# Patient Record
Sex: Male | Born: 1950 | Race: White | Hispanic: No | Marital: Married | Smoking: Former smoker
Health system: Southern US, Community
[De-identification: ages and names within clinical notes are randomized; demographics above are authoritative.]

## PROBLEM LIST (undated history)

## (undated) DIAGNOSIS — K219 Gastro-esophageal reflux disease without esophagitis: Secondary | ICD-10-CM

## (undated) DIAGNOSIS — I2510 Atherosclerotic heart disease of native coronary artery without angina pectoris: Secondary | ICD-10-CM

## (undated) DIAGNOSIS — Z8719 Personal history of other diseases of the digestive system: Secondary | ICD-10-CM

## (undated) DIAGNOSIS — K2270 Barrett's esophagus without dysplasia: Secondary | ICD-10-CM

## (undated) DIAGNOSIS — I10 Essential (primary) hypertension: Secondary | ICD-10-CM

## (undated) DIAGNOSIS — I209 Angina pectoris, unspecified: Secondary | ICD-10-CM

## (undated) DIAGNOSIS — Z8701 Personal history of pneumonia (recurrent): Secondary | ICD-10-CM

## (undated) DIAGNOSIS — E785 Hyperlipidemia, unspecified: Secondary | ICD-10-CM

## (undated) DIAGNOSIS — J189 Pneumonia, unspecified organism: Secondary | ICD-10-CM

## (undated) DIAGNOSIS — M545 Low back pain: Secondary | ICD-10-CM

## (undated) DIAGNOSIS — Z720 Tobacco use: Secondary | ICD-10-CM

## (undated) DIAGNOSIS — M109 Gout, unspecified: Secondary | ICD-10-CM

## (undated) HISTORY — PX: COLONOSCOPY: SHX174

## (undated) HISTORY — DX: Atherosclerotic heart disease of native coronary artery without angina pectoris: I25.10

## (undated) HISTORY — DX: Hyperlipidemia, unspecified: E78.5

## (undated) HISTORY — DX: Personal history of pneumonia (recurrent): Z87.01

## (undated) HISTORY — PX: UPPER GI ENDOSCOPY: SHX6162

## (undated) HISTORY — DX: Gastro-esophageal reflux disease without esophagitis: K21.9

## (undated) HISTORY — DX: Low back pain: M54.5

## (undated) HISTORY — PX: CARDIAC CATHETERIZATION: SHX172

## (undated) HISTORY — DX: Tobacco use: Z72.0

## (undated) HISTORY — DX: Barrett's esophagus without dysplasia: K22.70

---

## 2006-02-03 DIAGNOSIS — I2510 Atherosclerotic heart disease of native coronary artery without angina pectoris: Secondary | ICD-10-CM

## 2006-02-07 ENCOUNTER — Ambulatory Visit: Payer: Self-pay | Admitting: Cardiology

## 2006-02-07 ENCOUNTER — Inpatient Hospital Stay (HOSPITAL_COMMUNITY): Admission: EM | Admit: 2006-02-07 | Discharge: 2006-02-09 | Payer: Self-pay | Admitting: Cardiology

## 2006-02-21 ENCOUNTER — Ambulatory Visit: Payer: Self-pay | Admitting: Cardiology

## 2006-03-29 ENCOUNTER — Ambulatory Visit: Payer: Self-pay | Admitting: Cardiology

## 2006-05-16 ENCOUNTER — Ambulatory Visit: Payer: Self-pay | Admitting: Cardiology

## 2006-06-09 ENCOUNTER — Ambulatory Visit: Payer: Self-pay | Admitting: Cardiology

## 2006-06-15 ENCOUNTER — Ambulatory Visit: Payer: Self-pay | Admitting: Cardiology

## 2006-06-19 ENCOUNTER — Ambulatory Visit: Payer: Self-pay | Admitting: Cardiology

## 2006-06-20 ENCOUNTER — Ambulatory Visit: Payer: Self-pay | Admitting: Cardiology

## 2006-06-21 ENCOUNTER — Encounter: Payer: Self-pay | Admitting: Cardiology

## 2006-06-21 ENCOUNTER — Ambulatory Visit (HOSPITAL_COMMUNITY): Admission: RE | Admit: 2006-06-21 | Discharge: 2006-06-21 | Payer: Self-pay | Admitting: Cardiology

## 2006-06-21 ENCOUNTER — Ambulatory Visit: Payer: Self-pay | Admitting: Cardiology

## 2006-06-28 ENCOUNTER — Ambulatory Visit: Payer: Self-pay | Admitting: Cardiology

## 2006-07-19 ENCOUNTER — Encounter: Payer: Self-pay | Admitting: Cardiology

## 2006-09-28 ENCOUNTER — Ambulatory Visit: Payer: Self-pay | Admitting: Cardiology

## 2007-01-31 ENCOUNTER — Ambulatory Visit: Payer: Self-pay | Admitting: Cardiology

## 2007-08-08 ENCOUNTER — Ambulatory Visit: Payer: Self-pay | Admitting: Cardiology

## 2008-02-18 ENCOUNTER — Encounter: Payer: Self-pay | Admitting: Cardiology

## 2008-04-07 ENCOUNTER — Ambulatory Visit: Payer: Self-pay | Admitting: Cardiology

## 2008-07-17 ENCOUNTER — Encounter: Payer: Self-pay | Admitting: Cardiology

## 2008-07-24 ENCOUNTER — Encounter: Payer: Self-pay | Admitting: Cardiology

## 2008-07-24 ENCOUNTER — Ambulatory Visit: Payer: Self-pay | Admitting: Cardiology

## 2008-08-21 ENCOUNTER — Encounter: Payer: Self-pay | Admitting: Cardiology

## 2008-10-13 ENCOUNTER — Encounter: Payer: Self-pay | Admitting: Cardiology

## 2008-10-24 ENCOUNTER — Ambulatory Visit: Payer: Self-pay | Admitting: Gastroenterology

## 2008-11-20 ENCOUNTER — Telehealth: Payer: Self-pay | Admitting: Gastroenterology

## 2008-11-25 ENCOUNTER — Encounter: Payer: Self-pay | Admitting: Gastroenterology

## 2008-12-17 ENCOUNTER — Encounter: Payer: Self-pay | Admitting: Gastroenterology

## 2009-06-03 ENCOUNTER — Encounter: Payer: Self-pay | Admitting: Cardiology

## 2009-06-08 ENCOUNTER — Ambulatory Visit: Payer: Self-pay | Admitting: Cardiology

## 2009-06-08 ENCOUNTER — Encounter: Payer: Self-pay | Admitting: Physician Assistant

## 2010-01-14 ENCOUNTER — Encounter: Payer: Self-pay | Admitting: Cardiology

## 2010-06-29 ENCOUNTER — Telehealth (INDEPENDENT_AMBULATORY_CARE_PROVIDER_SITE_OTHER): Payer: Self-pay | Admitting: *Deleted

## 2010-07-12 ENCOUNTER — Encounter: Payer: Self-pay | Admitting: Cardiology

## 2010-07-14 ENCOUNTER — Encounter (INDEPENDENT_AMBULATORY_CARE_PROVIDER_SITE_OTHER): Payer: Self-pay | Admitting: *Deleted

## 2010-07-14 ENCOUNTER — Ambulatory Visit: Payer: Self-pay | Admitting: Cardiology

## 2010-10-05 NOTE — Progress Notes (Signed)
Summary: pain meds question  Phone Note Call from Patient   Caller: VOICEMAIL MESSAGE Summary of Call: Called questioning what else he can take besides Tylenol for back pain.  PMD given injection.  Also questioning if he can take Skelaxin.  Okay to leave message.    Notified via voice mail - MD will not advise on meds since not seen since 06/2009.  Can address at OV on 11/9.   Hoover Brunette, LPN  June 29, 2010 4:52 PM

## 2010-10-05 NOTE — Letter (Signed)
Summary: Stress Echocardiography  [facility] [address]   [place], [place] [address]   Phone: [phone]  Fax: [phone]      [facility] Cardiovascular Services  Stress Echocardiography    Letitia Libra  Appointment Date:_  Appointment Time:_   Your doctor has ordered a stress echo to help determine the condition of your heart during exercise. If you take blood pressure medication, ask your doctor if you should take it the day of your test. You should not have anything to eat or drink at least 4 hours before your test is scheduled.  You will be asked to undress from the waist up and given a hospital gown to wear, so dress comfortably from the waist down for example: Sweat pants, shorts, or skirt Rubber soled lace up shoes (tennis shoes)  You will need to register at the Outpatient/Main Entrance at the hospital 15 minutes before your appointment time. It is a good idea to bring a copy of your order with you. They will direct you to the Cardiovascular Department on the third floor.   Plan on about an hour and a half  from registration to release from the hospital

## 2010-10-05 NOTE — Assessment & Plan Note (Signed)
Summary: 1 YR FU PER OCT REMINDER -SRS   Visit Type:  Follow-up Primary Provider:  Allyson Sabal   History of Present Illness: patient had laboratory 2011. Creatinine is within normal limits.liver function tests are within normal limits. Cholesterol is 195, HDL is 55 LDL is 116potassium mildly elevated at 5.2. CBC is within normal limits and PSA was normal. patient previously had  Lyme serology done and recommend and both IgM,r IgG both were negative. patient has a history of coronary artery disease, with Cypher stent to the LAD and the diagonal lesion that's treated medically. Ecg OK nsr no changes.  increaseCrestor 20mg . Rd shift leg cramps. Standing on feet. Night cramps.  Stent placed 02/2006 Back pain. Now on Naproxen.  Sweating at night. TSH normal.  No scp. No sob.  Needs stress echo.  Will get back to [place]. Change amlodipine to qod. Going to stay with it.  Preventive Screening-Counseling & Management  Alcohol-Tobacco     Smoking Status: quit     Year Quit: 2000  Current Medications (verified): 1)  Metoprolol Succinate 50 Mg Xr24h-Tab (Metoprolol Succinate) .... One Tablet P.o. Daily 2)  Amlodipine Besylate 10 Mg Tabs (Amlodipine Besylate) .... One Tablet P.o. Daily 3)  Trilipix 135 Mg Cpdr (Choline Fenofibrate) .... Take 1 Capsule By Mouth Once A Day 4)  Nexium 40 Mg Cpdr (Esomeprazole Magnesium) .... Take 1 Tablet By Mouth Once A Day 5)  Crestor 20 Mg Tabs (Rosuvastatin Calcium) .... Take 1 Tab By Mouth At Bedtime 6)  Aspirin 81 Mg Tbec (Aspirin) .... Take 1 Tablet By Mouth Once A Day 7)  Fish Oil 1000 Mg Caps (Omega-3 Fatty Acids) .... Take 2 Tablet By Mouth Two Times A Day 8)  Multivitamins  Tabs (Multiple Vitamin) .... Take 1 Tablet By Mouth Once A Day 9)  Naproxen 500 Mg Tabs (Naproxen) .... Take 1 Tablet By Mouth Two Times A Day  Allergies (verified): No Known Drug Allergies  Comments:  Nurse/Medical Assistant: The patient's medication bottles and allergies  were reviewed with the patient and were updated in the Medication and Allergy Lists.  Past History:  Past Medical History: Last updated: 07/24/2008 coronary artery disease status post Cypher stent to the LAD with high-grade diagonal lesion treated medically normal LV function question statin intolerance hypertension resolved severe hypertriglyceridemia history of tobacco use  Family History: Last updated: 07/24/2008 noncontributory  Social History: Last updated: 07/24/2008 history of alcohol use and tobacco use  Risk Factors: Smoking Status: quit (07/14/2010)  Review of Systems  The patient denies fatigue, malaise, fever, weight gain/loss, vision loss, decreased hearing, hoarseness, chest pain, palpitations, prolonged cough, wheezing, sleep apnea, coughing up blood, abdominal pain, blood in stool, nausea, vomiting, diarrhea, heartburn, incontinence, blood in urine, muscle weakness, joint pain, leg swelling, rash, skin lesions, headache, fainting, dizziness, depression, anxiety, enlarged lymph nodes, easy bruising or bleeding, and environmental allergies.    Vital Signs:  Patient profile:   60 year old male Height:      70 inches Weight:      192 pounds BMI:     27.65 Pulse rate:   65 / minute BP sitting:   127 / 77  (left arm) Cuff size:   regular  Vitals Entered By: Carlye Grippe (July 14, 2010 9:58 AM)  Nutrition Counseling: Patient's BMI is greater than 25 and therefore counseled on weight management options.  Physical Exam  Additional Exam:  General: Well-developed, well-nourished in no distress head: Normocephalic and atraumatic eyes PERRLA/EOMI intact, conjunctiva  and lids normal nose: No deformity or lesions mouth normal dentition, normal posterior pharynx neck: Supple, no JVD.  No masses, thyromegaly or abnormal cervical nodes lungs: Normal breath sounds bilaterally without wheezing.  Normal percussion heart: regular rate and rhythm with normal S1 and  S2, no S3 or S4.  PMI is normal.  No pathological murmurs abdomen: Normal bowel sounds, abdomen is soft and nontender without masses, organomegaly or hernias noted.  No hepatosplenomegaly musculoskeletal: Back normal, normal gait muscle strength and tone normal pulsus: Pulse is normal in all 4 extremities Extremities: No peripheral pitting edema neurologic: Alert and oriented x 3 skin: Intact without lesions or rashes cervical nodes: No significant adenopathy psychologic: Normal affect    Impression & Recommendations:  Problem # 1:  ESSENTIAL HYPERTENSION, BENIGN (ICD-401.1) blood pressure well-controlled. The patient wanted to stop his amlodipine but I told him that this is not a good idea and ultimately he will continue this. His updated medication list for this problem includes:    Metoprolol Succinate 50 Mg Xr24h-tab (Metoprolol succinate) ..... One tablet p.o. daily    Amlodipine Besylate 10 Mg Tabs (Amlodipine besylate) ..... One tablet p.o. daily    Aspirin 81 Mg Tbec (Aspirin) .Marland Kitchen... Take 1 tablet by mouth once a day  Problem # 2:  PERCUTANEOUS TRANSLUMINAL CORONARY ANGIOPLASTY, HX OF (ICD-V45.82) the patient had a stent placed in 2007. This was a Cypher drug-eluting stent. He is due for a stress echocardiogram which will be scheduled in the next couple of weeks.  Problem # 3:  MIXED HYPERLIPIDEMIA (ICD-272.2) patient's LDL is not at goal. It is 528. We will increase Crestor to 20 mg p.o. q. daily. Followup liver function test and lipid panel in 6 months. His updated medication list for this problem includes:    Trilipix 135 Mg Cpdr (Choline fenofibrate) .Marland Kitchen... Take 1 capsule by mouth once a day    Crestor 20 Mg Tabs (Rosuvastatin calcium) .Marland Kitchen... Take 1 tab by mouth at bedtime  Patient Instructions: 1)  Stress Echo - call the office when ready to do test. 2)  Follow up in  6 months

## 2010-12-18 ENCOUNTER — Telehealth: Payer: Self-pay | Admitting: *Deleted

## 2010-12-18 DIAGNOSIS — I10 Essential (primary) hypertension: Secondary | ICD-10-CM

## 2010-12-18 NOTE — Telephone Encounter (Signed)
Lynden Ang (wife) calling for husband.  States wants to see Dr. Andee Lineman.  Husband having new problem with edema in ankles and feet.  Left message to return call on Monday.

## 2010-12-20 NOTE — Telephone Encounter (Signed)
Spoke with wife Lynden Ang).  States having edema from knees down x 3 weeks.  No SOB.  Does notice fatique more over last 3 weeks, but has also started working 10 hour days.  Wife stated that she has been giving him some of her HCTZ 25mg  - 2 tabs daily x 1 week and just changed to one tab daily recently.  Last seen in November.  Was suppose to call back when able to do stress echo, but has not had this done yet.  Had been having some back issues & not sure he could do the treadmill part of test.

## 2010-12-22 NOTE — Telephone Encounter (Signed)
Not much we can do over the phone. Patient can be scheduled for a dobutamine echocardiogram so he doesn't have to walk. He was seen in November for his appointment should be coming up soon.

## 2010-12-22 NOTE — Telephone Encounter (Addendum)
Wife Lynden Ang) notified in regards to scheduling test & then seeing MD.  I informed pt that i can not add anymore pt's to MD schedule without his approval.  Stated that she felt like husband should be able to see his cardiologist anytime they needed him.  Offered to go ahead and have dobutamine echo scheduled first, the MD could decide how to treat from there.  Wife declined test & OV and stated she would find another practice with more providers in it so he could be seen when he felt like he should be.  Wife was then transferred to discuss this further with our office manager Joanna Puff).    After call above, wife did call back to request written refill for Metoprolol ER 50mg  & Amlodipine 10mg .  Needs them to be 90 day supply for mail order.  Advised her that we would give one time (#90 tabs) only since she would be leaving the practice.  She verbalized understanding.  Will notify when she can pick up as she requests to mail herself instead of [place] sending them in electronically.

## 2010-12-23 MED ORDER — METOPROLOL SUCCINATE ER 50 MG PO TB24: 50.0000 mg | ORAL_TABLET | Freq: Every day | ORAL | Status: DC

## 2010-12-23 MED ORDER — AMLODIPINE BESYLATE 10 MG PO TABS: 10.0000 mg | ORAL_TABLET | Freq: Every day | ORAL | Status: DC

## 2011-01-18 NOTE — Assessment & Plan Note (Signed)
[facility]                          [facility] NOTE   NAME:[name]                     MRN:          [id number]  DATE:08/08/2007                            DOB:          1951/08/14    HISTORY OF PRESENT ILLNESS:  Patient is a 60 year old male with single-  vessel coronary artery disease.  Patient is status post stent placement  to the LAD in 2007.  He also has a high-grade diagonal lesion, which was  treated medically.  However, he has had no recurrence of chest pain and  is doing quite well.  He had lipid panel drawn recently and LDL was  decreased to 122 and Crestor 5 mg a day.  However, triglycerides were  increased to 248.  The latter is likely attributable to the fact that  patient started eating more carbohydrates again.  He also has not  exercised in quite a while.   MEDICATIONS:  1. Amlodipine 5 mg p.o. daily.  2. Aspirin 81 mg p.o. daily.  3. Crestor 5 mg p.o. daily.  4. Plavix 75 mg p.o. daily.  5. Nexium 40 mg p.o. daily.  6. Tricor 145 daily.  7. Fish oil 1000 mg twice a day.   PHYSICAL EXAMINATION:  VITAL SIGNS:  Blood pressure 145/93, heart rate  78 beats per minute, weight 186 pounds.  NECK EXAM:  Normal carotid upstroke, no carotid bruits.  LUNGS:  Clear breath sounds bilaterally.  HEART:  Regular rate and rhythm, normal S1, S2, no murmurs or gallops.  ABDOMEN:  Soft.  EXTREMITY EXAM:  No cyanosis, clubbing or edema.   PROBLEM LIST:  1. Coronary artery disease.      a.     No recurrence of substernal chest pain.      b.     Status post Cypher stent to the LAD with high-grade diagonal       lesion, treated medically.      c.     Normal LV function.  2. Tolerating statin drug therapy.  3. Hypertension, resolved.  4. Severe hypertriglyceridemia, on fish oil.  Add Tricor and low-carb      diet (noncompliant with __________ ).  5. History of tobacco abuse.   PLAN:  1. We discussed the patient's lipid  profile.  We are reducing Crestor      to 10 mg p.o. daily.  We have also asked him to exercise to lower      his triglycerides.  Also increased his fish oil to 40 g a day.  2. Patient, from a cardiovascular perspective, is stable and will      follow up with [place] in the clinic in      six months.  3. Lipid panel will be drawn in eight weeks.     Learta Codding, MD,FACC  Electronically Signed    GED/MedQ  DD: 08/08/2007  DT: 08/08/2007  Job #: [id number]

## 2011-01-18 NOTE — Assessment & Plan Note (Signed)
[facility]                          [facility]ICE NOTE   [name], [name]                     MRN:          [id number]  DATE:04/07/2008                            DOB:          1950-09-07    PRIMARY CARE PHYSICIAN:  Doreen Beam, MD   PRIMARY CARDIOLOGIST:  Learta Codding, MD, FACC   REASON FOR VISIT:  Scheduled followup.   HISTORY OF PRESENT ILLNESS:  I am seeing [name] in Dr. Margarita Mail  absence.  He is a pleasant 60 year old gentleman with coronary artery  disease status post drug-eluting stent placement to the left anterior  descending with medically managed high-grade stenosis involving a  diagonal branch.  He is doing well from the perspective of symptoms with  no significant angina or dyspnea on exertion.  His main complaint has  been a joint pain.  He has ultimately determined that it is likely  related to his Crestor.  He even had blood work done back in April to  investigate inflammatory arthritis, but had a normal RA factor and  normal ANA.  His sed rate was also normal at 2.  He has already backed  down his Crestor dose from 10 mg to 5 mg back in June and did have  followup blood work at that time showing normal liver function tests  with a total cholesterol of 189, triglycerides of 64, HDL 64, and LDL  112.  Actually, most of these numbers are much better following a  decrease in his carbohydrates as well as increase in omega-3  supplements.  His LDL unfortunately is not at goal, although better than  it was.  We talked about this some today.  He previously did not  tolerate Vytorin.   ALLERGIES:  No known drug allergies.   PRESENT MEDICATIONS:  1. Aspirin 81 mg p.o. daily.  2. Amlodipine 5 mg p.o. daily.  3. Plavix 75 mg p.o. daily.  4. Nexium 40 mg p.o. daily.  5. TriCor 145 mg p.o. daily.  6. Omega-3 supplements 2 g p.o. b.i.d.  7. Crestor 5 mg p.o. daily.  8. Sublingual nitroglycerin 0.4 mg p.r.n.   REVIEW  OF SYSTEMS:  As described in history of present illness.  Otherwise negative.   PHYSICAL EXAMINATION:  VITAL SIGNS:  Blood pressure is 134/80, heart  rate is 78, and weight is 174 pounds.  GENERAL:  The patient is comfortable and in no acute distress.  HEENT:  Conjunctivae are normal.  Oropharynx is clear.  NECK:  Supple.  No elevated jugular venous pressure.  No loud bruits or  thyromegaly.  LUNGS:  Clear without labored breathing at rest.  CARDIAC:  Regular rate and rhythm.  No rub, murmur, or gallop.  ABDOMEN:  Soft and nontender.  Normoactive bowel sounds.  EXTREMITIES:  Exhibits no significant pitting edema.  Distal pulses are  2+.  SKIN:  Warm and dry.  MUSCULOSKELETAL:  No kyphosis noted.  NEUROPSYCHIATRIC:  The patient is alert and oriented x3.  Affect is  appropriate.   IMPRESSION AND RECOMMENDATIONS:  1. Cardiovascular disease status post drug-eluting stent placement to  the proximal left anterior descending with medically managed high-      grade diagonal stenosis, symptomatically very stable on medical      therapy.  Left ventricular ejection fraction has been assessed in      the normal range.  At this point, we will plan to continue present      antianginal regimen and risk factor modification strategies.  2. Hyperlipidemia with history of intolerance to Vytorin and now      Crestor, recent symptoms being predominately joint discomfort.  We      talked about this a fair bit today.  He will be transitioned from      TriCor to Trilipix 135 mg daily, continue on 4 g of omega-3      supplements daily, and after a 2-week statin hiatus will begin      Lipitor 20 mg p.o. nightly instead of Crestor.  Hopefully, he will      be able to tolerate this.  If so, he will have a followup lipid      profile and liver function tests over the next 8-12 weeks with Dr.      Sherril Croon, and we can reassess from there.  3. Regular followup will be with Dr. Andee Lineman over the next 6  months.     Jonelle Sidle, MD  Electronically Signed    SGM/MedQ  DD: 04/07/2008  DT: 04/08/2008  Job #: [id number]   cc:   Doreen Beam, MD  Learta Codding, MD,FACC

## 2011-01-18 NOTE — Consult Note (Signed)
NAME:  [name], [name] NO.:  [id number]   MEDICAL RECORD NO.:  [id number]          PATIENT TYPE:  AMB   LOCATION:  DAY                           FACILITY:  [facility]   PHYSICIAN:  Kassie Mends, M.D.      DATE OF BIRTH:  August 01, 1951   DATE OF CONSULTATION:  10/24/2008  DATE OF DISCHARGE:                                 CONSULTATION   REASON FOR CONSULTATION:  Dysphagia.   PHYSICIAN REQUESTING CONSULTATION:  Doreen Beam, MD   HISTORY OF PRESENT ILLNESS:  The patient is a very pleasant 60 year old  gentleman who complains of a several-week history of dysphagia primarily  the pills.  He states he feels his pills are getting stuck in his upper  esophagus.  He notes one particular pill seems to cause him most  problems, but he cannot recall the name of it.  He believes this is  blood pressure pill.  He denies any pain in the area.  He also complains  of some sinusitis issues and wonders if this is causing him to have a  sensation.  When he eats the food, food seems to go down okay.  He is on  chronic Nexium since he has been on Plavix 2 years ago.  He denies any  heartburn, nausea, vomiting, abdominal pain, constipation, diarrhea,  melena, or rectal bleeding.   CURRENT MEDICATIONS:  1. Plavix 75 mg daily.  2. Nexium 40 mg daily.  3. Norvasc 10 mg daily.  4. Metoprolol 50 mg daily.  5. Crestor 10 mg daily.  6. Trilipix 135 mg daily.  7. Aspirin 81 mg daily.  8. Fish oil 2 b.i.d.  9. Carafate q.i.d.   ALLERGIES:  No known drug allergies.   PAST MEDICAL HISTORY:  GERD, hypercholesterolemia, CAD status post stent  2 years ago, colonoscopy a year ago by Dr. Karilyn Cota, states he had a small  polyp and was told to come back in 6-7 years.   FAMILY HISTORY:  Significant for CAD.  No family history of colorectal  cancer.   SOCIAL HISTORY:  He is married.  He currently is laid off from [employer]  in [place].  He is a nonsmoker, occasionally consumes beer.   REVIEW OF SYSTEMS:   See HPI for GI.  Constitutional:  No weight loss.  Cardiopulmonary:  No chest pain, shortness of breath, palpitations, or  cough.  Genitourinary:  No dysuria or hematuria.   PHYSICAL EXAMINATION:  VITAL SIGNS:  Weight 188, height 5 feet 10  inches, temp 98.2, blood pressure 158/80, and pulse 80.  GENERAL:  A pleasant well-nourished, well-developed Caucasian gentleman  in no acute distress.  SKIN:  Warm and dry.  No jaundice.  HEENT:  Sclerae nonicteric.  Oropharyngeal mucosa moist and pink.  No  lesions, erythema, or exudate.  No lymphadenopathy or thyromegaly.  CHEST:  Lungs are clear to auscultation.  CARDIAC:  Regular rate and rhythm.  No murmurs, rubs, or gallops.  ABDOMEN:  Positive bowel sounds.  Abdomen is soft, nontender, and  nondistended.  No organomegaly or masses.  No rebound or guarding.  No  abdominal bruits or hernias.  LOWER EXTREMITIES:  No edema.   IMPRESSION:  [name] is a 60 year old gentleman with a several-week  history of dysphagia primarily to pills.  He feels like pills are  getting stuck in his upper esophagus.  He denies any odynophagia.  He  also complains of sinusitis.  It may be that we are dealing with more or  less globus hystericus, but cannot exclude upper esophageal web or  Zenker diverticulum, etc.   PLAN:  1. EGD by Dr. Cira Servant in the near future.  We will have him hold his      Plavix and aspirin for 4 days prior to procedure.  2. Further recommendations to follow.  3. I would like to thank Dr. Sherril Croon for allowing [place] to take part in the      care of this patient.   ADDENDUM 84696:  EGD not done as of 31810. Will call pt to [facility].      Tana Coast, P.A.      Kassie Mends, M.D.  Electronically Signed    LL/MEDQ  D:  10/24/2008  T:  10/25/2008  Job:  [id number]   cc:   Doreen Beam, MD  Fax: [phone]

## 2011-01-18 NOTE — Assessment & Plan Note (Signed)
[facility]                          [facility]ICE NOTE   NAME:[name], [name]                     MRN:          [id number]  DATE:01/31/2007                            DOB:          [birth year]    HISTORY OF PRESENT ILLNESS:  Patient is a 60 year old male with history  of single vessel coronary artery disease.  The patient presents for  followup.  He has been doing quite well.  He reports no substernal chest  pain, shortness of breath, orthopnea, or PND.  He is angina free now  that he is taking the addition of amlodipine.  He does have high-grade  residual lesion in the diagonal branch with a patent stent to the LAD.  The patient has placed himself on a low-carb diet, and his triglycerides  are now within normal range; it measures at 104 mg%.  His LDL is  elevated at 168.  However, previously due to his very high  triglycerides, his LDL was not measured.   MEDICATIONS:  1. Tricor 145 mg p.o. daily.  2. Plavix 75 mg p.o. daily.  3. Fish oil 1000 mg p.o. daily.  4. Nexium 40 mg p.o. daily.  5. Aspirin 81 mg a day.  6. Amlodipine 5 mg p.o. daily.   PHYSICAL EXAMINATION:  VITAL SIGNS:  Blood pressure is 131/85.  Heart  rate 63.  He weighs 180 pounds.  NECK EXAM:  Normal carotid upstroke.  No carotid bruits.  LUNGS:  Clear breath sounds bilaterally.  HEART:  Regular rate and rhythm with normal S1 and S2.  No murmurs,  rubs, or gallops.  ABDOMEN:  Soft and non-tender.  No rebound or guarding.  EXTREMITY EXAM:  No cyanosis, clubbing, or edema.  NEUROLOGIC:  Patient is alert, oriented, and grossly nonfocal.   PROBLEM LIST:  1. Coronary artery disease.      a.     No recurrence of substernal chest pain.      b.     Status post Cypher stent to the left anterior descending       with residual high-grade lesion in the diagonal branch, treated       medically.  2. Previous weakness secondary to high-dose statin therapy.  3. Hypertension,  resolved.  4. Severe hypertriglyceridemia, resolved on fish oil, Tricor, and low-      carb diet.  5. History of tobacco use.  6. History of __________ .   PLAN:  1. Patient is doing extremely well.  He has no recurrent angina.  2. We started a prescription of low-dose Crestor 5 mg p.o. daily.  3. The patient will have followup labs done in 3 to 6 months.     Learta Codding, MD,FACC  Electronically Signed    GED/MedQ  DD: 01/31/2007  DT: 01/31/2007  Job #: [id number]

## 2011-01-21 NOTE — Cardiovascular Report (Signed)
NAME:  CREEK, GAN NO.:  [id number]   MEDICAL RECORD NO.:  [id number]          PATIENT TYPE:  INP   LOCATION:  2807                         FACILITY:  [facility]   PHYSICIAN:  Arturo Morton. Riley Kill, M.D. [facility] OF BIRTH:  03-23-1951   DATE OF PROCEDURE:  02/08/2006  DATE OF DISCHARGE:                              CARDIAC CATHETERIZATION   INDICATIONS:  Mr. Travaughn Vue is a 60 year old gentleman, father of  one, a weaver for [employer], who presented with negative troponins.  The patient had had some recent chest pain.  It was felt to be recent onset  unstable angina, and he had significant hypertriglyceridemia.  Based on  this, Dr. Myrtis Ser saw the patient and referred him for cardiac catheterization.   PROCEDURES:  1.  Left heart catheterization.  2.  Selective coronary arteriography.  3.  Selective left ventriculography.  4.  Percutaneous coronary intervention using a drug-eluting stent in the      left anterior descending artery.   DESCRIPTION OF PROCEDURE:  The patient was brought to the catheterization  laboratory and prepped and draped in the usual fashion.  Through an anterior  puncture, the right femoral artery was easily entered.  We initially used 5-  [place] catheters.  Views of the left and right coronaries were obtained in  multiple angiographic projections.  Central aortic and left ventricular  pressures were measured with the pigtail.  Ventriculography was performed in  the RAO projection.  The patient had evidence of high-grade disease  involving an important left anterior descending artery, compatible with his  unstable angina.  He had mild irregularities of the circumflex and normal  right coronary artery.  His overall LV function was well-preserved.  It then  discussed with the patient in some detail the approach.  We discussed  percutaneous intervention and/or revascularization surgery.  I also  discussed this with his wife.  We also  discussed, as reasonably as could be  achieved, the choice of the stents with both drug-eluting a non drug-eluting  platforms.  Given the patient's LAD, reasonable length and somewhat smaller  caliber vessel, it was felt that in this situation a drug-eluting stent  would be the most ideal option if it could be achieved, given the small size  of the vessel.  We discussed this in detail, and we elected to proceed.   The patient was given oral clopidogrel, at first 300 mg, then followed by an  additional 300 mg.  Following this, bivalirudin was given according to  protocol.  The 5-French sheath was upgraded to a 6-French sheath.  A JL-30  guiding catheter was utilized.  We were able to get a Prowater wire down the  vessel, and predilatations were done with both 20 and 2.25 mm balloons.  We  then carefully debated whether or not to lay a drug-eluting stent across the  side branch.  The side branch was not significantly compromised, but the  angle was not steep.  With this, a 28 x 2.5 Cypher drug-eluting stent was  placed in the left anterior descending artery.  This was then  deployed  carefully at 14 atmospheres.  There was dramatic improvement in the  appearance of the artery.  There was a change in the side branch from about  30% to about 70% narrowing at the ostium, but he maintained good TIMI III  flow, and the side branch itself it is not nearly the size of the left  anterior descending artery.  We elected to post dilate the stent then with a  Quantum Maverick 2.75-mm balloon.  The distal-most aspect of the stent  overlying the side branch was dilated up to about 8-10 atmospheres.  As the  balloon was pulled back into the stent that was not covering the side  branch, we deployed at about 12-14 atmospheres with the post dilatation  balloon.  There was really marked improvement in the appearance of the  artery and what appeared to be an excellent angiographic result.  There was  TIMI III  flow.  There was no evidence of hematoma.  All catheters were  subsequently removed and the femoral sheath was sewn into place.  He was  taken to the holding area for subsequent transferred to the post angioplasty  care unit.  I reviewed the films with his wife in detail.   HEMODYNAMIC DATA:  1.  Central aortic pressure 124/73.  2.  Left ventricular pressure 122/12.  3.  No gradient pullback across aortic valve.   ANGIOGRAPHIC DATA:  1.  Ventriculography was done in the RAO projection.  Overall systolic      function was preserved.  No segmental abnormalities or contraction were      identified.  2.  The right coronary artery was free of significant disease of the large      caliber vessel providing posterior descending and a tiny posterolateral      system.  3.  The left main coronary artery was free of critical disease.  4.  The left anterior descending artery provides a first diagonal.  It is      somewhat small in caliber.  Just after this, there is about 70%      narrowing and then subtotal occlusion just prior to the diagonal      takeoff.  The diagonal takeoff itself has about 30% narrowing, and the      area in the LAD just distal to this has probably from 40-50% narrowing,      although it is somewhat difficult to gauge, given the bifurcation nature      of the lesion.  The distal LAD wraps the apical tip.  5.  The circumflex is a fairly large vessel with about 30% proximal      narrowing and then a marginal branch with about 20-30% proximal      narrowing in the AV circumflex without critical disease.  Following the      stent procedure, the segmental area of disease in the left anterior      descending artery is reduced to 0% with an excellent angiographic      appearance.  There is some pinching of the side branch from about 30% up      to about 70%, but there is maintained TIMI III flow without critical     narrowing or slow flow.  Based on this, it was felt that parent  vessel      technique was the best option and subsequent dilatation of the diagonal      not needed.   CONCLUSION:  1.  Successful percutaneous  stenting of subtotally occluded left anterior      descending artery in the setting of highly unstable angina using a drug-      eluting platform.  2.  Well-preserved overall left ventricular function.  3.  Significant hypertriglyceridemia.   DISPOSITION:  1.  Recommendations for aggressive approach to the hypertriglyceridemia.  2.  Aspirin and Plavix for a minimum of 1 year, and then at the discretion      of the primary cardiologist thereafter, with continued aspirin use.  3.  Aggressive risk factor reduction.      Arturo Morton. Riley Kill, M.D. [facility]  Electronically Signed     TDS/MEDQ  D:  02/08/2006  T:  02/09/2006  Job:  [id number]   cc:   Willa Rough, M.D.  [address]  [address]  [place]  [place] [address]   CV Laboratory   Doreen Beam  Fax: [phone]

## 2011-01-21 NOTE — Assessment & Plan Note (Signed)
[facility]                            [facility] OFFICE NOTE   Theador, [name]                     MRN:          [id number]  DATE:06/09/2006                            DOB:          1950-12-01    PRIMARY CARDIOLOGIST:  Luis Abed, MD, [facility]   REASON FOR OFFICE VISIT:  Scheduled follow-up.   [name] returns following most recent post hospital visit on June 19, at  which time he returned to Roxanne Mins, PA-C, after undergoing cardiac  catheterization revealing subtotal occlusion of the LAD in the setting of  unstable angina pectoris.  He was treated with Cypher stenting.  Left  ventricular function was normal.   Recommendation was to treat with Plavix for at least 1 year.   The patient also is noted to have significant hypertriglyceridemia - he was  on both Tricor/Fish oil, and has since been placed on Vytorin by myself for  more aggressive management of LDL.   Since last seen, the patient continues to report no exertional angina  pectoris as he had prior to his catheterization.  However, he has been  plagued by significant fatigue and diminished energy.  He also has been  having difficulty with impotence.   The patient has not smoked in 7 years.   CURRENT MEDICATIONS:  1. Plavix.  2. Aspirin 325 daily.  3. Lopressor 25 b.i.d.  4. TriCor 145 daily.  5. Fish Oil 1000 mg daily.  6. Nexium.   PHYSICAL EXAMINATION:  VITAL SIGNS:  Blood pressure 102/62, pulse 60 and  regular, weight 178.  GENERAL:  A 60 year old male in no apparent distress.  HEENT:  Normocephalic and atraumatic.  NECK:  Palpable bilateral carotid pulses without bruits.  LUNGS:  Clear to auscultation in all fields.  HEART:  Regular rate and rhythm (S1 and S2).  No significant murmurs.  EXTREMITIES:  Palpable distal pulses without edema.  NEUROLOGY:  No focal deficits.   IMPRESSION:  1. Single vessel coronary artery disease.      a.     Status post  Cypher stenting subtotal occlusion left anterior       descending June 2007.      b.     Normal left ventricular function.  2. Fatigue.      a.     Suspect secondary to hypotension.  3. Hypotension.  4. Severe hypertriglyceridemia.      a.     On Fish Oil/TriCor and Vytorin.  5. History of tobacco.  6. Impotence.   PLAN:  1. Decrease Lopressor to 12.5 b.i.d. to allow both the blood pressure and      the pulse to increase.  I suspect that this will greatly improve his      current lack of energy and easy fatigability.  This may also help      ameliorate his current symptoms with probable impotence.  Of note, I      will probably wean him off the beta blocker given that there is no      objective data for him to be on longterm beta  blocker.  2. Schedule a follow-up fasting lipid/liver profile given the recent      addition of Vytorin, following my review of his lipid profile in June      of this year.  3. Decrease aspirin to 81 mg daily.  The patient does report some easy      bruising on the combination of full dose aspirin/Plavix.  However, he      does know that he is to remain on low dose aspirin indefinitely and to      continue on Plavix for at least 1 year.  4. Return to clinic to follow up with me in 2 weeks for reassessment of      his symptoms, blood pressure, and heart rate, as well as review of his      lipid profile.      ______________________________  Rozell Searing, PA-C    ______________________________  Learta Codding, MD,FACC    GS/MedQ  DD:  06/09/2006  DT:  06/12/2006  Job #:  [id number]   cc:   Doreen Beam

## 2011-01-21 NOTE — Cardiovascular Report (Signed)
NAME:  [name], DELILLO NO.:  [id number]   MEDICAL RECORD NO.:  [id number]          PATIENT TYPE:  OIB   LOCATION:  2899                         FACILITY:  [facility]   PHYSICIAN:  Arturo Morton. Riley Kill, MD, FACCDATE OF BIRTH:  13-Feb-1951   DATE OF PROCEDURE:  06/21/2006  DATE OF DISCHARGE:  06/21/2006                              CARDIAC CATHETERIZATION   INDICATIONS:  Mr. Bialy is a very delightful 60 year old well-known to me.  He underwent percutaneous stenting of the left anterior descending artery.  At that time, the patient had some modest compromise particularly of the  second diagonal.  However, it was not flow limiting and the decision was  made to treat the parent vessel and not do bifurcation stenting because of  obvious reasons.  With this, the patient has done reasonably well until  recently.  Because of fatigue his beta blockers were dropped.  Now with  exercise, the patient has noted some chest tightness and limitation of  activity.  A radionuclide imaging study revealed anterolateral myocardial  perfusion defect, and I have discussed this with Dr. Andee Lineman.  It does not  appear to involve the septal apical region.  The patient had a drug-eluting  platform placed in the LAD.  He has continued stay on Plavix.  The current  study was done to reassess his anatomy.   PROCEDURE:  1. Left heart catheterization.  2. Selective coronary arteriography.  3. Selective left ventriculography.   DESCRIPTION OF PROCEDURE:  The patient was brought to the catheterization  laboratory and prepped and draped in the usual fashion.  Through an anterior  puncture, the right femoral artery was easily entered and a 5-French sheath  was placed.  Views of the left and right coronary arteries were then  obtained in multiple angiographic projections.  Central aortic and left  ventricular pressures were measured with a pigtail.  Ventriculography was  performed in the RAO projection.  We  then reviewed all the images with the  patient in the laboratory, and subsequently with his wife in the viewing  area.  I discussed his case with Dr. Andee Lineman in particular, who had reviewed  his radionuclide study and thought that the perfusion defect could be  accounted for by the diagonal involvement.  Because of this, the decision  was made to elect to treat the patient medically and he was taken off the  table and sent to the recovery area in satisfactory clinical condition.   HEMODYNAMIC DATA:  1. Central aortic pressure 125/71, mean 93.  2. Left ventricular pressure 131/21.  3. No gradient on pullback across the aortic valve.   ANGIOGRAPHIC DATA:  1. Ventriculography was done in the RAO projection.  LV function was      vigorous without a segmental wall motion abnormality.  2. The right coronary artery is a large-caliber vessel.  It provides a      large posterior descending branch which appears to be widely patent      without significant focal narrowing.  3. The left main is free of critical disease.  4. The LAD has about  30% proximal narrowing.  There is some mild      hypodensity in one LAO view but I believe this is related to overlap.      Multiple LAO views were obtained, and there does not appear to be high-      grade focal narrowing at the site.  The stent itself is widely patent,      encompassing both the first and second diagonals.  The first diagonal      is a small diagonal.  The second one is moderate.  Both have some      ostial narrowing.  The first diagonal comes out right at the edge of      the stent, and has probably about 80% narrowing but does not appear to      be more than about a 1.5-mm vessel.  The second diagonal is a somewhat      more robust vessel, and has about a 2-mm to 2.5-mm size with an ostial      stenosis of 70-80% and courses out over the anterolateral segment.  5. The circumflex coronary artery is a moderate-size vessel.  It provides       basically three marginal branches, all of which appear to be free of      critical disease.  There is perhaps minimal plaquing at the ostium of      both the main branch and the large marginal branch, but it did not      appear to be critical.   CONCLUSIONS:  1. Preserved left ventricular function.  2. Continued wide patency of the Cypher stent to the left anterior      descending artery without significant focal recurrence.  3. Mild plaquing of the proximal left anterior descending artery that does      not appear to be critical.  4. An 80% narrowing in the small first diagonal, and 70-80% narrowing of      the moderate-size second diagonal.   DISPOSITION:  I have reviewed the films very carefully and discussed them at  length with Dr. Andee Lineman.  The anterolateral segment may be the area of  involvement on radionuclide imaging with redistribution defect in this area.  Based on this, my inclination would be to recommend probably continued  medical therapy with increase in his beta blockade.  The symptoms seemed to  occur at the time that his beta blockers were dropped.  An alternative would  be revascularizations surgery to the diagonal alone, or possibly dilatation  of the side branch.  Importantly, normally one would recommend kissing-  balloon stenting of the ostium, but this was then involve balloon dilatation  in the main vessel with the drug no longer available for prevention of  restenosis.  Given all of these concerns, we will given him a few weeks to  see how he does.  Dr. Andee Lineman will see him back in followup within a week.      Arturo Morton. Riley Kill, MD, [facility]  Electronically Signed     TDS/MEDQ  D:  06/21/2006  T:  06/23/2006  Job:  [id number]   cc:   Learta Codding, MD,FACC  Luis Abed, MD, [facility]  CV Laboratory

## 2011-01-21 NOTE — Assessment & Plan Note (Signed)
[facility]                            [facility]ICE NOTE   [name], [name] [name]                     MRN:          [id number]  DATE:06/15/2006                            DOB:          [birth year]    PRIMARY CARDIOLOGIST:  Luis Abed, MD   REASON FOR OFFICE VISIT:  The patient is seen as a work-in today for  evaluation of persistent weakness and generalized fatigue.  Please refer to  my office note of October 5 for full details.   [name] continues to feel sluggish and tired all the time despite the  fact that he has taken my recommendation to cut back on the Lopressor to one-  half the dose (12.5 mg b.i.d.).  Of note, he states that he initially felt  better after doing this but has since reverted back to his generalized  fatigue state, which he has been experiencing for the past 4 weeks.   Although the patient reports no difficulty in getting to sleep after  completing his second shift, he finds himself going back to bed to sleep for  several more hours after waking up in the morning.  Regarding chest pain, he  had some yesterday which was intermittent but with no clear precipitant.  It  is different from that which precipitated his percutaneous intervention in  June.  It waxes and wanes and can also occur at various times, such as when  bending over.  He is not taking anything for this, including the  nitroglycerin which he has.   CURRENT MEDICATIONS:  As noted on October 5 (except Lopressor 12.5 mg  b.i.d.).   An electrocardiogram today reveals NSR at 60 BPM with no ischemic changes.   PHYSICAL EXAMINATION:  VITAL SIGNS:  128/66, pulse 60, regular, weight  179.6.  GENERAL:  A 60 year old male in no apparent distress.  NECK:  Palpable bilateral carotid pulses without bruits.  LUNGS:  Clear to auscultation in all fields.  CARDIAC:  Regular rate and rhythm (S1 and S2), no significant murmurs.  EXTREMITIES:  Palpable pulse  without edema.  NEUROLOGIC:  Flat affect but no focal deficits.   IMPRESSION:  1. Generalized fatigue.      a.     Unclear etiology.  2. Single-vessel coronary artery disease.      a.     Status post Cypher stenting, subtotal occlusion of left anterior       descending artery, June 2007.      b.     Normal left ventricular function.  3. Significant hypertriglyceridemia.      a.     On trimodal therapy, including Vytorin.  4. Hypotension.      a.     Improved with down-titration of Lopressor.  5. Impotence.   PLAN:  [name] returns with complaints of symptoms which are quite vague.  On the one hand, he seems to have noticed some improvement since I asked him  to cut back on the Lopressor, which I feel he does not have any clear-cut  indication for being on this medication.  Of  note, his blood pressure has  improved from one week ago and I thought that this would have helped him  feel better.  His pulse, however, is unchanged.   Given his wife's concern about the episode of chest pain yesterday and the  possibility that this generalized fatigue may possibly represent an anginal  equivalent (although I feel that this is low probability), I have  recommended that we schedule an exercise stress Cardiolite for further  evaluation.  The patient and his wife are quite agreeable with this plan.   We will also check complete blood work with a BMET, CBC/differential, and a  TSH level.  His only recent labs are notable for a lipid profile, which was  normal.   The patient is to keep his previously-scheduled follow-up with me on October  24.  We will discuss the results of his stress test at that time and make  further recommendations.      ______________________________  Rozell Searing, PA-C    ______________________________  Learta Codding, MD,FACC    GS/MedQ  DD:  06/15/2006  DT:  06/17/2006  Job #:  [id number]

## 2011-01-21 NOTE — Assessment & Plan Note (Signed)
[facility]                          [facility]ICE NOTE   [name], [name]                     MRN:          [id number]  DATE:04/18/2008                            DOB:          12-27-1950    PRIMARY CARE PHYSICIAN:  Doreen Beam, MD   PRIMARY CARDIOLOGIST:  Learta Codding, MD, [facility]   [facility].  Attention:  Appeals Department, [address], [place], [place]  [address] (fax [phone])   Our office recently received a fax from [facility] regarding the  patient, [name], and a denial for drug therapy, specifically  Lipitor 20 mg daily.  The reason for this denial is indicated that step  therapy with a generic statin is required first.   I recently saw [name] in Dr. Margarita Mail absence on April 07, 2008.  He  has cardiovascular disease, status post previous drug-eluting stent  placement to the proximal left anterior descending and has a medically  managed high grade diagonal stenosis as well.  Complicating his history  is long-term hyperlipidemia and a fairly well documented history of  intolerance to Vytorin (which obviously includes Zocor), and most  recently Crestor.  He has had problems with myalgias and joint  discomfort.  In light of this and a need to aggressively manage his LDL  from the perspective of further cardiac risk reduction, we elected to  begin a trial of Lipitor 20 mg daily to see if he would tolerate this  medication.  Lipitor should be covered for this gentleman particularly  if he tolerates it, as he has already tried essentially 3 separate  statin preparations.  I suppose generics of Pravachol or Mevacor could  be considered, although to attain an LDL around 70, he is likely going  to need a more potent statin medication, which was the reason for  picking Lipitor in the first place.   [name] has a followup scheduled visit with Dr. Andee Lineman over the next  6 months.     Jonelle Sidle, MD  Electronically Signed    SGM/MedQ  DD: 04/18/2008  DT: 04/19/2008  Job #: [id number]   cc:   Learta Codding, MD,FACC  Doreen Beam, MD

## 2011-01-21 NOTE — Discharge Summary (Signed)
NAME:  [name], [name] NO.:  [id number]   MEDICAL RECORD NO.:  [id number]          PATIENT TYPE:  INP   LOCATION:  6527                         FACILITY:  [facility]   PHYSICIAN:  Arturo Morton. Riley Kill, M.D. Romualdo Bolk OF BIRTH:  May 02, 1951   DATE OF ADMISSION:  02/07/2006  DATE OF DISCHARGE:  02/09/2006                                 DISCHARGE SUMMARY   PHYSICIANS:  Cardiologist is Dr. Myrtis Ser.  The primary care physician is Dr.  Doreen Beam in [place], [place].   DISCHARGE DIAGNOSES:  1.  Chest pain/coronary artery disease.  Negative cardiac enzymes, status      post cardiac catheterization, resulting in successful percutaneous      stenting of subtotally occluded left anterior descending artery in the      setting of highly unstable angina using a drug-eluting platform.  Well-      preserved overall left ventricular function.  2.  Significant hypertriglyceridemia.   PAST MEDICAL HISTORY:  Past medical history includes:  1.  A remote history of tobacco use.  2.  Remote history of moderate ETOH consumption.  3.  History of hyperlipidemia with report of triglycerides being over 1000      in the past.   PROCEDURE:  Procedures this admission include cardiac catheterization on  February 08, 2006.  Results as stated above.   HOSPITAL COURSE:  [name] is a 60 year old Caucasian gentleman with a  well-known history of coronary artery disease who initially presented to  [facility] with complaints of chest pain.  Cardiac enzymes  were negative for acute myocardial infarction.  Dr. Myrtis Ser saw the patient in  consultation.  Dr. Myrtis Ser felt the patient's symptoms were worrisome with  angina.  The patient was transferred to [facility] for further evaluation.  The patient was started on Tricor, aspirin, nitrates, beta blocker and  heparin.  Took the patient to the cath lab on February 08, 2006.  Results as  stated above.  The patient tolerated the procedure without  complications  with recommendations for Plavix one year minimum, aspirin indefinitely.  Post-catheterization, vital signs were stable.  Cath site without problems.  Dr. Riley Kill had a long talk with the patient regarding ETOH use and his  triglycerides.  Cardiac rehabilitation also was in to see the patient prior  to discharge.  At the time of discharge, the patient was afebrile.  Blood  pressure 130/72.  The patient's saturation was 97 percent on room air.  The  patient's potassium was 3.5, supplemented with p.o. potassium prior to  discharge.   DISCHARGE MEDICATIONS:  At the time of discharge, the patient has been given  prescriptions for:  1.  Tricor 145 mg daily.  2.  Plavix 75 mg daily.  3.  Lopressor 25 mg AM and PM.  4.  Protonix 40 mg daily.  5.  Nitroglycerin sublingually if needed.   DISCHARGE INSTRUCTIONS:  He has been given the post-cardiac catheterization  discharge instructions.  He may return to work on February 15, 1006.  He has  been instructed to:  1.  Limit his alcohol  consumption.  2.  Begin an exercise program.  3.  Decrease the fat and cholesterol in his diet.   He has a follow-up appointment with Dr. Myrtis Ser for February 21, 2006 at 2:30.  If  he needs to reschedule or he had any problems with his cath site, he is  instructed to call [phone].  I have also scheduled him for blood work.  He  needs lipids and LFTs in four to six weeks from the time of discharge.   Duration of discharge encounter:  30 minutes.      Dorian Pod, NP      Arturo Morton. Riley Kill, M.D. [facility]  Electronically Signed    MB/MEDQ  D:  03/06/2006  T:  03/06/2006  Job:  [id number]   cc:   Doreen Beam  Fax: [phone]

## 2011-01-21 NOTE — Assessment & Plan Note (Signed)
[facility]                            [facility] OFFICE NOTE   [name], [name]                     MRN:          [id number]  DATE:06/20/2006                            DOB:          1951-05-28    REFERRING PHYSICIAN:  Luis Abed, MD, [facility]   REFERRING PHYSICIANS:  1. Dr. Myrtis Ser.  2. Dr. Sherril Croon.   HISTORY OF PRESENT ILLNESS:  The patient is a 60 year old with a history of  single-vessel coronary artery disease.  The patient is status post Cypher  stenting secondary to subtotal occlusion of the left anterior descending  artery in June 2007 in the setting of unstable angina pectoris.  Please see  details regarding his procedure per Dr. Rosalyn Charters note from that date.  The  patient, initially after his procedure, did quite well.  His LV function was  preserved.  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  The patient, however, was recently seen on 2 separate  occasions, on June 09, 2006 and October 16, 2005, in the office due to  decreased exercise tolerance and substernal chest pressure on exertion.  It  was initially felt that the patient may be deconditioned and possibly have  lack of energy and easy fatigability secondary to his beta blocker.  His  dose was decreased by Gene Serpe, PA-C.  However, on the second visit, the  patient did complain of substernal chest pain and he was set up for an  exercise Cardiolite study.  The study was markedly positive with chest pain  and EKG changes consistent with ischemia.  The patient also had marked large  anterior fully reversible defect.  His ejection fraction, however, was  preserved at 61%.  No definite scar was seen.  I called the patient and his  wife last night about the results, which was the day of the procedure.  I  told them that I suspected that he had either restenosis of the LAD stent or  another de novo lesion in the LAD distribution.  The  patient was set up for  an office visit this morning at 9 o'clock.  He reports to me very typical  angina now on minimal exertion.  The episodes are brief and lasting only 2-3  minutes.  He did not try to take any nitroglycerin.  He did not have any  resting pain, however.  We reviewed in the office today his stress study and  we have made arrangements for catheterization tomorrow with Dr. Riley Kill.   MEDICATIONS:  1. Aspirin 81 mg a day, which was recently decreased by Gene Serpe, PA-C.  2. Lopressor 12.5 mg one tablet p.o. b.i.d.  3. Vytorin 10/20 daily.  4. Nexium 40 mg a day.  5. Fish oil 1000 mg p.o. daily.  6. Multivitamin.  7. Plavix 75 mg a day.  8. TriCor 145 mg p.o. daily.   PHYSICAL EXAMINATION:  VITAL SIGNS:  Blood pressure is 116/68.  Heart rate  is 59 beats per minute.  Weight is 180 pounds.  GENERAL:  A well-nourished  white male in no apparent distress.  HEENT:  Pupils isocoric.  Conjunctivae clear.  NECK:  Supple.  Normal carotid upstroke.  No carotid bruits.  LUNGS:  Clear breath sounds bilaterally.  HEART:  Regular rate and rhythm with normal S1 and S2.  No murmurs, rubs, or  gallops.  ABDOMEN:  Soft and nontender with no rebound or guarding and good bowel  sounds.  EXTREMITIES:  No cyanosis, clubbing or edema.  Peripheral pulses are intact  bilaterally.   TWELVE-LEAD ELECTROCARDIOGRAM:  Demonstrates normal sinus rhythm with no  acute ischemic changes.   PROBLEM LIST:  1. Coronary artery disease.      a.     Recurrent substernal chest pressure.      b.     Markedly positive exercise Cardiolite study with a moderate to       large anterior defect (reversible).      c.     Preserved left ventricular function.      d.     Status post Cypher stent for subtotal occlusion of the left       anterior descending in June 2007 by Dr. Riley Kill.  2. Fatigue, likely secondary to #1.  3. Hypotension, resolved.  4. Severe hypertriglyceridemia.      a.     On fish oil,  TriCor and Vytorin combination.  5. History of tobacco use.  6. History of impotence.   PLAN:  1. I reviewed extensively with the patient and his wife the results of his      stress test.  He clearly has a large anterior reversible defect      suggestive of LAD stenosis.  This defect is fully reversible.  2. I have given the patient a prescription for Imdur in the office today      and I asked him to increase his aspirin to 325 mg daily.  He will      continue on Plavix.  The patient has been instructed that if he has      chest pain at rest, that he will present himself immediately to the      emergency room.  3. Arrangements were made for the patient to be done in the inpatient lab      by Dr. Riley Kill tomorrow at [facility].  4. All the questions were answered and clear instructions were given for      the patient regarding his upcoming catheterization.  I have also told      him to take p.r.n. nitroglycerin if needed.       Learta Codding, MD,FACC     GED/MedQ  DD:  06/20/2006  DT:  06/21/2006  Job #:  [id number]   cc:   Luis Abed, MD, [facility]  Dhruv Ronnell Freshwater D. Riley Kill, MD, [facility]

## 2011-01-21 NOTE — Assessment & Plan Note (Signed)
[facility]                          [facility] OFFICE NOTE   NAME:[name], [name]                     MRN:          [id number]  DATE:09/28/2006                            DOB:          09/12/1950    REFERRING PHYSICIAN:  Dhruv Vyas   HISTORY OF PRESENT ILLNESS:  The patient is a 60 year old male with  single vessel coronary artery disease status post CYPHER stenting to a  subtotal occlusion of the LAD in June of 2007.  The patient had a repeat  catheterization done and was found to have a patent stent but with 2  diagonal branches with moderate to severe disease, particularly in the  second larger diagonal branch.  The patient was placed, after his  catheterization, on amlodipine with good angina control.  As a matter of  fact, the patient is doing quite well now and is able to walk and exert  himself without any substernal chest pain.  He does complain still of  impotence and he is concerned about this.  He is also concerned about  his recent lab work that was ordered, including lipid panel with  triglycerides of 461 and cholesterol of 257, HDL of 33.  We did stop, on  a prior visit, Vytorin, given the patient's complaints of weakness and  muscle pains.  The later symptoms actually have resolved.  The patient  is now maintained on fish oil and TriCor.  He does admit to being  noncompliant with his dietary regimen, particularly around the holidays,  and also does eat a fair bit of carbohydrates, likely responsible for  his elevated triglycerides.   MEDICATIONS:  1. Multivitamin.  2. Fish oil 1000 mg a day.  3. TriCor 145 mg a day.  4. Nexium 40 mg a day.  5. Aspirin 81 mg a day.  6. Amlodipine 5 mg p.o. daily.   PHYSICAL EXAMINATION:  VITAL SIGNS:  Blood pressure 132/88, heart rate  76 beats per minute, weight 188 pounds.  NECK:  Normal carotid upstroke and no carotid bruits.  LUNGS:  Clear breath sounds bilaterally.  HEART:  Regular  rate and rhythm.  Normal S1, S2.  No murmurs, rubs, or  gallops.  ABDOMEN:  Soft and nontender with no rebound or guarding.  Good bowel  sounds.  EXTREMITIES:  No cyanosis, clubbing, or edema.   PROBLEM LIST:  1. Coronary artery disease.      a.     No recurrence of substernal chest pain.      b.     Abnormal Cardiolite stress study with a modest to large       anterior defect.      c.     Repeat cardiac catheterization with high-grade lesion in       diagonal branch with a patent stent to the left anterior       descending.      d.     Status post CYPHER stent for subtotal occlusion of the left       anterior descending in June of 2007.  2. Weakness secondary to Statin drug therapy.  3. Hypotension, resolved.  4. Severe hypertriglyceridemia.      a.     On fish oil and TriCor.      b.     Increase carbohydrate intake.  5. History of tobacco use.  6. History of impotence.   PLAN:  1. I have reviewed the patient's laboratory work and he continues to      have high triglycerides and elevated cholesterol.  He does admit to      being poorly compliant with his decreased carbohydrate intake and      he will try to change his dietary regimen.  2. I also told the patient that he can start taking red yeast rice.      He does not want to take Vytorin at this point in time.  It does      seem to have improved his symptoms of muscle weakness.  3. The patient will follow up with [place] in 8 weeks for repeat lipid      panel.  4. Doppler studies also were done in the meanwhile and showed no      significant peripheral vascular disease.  5. Amlodipine can be continued, and seems to be associated with good      angina control.  6. I have given the patient a prescription of Viagra and carefully      explained to him not to take nitrates in conjunction with this      prescription.     Learta Codding, MD,FACC  Electronically Signed    GED/MedQ  DD: 09/28/2006  DT: 09/28/2006  Job #:  [id number]   cc:   Doreen Beam

## 2012-02-10 ENCOUNTER — Encounter: Payer: Self-pay | Admitting: Cardiology

## 2012-02-10 ENCOUNTER — Ambulatory Visit (INDEPENDENT_AMBULATORY_CARE_PROVIDER_SITE_OTHER): Payer: Managed Care, Other (non HMO) | Admitting: Cardiology

## 2012-02-10 VITALS — BP 144/80 | HR 71 | Ht 70.0 in | Wt 190.0 lb

## 2012-02-10 DIAGNOSIS — E785 Hyperlipidemia, unspecified: Secondary | ICD-10-CM | POA: Insufficient documentation

## 2012-02-10 DIAGNOSIS — E782 Mixed hyperlipidemia: Secondary | ICD-10-CM

## 2012-02-10 DIAGNOSIS — I209 Angina pectoris, unspecified: Secondary | ICD-10-CM

## 2012-02-10 DIAGNOSIS — I10 Essential (primary) hypertension: Secondary | ICD-10-CM | POA: Insufficient documentation

## 2012-02-10 MED ORDER — CHOLINE FENOFIBRATE 135 MG PO CPDR: 135.0000 mg | DELAYED_RELEASE_CAPSULE | Freq: Every day | ORAL | Status: DC

## 2012-02-10 MED ORDER — ROSUVASTATIN CALCIUM 10 MG PO TABS: 10.0000 mg | ORAL_TABLET | Freq: Every day | ORAL | Status: DC

## 2012-02-10 MED ORDER — LISINOPRIL 10 MG PO TABS: 10.0000 mg | ORAL_TABLET | Freq: Every day | ORAL | Status: DC

## 2012-02-10 MED ORDER — LISINOPRIL-HYDROCHLOROTHIAZIDE 10-12.5 MG PO TABS: 1.0000 | ORAL_TABLET | Freq: Every day | ORAL | Status: DC

## 2012-02-10 MED ORDER — AMLODIPINE BESYLATE 10 MG PO TABS: 10.0000 mg | ORAL_TABLET | Freq: Every day | ORAL | Status: DC

## 2012-02-10 MED ORDER — ESOMEPRAZOLE MAGNESIUM 40 MG PO CPDR: 40.0000 mg | DELAYED_RELEASE_CAPSULE | Freq: Every day | ORAL | Status: DC

## 2012-02-10 NOTE — Assessment & Plan Note (Addendum)
Dyazide was added to his medical regime when he developed mild pedal edema, likely exacerbated by high-dose amlodipine.  He is concerned about the combination diuretic, especially because he has a history of borderline hyperkalemia.  Lisinopril HCT will be substituted.  Total visit of 45 minutes, the majority of which was spent discussing medication, testing and therapeutic options.

## 2012-02-10 NOTE — Patient Instructions (Signed)
**Note De-Identified [name] Obfuscation** Your physician has recommended you make the following change in your medication: stop taking Triamterene/HCTS and start taking Lisinopril/HCTZ 10/12.5 mg daily  Your physician has requested that you have an exercise tolerance test. For further information please visit [email or web address] Please also follow instruction sheet, as given.  Your physician recommends that you return for lab work in: by June 14  Your physician recommends that you schedule a follow-up appointment in: 1 month

## 2012-02-10 NOTE — Progress Notes (Signed)
Patient ID: [name], male   DOB: 08-03-1951, 61 y.o.   MRN: [id number]  HPI: Patient is seen at his request approximately 1.5 years since his most recent evaluation by Dr. Andee Hobbs.  He has a history of coronary artery disease and multiple cardiovascular risk factors that have been well managed medically.  He was perfectly happy with Dr. Margarita Hobbs care, but prefers to change to a different [place] office in hopes that we offer better client service.  He reports exertional chest discomfort associated with dyspnea that is promptly relieved with rest or sublingual nitroglycerin.  He has had such symptoms in the past, but episodes are more frequent at present and occur at lower levels of activity.  Nonetheless, he sometimes has quite good exercise tolerance with no cardiopulmonary symptoms.  He occasionally experiences chest discomfort at rest.  When Amlodipine was started for similar symptoms, there was substantial improvement.  Current Outpatient Prescriptions on File Prior to Visit  Medication Sig Dispense Refill  . Choline Fenofibrate (TRILIPIX) 135 MG capsule Take 1 capsule (135 mg total) by mouth daily.  90 capsule  3  . esomeprazole (NEXIUM) 40 MG capsule Take 1 capsule (40 mg total) by mouth daily before breakfast.  90 capsule  3  . rosuvastatin (CRESTOR) 10 MG tablet Take 1 tablet (10 mg total) by mouth daily.  90 tablet  3  . amLODipine (NORVASC) 10 MG tablet Take 1 tablet (10 mg total) by mouth daily.  90 tablet  0  . lisinopril (PRINIVIL,ZESTRIL) 10 MG tablet Take 1 tablet (10 mg total) by mouth daily.  30 tablet  0  . lisinopril-hydrochlorothiazide (PRINZIDE,ZESTORETIC) 10-12.5 MG per tablet Take 1 tablet by mouth daily.  90 tablet  1  . metoprolol (TOPROL XL) 50 MG 24 hr tablet Take 1 tablet (50 mg total) by mouth daily.  90 tablet  0   Allergies  Allergen Reactions  . Crestor (Rosuvastatin) Other (See Comments)    Myalgias; also Vytorin--  PT CAN SOMEWHAT TOLERATE CRESTOR 10 mg  .  Vytorin (Ezetimibe-Simvastatin) Other (See Comments)    Myalgias; also rosuvastatin     Past Medical History  Diagnosis Date  . 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 Tobacco abuse     remote; also remote moderate alcohol consumption  . Hyperlipidemia     triglycerides greater than 1000    History reviewed. No pertinent past surgical history.   No family history on file.   History   Social History  . Marital Status: Married    Spouse Name: N/A    Number of Children: N/A  . Years of Education: N/A   Occupational History  . Not on file.   Social History Main Topics  . Smoking status: Former Smoker    Quit date: 09/05/1998  . Smokeless tobacco: Not on file  . Alcohol Use: Not on file  . Drug Use: Not on file  . Sexually Active: Not on file   Other Topics Concern  . Not on file   Social History Narrative  . No narrative on file    ROS:  Denies orthopnea, PND, palpitations, lightheadedness or syncope.  No peripheral edema.No history of cough or renal dysfunction related to drug therapy.   All other systems reviewed and are negative.  PHYSICAL EXAM: BP 144/80  Pulse 71  Ht 5\' 10"  (1.778 m)  Wt 86.183 kg (190 lb)  BMI 27.26 kg/m2  General-Well-developed; no acute distress Body Habitus-proportionate weight and height HEENT-[place]/AT; PERRL; EOM intact; conjunctiva and lids nl Neck-No JVD; no carotid bruits Endocrine-No thyromegaly Lungs-Clear lung fields; resonant percussion; normal I-to-E ratio Cardiovascular- normal PMI; normal S1 and S2; prominent S4; early systolic ejection murmur Abdomen-BS normal; soft and non-tender without masses or organomegaly Musculoskeletal-No deformities, cyanosis or clubbing Neurologic-Nl cranial nerves; symmetric strength and tone Skin- Warm, no significant  lesions Extremities-Nl distal pulses; no edema  EKG:  Normal sinus rhythm; right ventricular conduction delay; otherwise normal.   Graded Exercise Test-Interpretation      Treadmill stress test performed 02/14/2012      Graded exercise to a work load of 6.7 METs and a heart rate of 122, 75% of age-predicted maximum.  Exercise discontinued due to dyspnea and fatigue; no chest discomfort reported.  Blood pressure increased from a resting value of 145/70 to 170/80 at peak exercise.  No arrhythmias noted. Stress EKG:  Starting early in stage II of the Bruce protocol, 1 mm of flat and upsloping ST segment depression was noted.  Chest discomfort developed within one minute of that observation prompting the study to be discontinued.  Chest tightness and dyspnea resolved rapidly following administration of sublingual nitroglycerin.  ST Segment abnormalities resolved within the first minute of recovery.  No arrhythmias noted Impression:  Abnormal graded exercise test revealing impaired exercise capacity, development of symptoms highly suggestive of exertional angina and significant ST segment depression at a low work load.  Other findings as noted.  ASSESSMENT AND PLAN:  Juan Bing, MD 02/10/2012 9:04 PM

## 2012-02-10 NOTE — Progress Notes (Deleted)
**Note De-Identified [name] Obfuscation** Name: [name]    DOB: 05-24-1951  Age: 61 y.o.  MR#: [id number]       PCP:  Ignatius Specking., MD, MD      Insurance: @PAYORNAME @   CC:    Chief Complaint  Patient presents with  . Appointment     pt complains of more angina     VS BP 144/80  Pulse 71  Ht 5\' 10"  (1.778 m)  Wt 190 lb (86.183 kg)  BMI 27.26 kg/m2  Weights Current Weight  02/10/12 190 lb (86.183 kg)  07/14/10 192 lb (87.091 kg)  06/08/09 189 lb (85.73 kg)    Blood Pressure  BP Readings from Last 3 Encounters:  02/10/12 144/80  07/14/10 127/77  06/08/09 129/81     Admit date:  (Not on file) Last encounter with RMR:  Visit date not found   Allergy Allergies  Allergen Reactions  . Crestor (Rosuvastatin) Other (See Comments)    Myalgias; also Vytorin--  PT CAN SOMEWHAT TOLERATE CRESTOR 10 mg  . Vytorin (Ezetimibe-Simvastatin) Other (See Comments)    Myalgias; also rosuvastatin     Current Outpatient Prescriptions  Medication Sig Dispense Refill  . amLODipine (NORVASC) 10 MG tablet Take 10 mg by mouth daily.      Marland Kitchen aspirin 81 MG EC tablet Take 81 mg by mouth daily. Swallow whole.      . Choline Fenofibrate (TRILIPIX) 135 MG capsule Take 135 mg by mouth daily.      Marland Kitchen esomeprazole (NEXIUM) 40 MG capsule Take 40 mg by mouth daily before breakfast.      . metoprolol succinate (TOPROL-XL) 50 MG 24 hr tablet Take 50 mg by mouth daily. Take with or immediately following a meal.      . Omega-3 Fatty Acids (FISH OIL) 1000 MG CAPS Take by mouth. TAKE THREE TABS DAILY      . rosuvastatin (CRESTOR) 10 MG tablet Take 10 mg by mouth daily.      Marland Kitchen triamterene-hydrochlorothiazide (MAXZIDE-25) 37.5-25 MG per tablet Take 1 tablet by mouth daily.      Marland Kitchen amLODipine (NORVASC) 10 MG tablet Take 1 tablet (10 mg total) by mouth daily.  90 tablet  0  . metoprolol (TOPROL XL) 50 MG 24 hr tablet Take 1 tablet (50 mg total) by mouth daily.  90 tablet  0    Discontinued Meds:   There are no discontinued  medications.  Patient Active Problem List  Diagnoses  . Arteriosclerotic cardiovascular disease (ASCVD)  . Hyperlipidemia    LABS No results found for any previous visit.   Results for this Opt Visit:    No results found for this or any previous visit.  EKG Orders placed in visit on 07/14/10  . CONVERTED CEMR EKG     Prior Assessment and Plan Problem List as of 02/10/2012          Cardiology Problems   Arteriosclerotic cardiovascular disease (ASCVD)   Hyperlipidemia       Imaging: No results found.   FRS Calculation: Score not calculated. Missing: Total Cholesterol, HDL

## 2012-02-10 NOTE — Assessment & Plan Note (Signed)
Symptoms are concerning for exertional angina; however, evaluation of similar symptoms in the past did not yield unequivocal evidence for myocardial ischemia or progression of coronary disease.  Since the patient has a normal EKG and believes that he will exercise adequately on a treadmill, we will proceed with a standard graded exercise test.  Metoprolol will be held on the day of the test, but ultimately, I anticipate an increase in dosage.  Amlodipine dosage is maximized.  He has developed intractable headaches with isosorbide mononitrate in the past.  Ranolazine will be a consideration if symptoms persist.  He is not inclined to undergo repeat cardiac catheterization.

## 2012-02-10 NOTE — Assessment & Plan Note (Signed)
Most recent lipid profile available to me from 6 months ago was suboptimal with moderate elevation of triglycerides, elevated total cholesterol and minimally elevated LDL.  A profile will be repeated and medical therapy adjusted.

## 2012-02-13 ENCOUNTER — Telehealth: Payer: Self-pay | Admitting: Cardiology

## 2012-02-13 NOTE — Telephone Encounter (Signed)
[facility] CALLING WITH QUESTIONS ABOUT TWO DIFFERENT RX'S THAT WERE CALLED IN FOR LISINOPRIL.

## 2012-02-13 NOTE — Telephone Encounter (Signed)
Pharmacy is [facility]ico[facility] of [place] calling for clarification.  Patient is to be taking lisinopril/HCT 10/12.5, according to OVN and triamterene/HCT is to be stopped.  Clarification made to pharmacist.

## 2012-02-14 ENCOUNTER — Ambulatory Visit (INDEPENDENT_AMBULATORY_CARE_PROVIDER_SITE_OTHER): Payer: Managed Care, Other (non HMO)

## 2012-02-14 ENCOUNTER — Telehealth: Payer: Self-pay | Admitting: Cardiology

## 2012-02-14 ENCOUNTER — Encounter: Payer: Self-pay | Admitting: *Deleted

## 2012-02-14 ENCOUNTER — Other Ambulatory Visit: Payer: Self-pay | Admitting: *Deleted

## 2012-02-14 ENCOUNTER — Ambulatory Visit (HOSPITAL_COMMUNITY)
Admission: RE | Admit: 2012-02-14 | Discharge: 2012-02-14 | Disposition: A | Discharge status: Home / Self Care | Payer: Managed Care, Other (non HMO) | Source: Ambulatory Visit | Attending: Cardiology | Admitting: Cardiology

## 2012-02-14 DIAGNOSIS — I2510 Atherosclerotic heart disease of native coronary artery without angina pectoris: Secondary | ICD-10-CM

## 2012-02-14 DIAGNOSIS — I7090 Unspecified atherosclerosis: Secondary | ICD-10-CM

## 2012-02-14 DIAGNOSIS — I209 Angina pectoris, unspecified: Secondary | ICD-10-CM

## 2012-02-14 DIAGNOSIS — Z01810 Encounter for preprocedural cardiovascular examination: Secondary | ICD-10-CM

## 2012-02-14 DIAGNOSIS — Z01818 Encounter for other preprocedural examination: Secondary | ICD-10-CM | POA: Insufficient documentation

## 2012-02-14 DIAGNOSIS — R079 Chest pain, unspecified: Secondary | ICD-10-CM

## 2012-02-14 LAB — CBC
Hemoglobin: 14.7 g/dL (ref 13.0–17.0)
MCH: 31.5 pg (ref 26.0–34.0)
MCV: 91.4 fL (ref 78.0–100.0)
Platelets: 141 10*3/uL — ABNORMAL LOW (ref 150–400)
RBC: 4.66 MIL/uL (ref 4.22–5.81)
WBC: 4.4 10*3/uL (ref 4.0–10.5)

## 2012-02-14 LAB — PROTIME-INR
INR: 0.91 (ref <1.50)
Prothrombin Time: 12.6 seconds (ref 11.6–15.2)

## 2012-02-14 LAB — BASIC METABOLIC PANEL
BUN: 24 mg/dL — ABNORMAL HIGH (ref 6–23)
Chloride: 100 mEq/L (ref 96–112)
Glucose, Bld: 90 mg/dL (ref 70–99)
Potassium: 4.3 mEq/L (ref 3.5–5.3)
Sodium: 137 mEq/L (ref 135–145)

## 2012-02-14 MED ORDER — LISINOPRIL-HYDROCHLOROTHIAZIDE 10-12.5 MG PO TABS: 1.0000 | ORAL_TABLET | Freq: Every day | ORAL | Status: DC

## 2012-02-14 NOTE — Telephone Encounter (Signed)
Patient's wife has questions regarding Lisinopril/HCTZ.  States that it was supposed to got to [facility] [facility] and it went to [facility].  Could you please send this to [facility].  / tg

## 2012-02-14 NOTE — Progress Notes (Signed)
Stress Lab Nurses Notes - [name]  [name] 02/14/2012 Reason for doing test: CAD and Chest Pain Type of test: Regular GTX Nurse performing test: Parke Poisson, RN Nuclear Medicine Tech: Not Applicable Echo Tech: Not Applicable MD performing test: R. Rothbart Family MD: Vyas Test explained and consent signed: yes IV started: No IV started Symptoms: chest discomfort started @ pain #3 Treatment/Intervention: NTG 0.4SL x 1 @ 11:47 pain # 9 Reason test stopped: chest pain After recovery IV was: NA Patient to return to Nuc. Med at : NA Patient discharged: Home Patient's Condition upon discharge was: stable Comments: During test peak BP 172/78 & HR 122.  Recovery BP 150/78 & HR 88.  Symptoms resolved in recovery.  Pain relieved # 0. Discharge Instruction given for catherazation Tawni Millers

## 2012-02-14 NOTE — H&P (Signed)
Patient ID: [name], male DOB: 06-Dec-1950, 61 y.o. MRN: [id number]  HPI: Patient is seen at his request approximately 1.5 years since his most recent evaluation by Dr. Andee Lineman. He has history of coronary artery disease and multiple cardiovascular risk factors that have been well managed medically. He was perfectly happy with Dr. Margarita Mail care, but prefers to change to different [place] office in hopes that we offer better client service. He reports exertional chest discomfort associated with dyspnea it is probably relieved with rest or sublingual nitroglycerin. He has had such symptoms in the past, but episodes are more frequent at present and occur at lower levels of activity. None the less, he sometimes has quite good exercise tolerance with no cardiopulmonary symptoms. He occasionally experiences chest discomfort at rest. When Amlodipine was started for similar symptoms, there was substantial improvement.  Current Outpatient Prescriptions on File Prior to Visit   Medication  Sig  Dispense  Refill   .  Choline Fenofibrate (TRILIPIX) 135 MG capsule  Take 1 capsule (135 mg total) by mouth daily.  90 capsule  3   .  esomeprazole (NEXIUM) 40 MG capsule  Take 1 capsule (40 mg total) by mouth daily before breakfast.  90 capsule  3   .  rosuvastatin (CRESTOR) 10 MG tablet  Take 1 tablet (10 mg total) by mouth daily.  90 tablet  3   .  amLODipine (NORVASC) 10 MG tablet  Take 1 tablet (10 mg total) by mouth daily.  90 tablet  0   .  lisinopril (PRINIVIL,ZESTRIL) 10 MG tablet  Take 1 tablet (10 mg total) by mouth daily.  30 tablet  0   .  lisinopril-hydrochlorothiazide (PRINZIDE,ZESTORETIC) 10-12.5 MG per tablet  Take 1 tablet by mouth daily.  90 tablet  1   .  metoprolol (TOPROL XL) 50 MG 24 hr tablet  Take 1 tablet (50 mg total) by mouth daily.  90 tablet  0    Allergies   Allergen  Reactions   .  Crestor (Rosuvastatin)  Other (See Comments)     Myalgias; also Vytorin-- PT CAN SOMEWHAT TOLERATE  CRESTOR 10 mg   .  Vytorin (Ezetimibe-Simvastatin)  Other (See Comments)     Myalgias; also rosuvastatin    Past Medical History   Diagnosis  Date   .  Arteriosclerotic cardiovascular disease (ASCVD)  02/2006     ACS in 02/2006: DES to subtotal LAD; 50% D1; 30% CX; normal RCA; normal EF;repeat cath in 06/2006: Residual 80% small D1 and 70% D2-medical therapy advised   .  Tobacco abuse      remote; also remote moderate alcohol consumption   .  Hyperlipidemia      triglycerides greater than 1000    History reviewed. No pertinent past surgical history.  No family history on file.  History    Social History   .  Marital Status:  Married     Spouse Name:  N/A     Number of Children:  N/A   .  Years of Education:  N/A    Occupational History   .  Not on file.    Social History Main Topics   .  Smoking status:  Former Smoker     Quit date:  09/05/1998   .  Smokeless tobacco:  Not on file   .  Alcohol Use:  Not on file   .  Drug Use:  Not on file   .  Sexually Active:  Not on  file    Other Topics  Concern   .  Not on file    Social History Narrative   .  No narrative on file    ROS: Denies orthopnea, PND, palpitations, lightheadedness or syncope. No peripheral edema.No history of cough or renal dysfunction related to drug therapy. All other systems reviewed and are negative.  PHYSICAL EXAM:  BP 144/80  Pulse 71  Ht 5\' 10"  (1.778 m)  Wt 86.183 kg (190 lb)  BMI 27.26 kg/m2  General-Well-developed; no acute distress  Body Habitus-proportionate weight and height  HEENT-[place]/AT; PERRL; EOM intact; conjunctiva and lids nl  Neck-No JVD; no carotid bruits  Endocrine-No thyromegaly  Lungs-Clear lung fields; resonant percussion; normal I-to-E ratio  Cardiovascular- normal PMI; normal S1 and S2; prominent S4; early systolic ejection murmur  Abdomen-BS normal; soft and non-tender without masses or organomegaly  Musculoskeletal-No deformities, cyanosis or clubbing  Neurologic-Nl  cranial nerves; symmetric strength and tone  Skin- Warm, no significant lesions  Extremities-Nl distal pulses; no edema  EKG: Normal sinus rhythm; right ventricular conduction delay; otherwise normal.  ASSESSMENT AND PLAN:  [name], MD  02/10/2012   02/14/12:  Patient returns for graded exercise test.  He achieved a workload of only 7 Mets, developing severe angina and dyspnea resulting in termination of the test.  This was associated with substantial ST segment depression on the EKG.  Sublingual nitroglycerin promptly reversed all symptoms.  Due to severe symptoms developing at a low work load, I doubt that medical therapy will prove adequate to manage this problem.  Cardiac catheterization has been advised, and patient reluctantly accepts this recommendation.  Although he may well require intervention, an outpatient study has been arranged.  If PCI is necessary, he can be transferred to the hospital laboratory or readmitted on a different day.

## 2012-02-17 ENCOUNTER — Inpatient Hospital Stay (HOSPITAL_BASED_OUTPATIENT_CLINIC_OR_DEPARTMENT_OTHER)
Admission: RE | Admit: 2012-02-17 | Discharge: 2012-02-17 | Disposition: A | Discharge status: Hospice | Payer: Managed Care, Other (non HMO) | Source: Ambulatory Visit | Attending: Cardiovascular Disease | Admitting: Cardiovascular Disease

## 2012-02-17 ENCOUNTER — Encounter: Payer: Self-pay | Admitting: *Deleted

## 2012-02-17 ENCOUNTER — Encounter (HOSPITAL_COMMUNITY): Payer: Self-pay | Admitting: General Practice

## 2012-02-17 ENCOUNTER — Inpatient Hospital Stay (HOSPITAL_COMMUNITY)
Admission: AD | Admit: 2012-02-17 | Discharge: 2012-02-18 | DRG: 247 | Disposition: A | Discharge status: Home / Self Care | Payer: Managed Care, Other (non HMO) | Source: Ambulatory Visit | Attending: Cardiovascular Disease | Admitting: Cardiovascular Disease

## 2012-02-17 ENCOUNTER — Encounter (HOSPITAL_COMMUNITY)
Admission: AD | Disposition: A | Discharge status: Home / Self Care | Payer: Self-pay | Source: Ambulatory Visit | Attending: Cardiovascular Disease

## 2012-02-17 ENCOUNTER — Encounter (HOSPITAL_BASED_OUTPATIENT_CLINIC_OR_DEPARTMENT_OTHER): Payer: Self-pay | Admitting: *Deleted

## 2012-02-17 ENCOUNTER — Telehealth: Payer: Self-pay | Admitting: Cardiovascular Disease

## 2012-02-17 ENCOUNTER — Encounter (HOSPITAL_BASED_OUTPATIENT_CLINIC_OR_DEPARTMENT_OTHER)
Admission: RE | Disposition: A | Discharge status: Hospice | Payer: Self-pay | Source: Ambulatory Visit | Attending: Cardiovascular Disease

## 2012-02-17 DIAGNOSIS — R9439 Abnormal result of other cardiovascular function study: Secondary | ICD-10-CM | POA: Diagnosis present

## 2012-02-17 DIAGNOSIS — Z9861 Coronary angioplasty status: Secondary | ICD-10-CM

## 2012-02-17 DIAGNOSIS — I209 Angina pectoris, unspecified: Secondary | ICD-10-CM

## 2012-02-17 DIAGNOSIS — I2510 Atherosclerotic heart disease of native coronary artery without angina pectoris: Principal | ICD-10-CM | POA: Diagnosis present

## 2012-02-17 DIAGNOSIS — K219 Gastro-esophageal reflux disease without esophagitis: Secondary | ICD-10-CM | POA: Diagnosis present

## 2012-02-17 DIAGNOSIS — E785 Hyperlipidemia, unspecified: Secondary | ICD-10-CM | POA: Diagnosis present

## 2012-02-17 DIAGNOSIS — F17200 Nicotine dependence, unspecified, uncomplicated: Secondary | ICD-10-CM | POA: Diagnosis present

## 2012-02-17 DIAGNOSIS — Z79899 Other long term (current) drug therapy: Secondary | ICD-10-CM

## 2012-02-17 DIAGNOSIS — I200 Unstable angina: Secondary | ICD-10-CM | POA: Diagnosis present

## 2012-02-17 DIAGNOSIS — Y840 Cardiac catheterization as the cause of abnormal reaction of the patient, or of later complication, without mention of misadventure at the time of the procedure: Secondary | ICD-10-CM | POA: Diagnosis present

## 2012-02-17 HISTORY — DX: Pneumonia, unspecified organism: J18.9

## 2012-02-17 HISTORY — PX: CORONARY ANGIOPLASTY WITH STENT PLACEMENT: SHX49

## 2012-02-17 HISTORY — DX: Atherosclerotic heart disease of native coronary artery without angina pectoris: I25.10

## 2012-02-17 HISTORY — PX: PERCUTANEOUS CORONARY STENT INTERVENTION (PCI-S): SHX5485

## 2012-02-17 HISTORY — DX: Angina pectoris, unspecified: I20.9

## 2012-02-17 LAB — POCT ACTIVATED CLOTTING TIME: Activated Clotting Time: 574 seconds

## 2012-02-17 SURGERY — PERCUTANEOUS CORONARY STENT INTERVENTION (PCI-S)
Anesthesia: LOCAL

## 2012-02-17 SURGERY — JV LEFT HEART CATHETERIZATION WITH CORONARY ANGIOGRAM
Anesthesia: Moderate Sedation

## 2012-02-17 MED ORDER — ASPIRIN 81 MG PO TBEC: 81.0000 mg | DELAYED_RELEASE_TABLET | Freq: Every day | ORAL | Status: DC

## 2012-02-17 MED ORDER — BIVALIRUDIN 250 MG IV SOLR
INTRAVENOUS | Status: AC
  Filled 2012-02-17: qty 250

## 2012-02-17 MED ORDER — LISINOPRIL-HYDROCHLOROTHIAZIDE 10-12.5 MG PO TABS: 1.0000 | ORAL_TABLET | Freq: Every day | ORAL | Status: DC

## 2012-02-17 MED ORDER — ACETAMINOPHEN 325 MG PO TABS: 650.0000 mg | ORAL_TABLET | ORAL | Status: DC | PRN

## 2012-02-17 MED ORDER — ASPIRIN EC 81 MG PO TBEC
81.0000 mg | DELAYED_RELEASE_TABLET | Freq: Every day | ORAL | Status: DC
  Filled 2012-02-17 (×2): qty 1

## 2012-02-17 MED ORDER — SODIUM CHLORIDE 0.9 % IJ SOLN: 3.0000 mL | INTRAMUSCULAR | Status: DC | PRN

## 2012-02-17 MED ORDER — ASPIRIN 81 MG PO CHEW
324.0000 mg | CHEWABLE_TABLET | ORAL | Status: AC
  Administered 2012-02-17: 243 mg via ORAL

## 2012-02-17 MED ORDER — LISINOPRIL 10 MG PO TABS
10.0000 mg | ORAL_TABLET | Freq: Every day | ORAL | Status: DC
  Administered 2012-02-17: 10 mg via ORAL
  Filled 2012-02-17 (×2): qty 1

## 2012-02-17 MED ORDER — FENTANYL CITRATE 0.05 MG/ML IJ SOLN
INTRAMUSCULAR | Status: AC
  Filled 2012-02-17: qty 2

## 2012-02-17 MED ORDER — HYDROCHLOROTHIAZIDE 12.5 MG PO CAPS
12.5000 mg | ORAL_CAPSULE | Freq: Every day | ORAL | Status: DC
  Administered 2012-02-17: 12.5 mg via ORAL
  Filled 2012-02-17 (×2): qty 1

## 2012-02-17 MED ORDER — ROSUVASTATIN CALCIUM 10 MG PO TABS
10.0000 mg | ORAL_TABLET | Freq: Every day | ORAL | Status: DC
  Administered 2012-02-17: 10 mg via ORAL
  Filled 2012-02-17 (×2): qty 1

## 2012-02-17 MED ORDER — AMLODIPINE BESYLATE 10 MG PO TABS
10.0000 mg | ORAL_TABLET | Freq: Every day | ORAL | Status: DC
  Administered 2012-02-17: 10 mg via ORAL
  Filled 2012-02-17 (×2): qty 1

## 2012-02-17 MED ORDER — SODIUM CHLORIDE 0.9 % IJ SOLN: 3.0000 mL | Freq: Two times a day (BID) | INTRAMUSCULAR | Status: DC

## 2012-02-17 MED ORDER — SODIUM CHLORIDE 0.9 % IV SOLN: INTRAVENOUS | Status: AC

## 2012-02-17 MED ORDER — MIDAZOLAM HCL 2 MG/2ML IJ SOLN
INTRAMUSCULAR | Status: AC
  Filled 2012-02-17: qty 2

## 2012-02-17 MED ORDER — SODIUM CHLORIDE 0.9 % IV SOLN: 250.0000 mL | INTRAVENOUS | Status: DC | PRN

## 2012-02-17 MED ORDER — METOPROLOL SUCCINATE ER 50 MG PO TB24
50.0000 mg | ORAL_TABLET | Freq: Every day | ORAL | Status: DC
  Administered 2012-02-17: 50 mg via ORAL
  Filled 2012-02-17 (×2): qty 1

## 2012-02-17 MED ORDER — SODIUM CHLORIDE 0.9 % IV SOLN
INTRAVENOUS | Status: DC
  Administered 2012-02-17: 07:00:00 via INTRAVENOUS

## 2012-02-17 MED ORDER — HYDROCHLOROTHIAZIDE 25 MG PO TABS
12.5000 mg | ORAL_TABLET | Freq: Every day | ORAL | Status: DC
  Filled 2012-02-17: qty 0.5

## 2012-02-17 MED ORDER — ATORVASTATIN CALCIUM 40 MG PO TABS: 40.0000 mg | ORAL_TABLET | Freq: Every day | ORAL | Status: DC

## 2012-02-17 MED ORDER — CLOPIDOGREL BISULFATE 75 MG PO TABS
75.0000 mg | ORAL_TABLET | Freq: Every day | ORAL | Status: DC
  Administered 2012-02-18: 75 mg via ORAL
  Filled 2012-02-17: qty 1

## 2012-02-17 MED ORDER — ONDANSETRON HCL 4 MG/2ML IJ SOLN: 4.0000 mg | Freq: Four times a day (QID) | INTRAMUSCULAR | Status: DC | PRN

## 2012-02-17 NOTE — Telephone Encounter (Signed)
Pt advised that we do not have plavix samples.

## 2012-02-17 NOTE — Telephone Encounter (Signed)
New msg Pt had cath today Pt's wife wanted some samples of plavix because it will take a week to get from [employer]

## 2012-02-17 NOTE — CV Procedure (Signed)
   Cardiac Catheterization Operative Report  [name] [id number] 6/14/20137:58 AM VYAS,DHRUV B., MD  Procedure Performed:  1. Left Heart Catheterization 2. Selective Coronary Angiography 3. Left ventricular angiogram  Operator: Verne Carrow, MD  Indication: Known CAD, unstable angina, abnormal stress test                                      Procedure Details: The risks, benefits, complications, treatment options, and expected outcomes were discussed with the patient. The patient and/or family concurred with the proposed plan, giving informed consent. The patient was brought to the cath lab after IV hydration was begun and oral premedication was given. The patient was further sedated with Versed and Fentanyl. The right groin was prepped and draped in the usual manner. Using the modified Seldinger access technique, a 4 French sheath was placed in the right femoral artery. Standard diagnostic catheters were used to perform selective coronary angiography. A pigtail catheter was used to perform a left ventricular angiogram.  There were no immediate complications. The patient was taken to the recovery area in stable condition.   Hemodynamic Findings: Central aortic pressure: 126/60 Left ventricular pressure: 126/14/23  Angiographic Findings:  Left main: No obstructive disease.   Left Anterior Descending Artery: Moderate sized vessel that courses to the apex. There is a stent present in the proximal to mid segment. There is a 95% stenosis in the proximal LAD just before the stented segment. There is 99% in stent restenosis throughout the stented segment. The distal LAD is disease free. There are two small caliber diagonal branches that are jailed by the stent and both have 90% ostial stenoses. This is unchanged from the last cath in 2007.   Circumflex Artery: Moderate sized vessel with no obstructive disease noted.   Right Coronary Artery: Moderate sized, dominant vessel  with no obstructive disease noted.   Left Ventricular Angiogram: LVEF 55%.   Impression: 1. Severe single vessel CAD with severe stenosis in the proximal LAD and in the proximal to mid stent.  2. Unstable angina 3. Preserved LV systolic function.   Recommendations: Will plan PCI today with DES to the LAD.       Complications:  None. The patient tolerated the procedure well.

## 2012-02-17 NOTE — Interval H&P Note (Signed)
History and Physical Interval Note:  02/17/2012 7:32 AM  [name]  has presented today for surgery, with the diagnosis of abn stress test  The various methods of treatment have been discussed with the patient and family. After consideration of risks, benefits and other options for treatment, the patient has consented to  Procedure(s) (LRB): JV LEFT HEART CATHETERIZATION WITH CORONARY ANGIOGRAM (N/A) as a surgical intervention .  The patients' history has been reviewed, patient examined, no change in status, stable for surgery.  I have reviewed the patients' chart and labs.  Questions were answered to the patient's satisfaction.     Charmayne Odell

## 2012-02-17 NOTE — CV Procedure (Signed)
   Cardiac Catheterization Operative Report  DERAL SCHELLENBERG [id number] 6/14/20139:04 AM VYAS,DHRUV B., MD  Procedure Performed:  1. PTCA/DES x 1 proximal LAD  Operator: Verne Carrow, MD  Indication:  Unstable angina, diagnostic cath this am with severe proximal LAD stenosis with  Mid segment in-stent restenosis.                                  Procedure Details: The risks, benefits, complications, treatment options, and expected outcomes were discussed with the patient before the diagnostic case. The patient and/or family concurred with the proposed plan, giving informed consent. He was moved upstairs to the inpatient cath lab with a 4 French sheath present in the right femoral artery. The patient was further sedated with Versed and Fentanyl. The right groin was prepped and draped in the usual manner. The sheath was exchanged for a 6 French sheath. He was given a bolus of Angiomax and a drip was started. He had been loaded with Plavix 600 mg po x1 in the outpatient cath lab. A XB LAD 3.5 guide was used to engage the left main. When the ACT was greater than 200, I passed a Cougar IC wire down the LAD. I then used a 2.5 x 20 mm balloon x 2 to pre-dilate the stenosis. I then deployed a 2.5 x 38 mm Promus Element DES in the proximal and mid LAD completely covering the old stent with 5 mm on each edge of the old stent. The stent was post-dilated with a 2.75 x 20 mm [place] balloon x 2. There was an excellent result in the LAD. There are two small caliber diagonal branches that have been jailed by the old stent for the last 6 years. Both of these branches remained open after the new stent placement.   There were no immediate complications. The patient was taken to the recovery area in stable condition.     Hemodynamic Findings: Central aortic pressure: 123/69  Impression: 1. Successful PTCA/DES x 1 proximal and mid LAD  Recommendations: ASA and Plavix for one year.        Complications:   None; patient tolerated the procedure well.

## 2012-02-17 NOTE — Progress Notes (Signed)
Site area: right groin  Site Prior to Removal:  Level 0  Pressure Applied For 20 MINUTES    Minutes Beginning at 1110  Manual:   yes  Patient Status During Pull:  stable  Post Pull Groin Site:  Level 0  Post Pull Instructions Given:  yes  Post Pull Pulses Present:  yes  Dressing Applied:  yes  Comments:    

## 2012-02-18 DIAGNOSIS — I209 Angina pectoris, unspecified: Secondary | ICD-10-CM

## 2012-02-18 LAB — CBC
MCH: 31.7 pg (ref 26.0–34.0)
MCHC: 35.1 g/dL (ref 30.0–36.0)
MCV: 90.5 fL (ref 78.0–100.0)
Platelets: 124 10*3/uL — ABNORMAL LOW (ref 150–400)
RBC: 4.41 MIL/uL (ref 4.22–5.81)
RDW: 12.6 % (ref 11.5–15.5)

## 2012-02-18 LAB — BASIC METABOLIC PANEL
CO2: 28 mEq/L (ref 19–32)
Calcium: 9.6 mg/dL (ref 8.4–10.5)
Creatinine, Ser: 1 mg/dL (ref 0.50–1.35)
GFR calc Af Amer: >90 mL/min (ref >90)
GFR calc non Af Amer: 80 mL/min — ABNORMAL LOW (ref >90)
Sodium: 140 mEq/L (ref 135–145)

## 2012-02-18 MED ORDER — LISINOPRIL-HYDROCHLOROTHIAZIDE 10-12.5 MG PO TABS: 1.0000 | ORAL_TABLET | Freq: Every day | ORAL | Status: DC

## 2012-02-18 MED ORDER — NITROGLYCERIN 0.4 MG SL SUBL: 0.4000 mg | SUBLINGUAL_TABLET | SUBLINGUAL | Status: DC | PRN

## 2012-02-18 MED ORDER — CLOPIDOGREL BISULFATE 75 MG PO TABS: 75.0000 mg | ORAL_TABLET | Freq: Every day | ORAL | Status: DC

## 2012-02-18 MED ORDER — PANTOPRAZOLE SODIUM 40 MG PO TBEC: 40.0000 mg | DELAYED_RELEASE_TABLET | Freq: Every day | ORAL | Status: DC

## 2012-02-18 NOTE — Discharge Summary (Signed)
Discharge Summary   Patient ID: [name] MRN: [id number], DOB/AGE: 10-03-1950 61 y.o.  Primary MD: Ignatius Specking., MD Primary Cardiologist: Dr. Dietrich Pates in [place] Admit date: 02/17/2012 D/C date:     02/18/2012      Primary Discharge Diagnoses:  1. Coronary Artery Disease   - s/p DES to LAD 2007  - Cardiac cath 02/17/12 revealed severe stenosis to LAD with successful PTCA/DES to prox LAD  Secondary Discharge Diagnoses:  1. Hyperlipidemia 2. H/o Tobacco Abuse 3. GERD  Allergies Allergies  Allergen Reactions  . Crestor (Rosuvastatin) Other (See Comments)    Myalgias; also Vytorin--  PT CAN SOMEWHAT TOLERATE CRESTOR 10 mg  . Vytorin (Ezetimibe-Simvastatin) Other (See Comments)    Myalgias; also rosuvastatin     Diagnostic Studies/Procedures:   02/17/12 - Cardiac Cath Hemodynamic Findings:  Central aortic pressure: 126/60  Left ventricular pressure: 126/14/23  Angiographic Findings:  Left main: No obstructive disease.  Left Anterior Descending Artery: Moderate sized vessel that courses to the apex. There is a stent present in the proximal to mid segment. There is a 95% stenosis in the proximal LAD just before the stented segment. There is 99% in stent restenosis throughout the stented segment. The distal LAD is disease free. There are two small caliber diagonal branches that are jailed by the stent and both have 90% ostial stenoses. This is unchanged from the last cath in 2007.  Circumflex Artery: Moderate sized vessel with no obstructive disease noted.  Right Coronary Artery: Moderate sized, dominant vessel with no obstructive disease noted.  Left Ventricular Angiogram: LVEF 55%.  Impression:  1. Severe single vessel CAD with severe stenosis in the proximal LAD and in the proximal to mid stent.  2. Unstable angina  3. Preserved LV systolic function.  Recommendations: Will plan PCI today with DES to the LAD.  02/17/12 - Cardiac Cath  Impression:  1. Successful  PTCA/DES x 1 proximal and mid LAD  Recommendations: ASA and Plavix for one year  History of Present Illness: 61 y.o. male w/ the above medical problems who presented to [facility] on 02/17/12 for diagnostic cath after abnormal stress test.  He was seen in clinic by Dr. Dietrich Pates on 02/14/12 with reports of exertional chest discomfort associated with dyspnea relieved with rest or sublingual nitroglycerin. He has had such symptoms in the past, but episodes are more frequent at present and occur at lower levels of activity. EKG showed NSR with RV conduction delay but otherwise normal. He had an exercise stress test during which he developed severe angina and dyspnea and substantial ST segment depression on the EKG. His symptoms were relieved with NTG and he was schedule for diagnostic cardiac catheterization.  Hospital Course: He presented to [facility] on 02/17/12 and underwent cardiac cath revealing severe single vessel CAD with severe stenosis in the proximal LAD and in the proximal to mid stent with successful PTCA/DES to prox LAD. He tolerated the procedure well without complications. Recommendations were made for DAPT w/ ASA and Plavix for one year. He was able to ambulate without complaints of chest pain or sob. Groin site remained stable without bleeding, hematoma, or bruit. He was seen and evaluated by Dr. Clarita Leber who felt he was stable for discharge home with plans for follow up as scheduled below.  Discharge Vitals: Blood pressure 128/78, pulse 64, temperature 98.2 F (36.8 C), temperature source Oral, resp. rate 18, height 5\' 10"  (1.778 m), weight 186 lb 4.6 oz (84.5 kg), SpO2 98.00%.  Labs: Component Value Date   WBC 4.3 02/18/2012   HGB 14.0 02/18/2012   HCT 39.9 02/18/2012   MCV 90.5 02/18/2012   PLT 124* 02/18/2012    Lab 02/18/12 0415  NA 140  K 3.9  CL 102  CO2 28  BUN 15  CREATININE 1.00  CALCIUM 9.6  GLUCOSE 107*    Discharge Medications   Medication List  As of  02/18/2012  9:07 AM   STOP taking these medications         esomeprazole 40 MG capsule      lisinopril 10 MG tablet      naproxen sodium 220 MG tablet         TAKE these medications         amLODipine 10 MG tablet   Commonly known as: NORVASC   Take 1 tablet (10 mg total) by mouth daily.      amLODipine 10 MG tablet   Commonly known as: NORVASC   Take 10 mg by mouth daily.      aspirin 81 MG EC tablet   Take 81 mg by mouth daily. Swallow whole.      clopidogrel 75 MG tablet   Commonly known as: PLAVIX   Take 1 tablet (75 mg total) by mouth daily with breakfast.      fish oil-omega-3 fatty acids 1000 MG capsule   Take 1-2 g by mouth 2 (two) times daily. Take 1 capsule in the morning and 2 capsules in the evening      lisinopril-hydrochlorothiazide 10-12.5 MG per tablet   Commonly known as: PRINZIDE,ZESTORETIC   Take 1 tablet by mouth daily.      metoprolol succinate 50 MG 24 hr tablet   Commonly known as: TOPROL-XL   Take 1 tablet (50 mg total) by mouth daily.      metoprolol succinate 50 MG 24 hr tablet   Commonly known as: TOPROL-XL   Take 50 mg by mouth daily. Take with or immediately following a meal.      nitroGLYCERIN 0.4 MG SL tablet   Commonly known as: NITROSTAT   Place 1 tablet (0.4 mg total) under the tongue every 5 (five) minutes as needed for chest pain (up to 3 doses).      pantoprazole 40 MG tablet   Commonly known as: PROTONIX   Take 1 tablet (40 mg total) by mouth daily.      rosuvastatin 10 MG tablet   Commonly known as: CRESTOR   Take 10 mg by mouth daily.      TRILIPIX 135 MG capsule   Generic drug: Choline Fenofibrate   Take 135 mg by mouth daily.            Disposition   Discharge Orders    Future Appointments: Provider: Department: Dept Phone: Center:   03/16/2012 2:30 PM Kathlen Brunswick, MD [facility] [phone] [id number]     Future Orders Please Complete By Expires   Diet - low sodium heart healthy       Increase activity slowly      Discharge instructions      Comments:   **PLEASE REMEMBER TO BRING ALL OF YOUR MEDICATIONS TO EACH OF YOUR FOLLOW-UP OFFICE VISITS.  * KEEP GROIN SITE CLEAN AND DRY. Call the office for any signs of bleedings, pus, swelling, increased pain, or any other concerns. * NO HEAVY LIFTING (>10lbs) OR SEXUAL ACTIVITY X 7 DAYS. * NO DRIVING until Wednesday 8/46/96. * NO SOAKING BATHS, HOT TUBS, POOLS,  ETC., X 7 DAYS. * May return to work on Wednesday 02/22/12.  * Your nexium was changed to protonix due to interactions with your new plavix medication.     Follow-up Information    Follow up with [place] Bing, MD. (Our office will call you with your appointment time)    Contact information:   [facility] [address] [place] [address] [phone]           Outstanding Labs/Studies:  None  Duration of Discharge Encounter: Greater than 30 minutes including physician and PA time.  Signed, Loukisha Gunnerson PA-C 02/18/2012, 9:07 AM

## 2012-02-18 NOTE — Progress Notes (Signed)
Cardiology Progress Note Patient Name: [name] Date of Encounter: 02/18/2012, 8:05 AM     Subjective  Doing well after cath PTCA/DES to prox/mid LAD yesterday. No chest pain/sob. Awaiting cardiac rehab visit for ambulation. Rhythm stable.    Objective   Telemetry: Sinus rhythm 50-70s  Medications: . amLODipine  10 mg Oral Daily  . aspirin EC  81 mg Oral Daily  . bivalirudin      . clopidogrel  75 mg Oral Q breakfast  . fentaNYL      . hydrochlorothiazide  12.5 mg Oral Daily  . lisinopril  10 mg Oral Daily  . metoprolol succinate  50 mg Oral Daily  . midazolam      . rosuvastatin  10 mg Oral q1800   . sodium chloride 75 mL/hr at 02/17/12 0932    Physical Exam: Temp:  [97.7 F (36.5 C)-98.6 F (37 C)] 98.2 F (36.8 C) (06/15 0742) Pulse Rate:  [61-71] 64  (06/15 0742) Resp:  [12-19] 18  (06/15 0742) BP: (101-128)/(61-96) 128/78 mmHg (06/15 0742) SpO2:  [97 %-100 %] 98 % (06/15 0742) Weight:  [186 lb 4.6 oz (84.5 kg)] 186 lb 4.6 oz (84.5 kg) (06/15 0525)  General: Middle aged white male, in no acute distress. Head: Normocephalic, atraumatic, sclera non-icteric, nares are without discharge.  Neck: Supple. Negative for carotid bruits or JVD Lungs: Clear bilaterally to auscultation without wheezes, rales, or rhonchi. Breathing is unlabored. Heart: RRR S1 S2 without murmurs, rubs, or gallops.  Abdomen: Soft, non-tender, non-distended with normoactive bowel sounds. No rebound/guarding. No obvious abdominal masses. Msk:  Strength and tone appear normal for age. Extremities: Right groin without bleeding, hematoma or bruit. Trace BLE edema. No clubbing or cyanosis. Distal pedal pulses are intact and equal bilaterally. Neuro: Alert and oriented X 3. Moves all extremities spontaneously. Psych:  Responds to questions appropriately with a normal affect.   Intake/Output Summary (Last 24 hours) at 02/18/12 0805 Last data filed at 02/17/12 1804  Gross per 24 hour    Intake    935 ml  Output   1850 ml  Net   -915 ml    Labs:  [facility] 02/18/12 0415  NA 140  K 3.9  CL 102  CO2 28  GLUCOSE 107*  BUN 15  CREATININE 1.00  CALCIUM 9.6   Basename 02/18/12 0415  WBC 4.3  HGB 14.0  HCT 39.9  MCV 90.5  PLT 124*   Radiology/Studies:    02/17/12 - Cardiac Cath Hemodynamic Findings:  Central aortic pressure: 126/60  Left ventricular pressure: 126/14/23  Angiographic Findings:  Left main: No obstructive disease.  Left Anterior Descending Artery: Moderate sized vessel that courses to the apex. There is a stent present in the proximal to mid segment. There is a 95% stenosis in the proximal LAD just before the stented segment. There is 99% in stent restenosis throughout the stented segment. The distal LAD is disease free. There are two small caliber diagonal branches that are jailed by the stent and both have 90% ostial stenoses. This is unchanged from the last cath in 2007.  Circumflex Artery: Moderate sized vessel with no obstructive disease noted.  Right Coronary Artery: Moderate sized, dominant vessel with no obstructive disease noted.  Left Ventricular Angiogram: LVEF 55%.  Impression:  1. Severe single vessel CAD with severe stenosis in the proximal LAD and in the proximal to mid stent.  2. Unstable angina  3. Preserved LV systolic function.  Recommendations:  Will plan PCI today with DES to the LAD.  02/17/12 - Cardiac Cath Impression:  1. Successful PTCA/DES x 1 proximal and mid LAD  Recommendations: ASA and Plavix for one year.     Assessment and Plan  61 y.o. male w/ PMHx significant for CAD and HLD who presented to [facility] on [facility]/17[facility]12 for diagnostic cath after abnormal stress test.  1. Coronary Artery Disease: s/p PTCA/DES x1 to prox/mid LAD yesterday. Patient did well overnight, no events, groin without bleeding, hematoma, or bruit. No complaints of chest pain or sob. Awaiting visit from cardiac rehab. Cont ASA, Plavix,   BB, ACEI, and statin.  2. HLD: Cont statin  3. GERD: Change nexium to protonix due to interaction with plavix  Disposition: Likely home today after evaluated by Dr. Patty Sermons.  Signed, HOPE, JESSICA PA-C  Patient is doing well.  No chest pain.  Ok for discharge today and he will return to work and start driving Wednesday June 19.  See Dr. Dietrich Pates in office in several weeks.

## 2012-02-18 NOTE — Plan of Care (Signed)
Problem: Consults Goal: PCI Patient Education (See Patient Education module for education specifics.) Outcome: Completed/Met Date Met:  02/18/12 Patient states good understanding and denies questions at time regarding stent information   Problem: Phase II Progression Outcomes Goal: Pain controlled Outcome: Completed/Met Date Met:  02/18/12 Painfree post PCI Goal: OOB to chair per PCI oders Outcome: Completed/Met Date Met:  02/18/12 OOB to chair and up in room tolerating activity with no complaints Goal: Vascular site scale level 0 - I Vascular Site Scale Level 0: No bruising/bleeding/hematoma Level I (Mild): Bruising/Ecchymosis, minimal bleeding/ooozing, palpable hematoma < 3 cm Level II (Moderate): Bleeding not affecting hemodynamic parameters, pseudoaneurysm, palpable hematoma > 3 cm Outcome: Completed/Met Date Met:  02/18/12 Right groin level 0 Goal: No post PCI angina Outcome: Completed/Met Date Met:  02/18/12 No reported angina, patient states he is painfree Goal: Distal pulses equal baseline assessment Outcome: Completed/Met Date Met:  02/18/12 Palpable pulses, radial and pedal bilaterally Goal: Discharge plan established Outcome: Completed/Met Date Met:  02/18/12 Discharge planned for tomorrow, no complications at this time Goal: Tolerating diet Outcome: Completed/Met Date Met:  02/18/12 Tolerated dinner and an evening snack with no complaints

## 2012-02-18 NOTE — Progress Notes (Signed)
CARDIAC REHAB PHASE I   PRE:  Rate/Rhythm: SR 70  BP:  Supine:   Sitting:  134/66  Standing:    SaO2: RA  MODE:  Ambulation: 400 ft   POST:  Rate/Rhythem:   BP:  Supine:   Sitting: 134/70  Standing:    SaO2: RA  [name]  Ambulated with no complaints of cp or sob.  Pt with minor complaints of back discomfort.  Pt to side of bed awaiting wife's arrival for discharge.  Education completed at bedside.   Pt demonstrated good understanding of heart healthy lifestyle. Pt declines participation in Outpatient Cardiac Rehab program in [place] due to limitations of exercise due to his back.

## 2012-02-20 ENCOUNTER — Telehealth: Payer: Self-pay | Admitting: Cardiology

## 2012-02-20 MED FILL — Dextrose Inj 5%: INTRAVENOUS | Qty: 50 | Status: AC

## 2012-02-20 NOTE — Telephone Encounter (Signed)
PT WIFE CALLED BACK AGAIN TO LET us KNOW THAT HIS BP 87/59 AROUND LUNCH TIME. TOOK ALL HIS MEDS BETWEEN 7-8 AM. PT HAS BEEN FEELING A LITTLE LIGHT HEADED AND THINKS HE DOESNT NEED TO BE TAKING ALL THREE BP MEDS

## 2012-02-20 NOTE — Telephone Encounter (Signed)
Pt gets steroid injections in his back, he is scheduled for one 02/21/12 is he able to get them still, now that he is on plavix?

## 2012-02-20 NOTE — Telephone Encounter (Signed)
Patient's wife called with medication questions. Please call her back. / tg

## 2012-02-21 NOTE — Telephone Encounter (Signed)
Hold amlodipine; resume lisinopril.  If hypertension recurs, resume amlodipine at 2.5 mg per day.   Patient cannot receive epidural injections while taking clopidogrel, and he cannot stop clopidogrel.

## 2012-02-21 NOTE — Telephone Encounter (Signed)
Spoke with wife and states she will hold his lisinopril until we contact her with recommendations.

## 2012-02-22 ENCOUNTER — Other Ambulatory Visit: Payer: Self-pay | Admitting: *Deleted

## 2012-02-22 ENCOUNTER — Telehealth: Payer: Self-pay | Admitting: Cardiology

## 2012-02-22 NOTE — Telephone Encounter (Signed)
Patient needs letter for work stating he has no restrictions. / tg Patient wants to know if the letter can be faxed to his wife.  The number if [phone]. / tg

## 2012-02-22 NOTE — Telephone Encounter (Signed)
**Note De-Identified Alexey Rhoads Obfuscation** LMOM. Need to advise pt. that we were able to get him in sooner than 6-27. He is now scheduled to see Joni Reining, NP on 6-21 at 1:00 and a return to work note can be given at that time./LV

## 2012-02-22 NOTE — Telephone Encounter (Signed)
Advised patient's wife of recommendations.  Verbalized understanding.  Will cc to Dr Juanetta Gosling.

## 2012-02-23 ENCOUNTER — Encounter: Payer: Self-pay | Admitting: Adult Health

## 2012-02-24 ENCOUNTER — Encounter: Payer: Self-pay | Admitting: Adult Health

## 2012-02-24 ENCOUNTER — Ambulatory Visit (INDEPENDENT_AMBULATORY_CARE_PROVIDER_SITE_OTHER): Payer: Managed Care, Other (non HMO) | Admitting: Adult Health

## 2012-02-24 ENCOUNTER — Encounter: Payer: Self-pay | Admitting: *Deleted

## 2012-02-24 VITALS — BP 82/30 | HR 64 | Ht 70.0 in | Wt 189.0 lb

## 2012-02-24 DIAGNOSIS — R079 Chest pain, unspecified: Secondary | ICD-10-CM

## 2012-02-24 DIAGNOSIS — I209 Angina pectoris, unspecified: Secondary | ICD-10-CM

## 2012-02-24 DIAGNOSIS — I10 Essential (primary) hypertension: Secondary | ICD-10-CM

## 2012-02-24 MED ORDER — PANTOPRAZOLE SODIUM 40 MG PO TBEC: 40.0000 mg | DELAYED_RELEASE_TABLET | Freq: Every day | ORAL | Status: DC

## 2012-02-24 MED ORDER — LISINOPRIL 5 MG PO TABS: 5.0000 mg | ORAL_TABLET | Freq: Every day | ORAL | Status: DC

## 2012-02-24 MED ORDER — CLOPIDOGREL BISULFATE 75 MG PO TABS: 75.0000 mg | ORAL_TABLET | Freq: Every day | ORAL | Status: DC

## 2012-02-24 NOTE — Patient Instructions (Addendum)
Your physician recommends that you schedule a follow-up appointment in:  1 - 1 month with provider 2 - 1 week with nurse for Blood pressure check  Your physician has recommended you make the following change in your medication:  1 - STOP Lisinopril.HCT 2 - START Lisinopril 5 mg daily   Your physician recommends that you return for lab work in: Within the week

## 2012-02-24 NOTE — Progress Notes (Signed)
States having continued exertional chest pressure, relieved by Ntg periodically.

## 2012-02-25 ENCOUNTER — Encounter: Payer: Self-pay | Admitting: Adult Health

## 2012-02-25 NOTE — Assessment & Plan Note (Signed)
He is found to be significantly orthostatic on evaluation. I will decrease his medications by eliminating diuretic component of ACE and decreasing dose to lisinopril 5 mg daily. HR was stable during orthostatic BP checks and therefore will not decrease BB dose . BMET is to follow.

## 2012-02-25 NOTE — Progress Notes (Signed)
HPI: Mr Quesnell is a 61 y/o patient of Dr. Dietrich Pates we are seeing post hospitalization where he was admitted for cardiac cath in the setting of unstable angina. He was found to have severe proximal LAD stenosis with mid segment in-stent stenosis.He has a Promus DES in the proximal and mid LAD per Dr. Clifton James completely covering the old stent. He is on DAPT with Plavix and ASA. Since discharge he has been having dizziness and mild chest discomfort. He has been taking BP at home and found it to be in the low 90-high 80's systolic. He denies pre-syncope or shortness of breath. I have had orthostatic BP completed prior to my assessment.  Allergies  Allergen Reactions  . Crestor (Rosuvastatin) Other (See Comments)    Myalgias; also Vytorin--  PT CAN SOMEWHAT TOLERATE CRESTOR 10 mg  . Vytorin (Ezetimibe-Simvastatin) Other (See Comments)    Myalgias; also rosuvastatin     Current Outpatient Prescriptions  Medication Sig Dispense Refill  . Choline Fenofibrate (TRILIPIX) 135 MG capsule Take 135 mg by mouth daily.      . clopidogrel (PLAVIX) 75 MG tablet Take 1 tablet (75 mg total) by mouth daily with breakfast.  90 tablet  3  . fish oil-omega-3 fatty acids 1000 MG capsule Take 1-2 g by mouth 2 (two) times daily. Take 1 capsule in the morning and 2 capsules in the evening      . metoprolol succinate (TOPROL-XL) 50 MG 24 hr tablet Take 50 mg by mouth daily. Take with or immediately following a meal.      . nitroGLYCERIN (NITROSTAT) 0.4 MG SL tablet Place 1 tablet (0.4 mg total) under the tongue every 5 (five) minutes as needed for chest pain (up to 3 doses).  25 tablet  3  . pantoprazole (PROTONIX) 40 MG tablet Take 1 tablet (40 mg total) by mouth daily.  90 tablet  3  . rosuvastatin (CRESTOR) 10 MG tablet Take 10 mg by mouth daily.      Marland Kitchen aspirin 81 MG tablet Take 81 mg by mouth daily.      Marland Kitchen lisinopril (PRINIVIL,ZESTRIL) 5 MG tablet Take 1 tablet (5 mg total) by mouth daily.  90 tablet  3    Past  Medical History  Diagnosis Date  . 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 Tobacco abuse     remote; also remote moderate alcohol consumption  . Hyperlipidemia     triglycerides greater than 1000  . Coronary artery disease   . Myocardial infarction   . Anginal pain   . Pneumonia     hx of PNA    Past Surgical History  Procedure Date  . Cardiac catheterization   . Coronary angioplasty with stent placement 02/17/2012     proximal LAD    ZOX:WRUEAV of systems complete and found to be negative unless listed above  PHYSICAL EXAM BP 82/30  Pulse 64  Ht 5\' 10"  (1.778 m)  Wt 189 lb (85.73 kg)  BMI 27.12 kg/m2  SpO2 99%  General: Well developed, well nourished, in no acute distress Head: Eyes PERRLA, No xanthomas.   Normal cephalic and atramatic  Lungs: Clear bilaterally to auscultation and percussion. Heart: HRRR S1 S2, without MRG.  Pulses are 2+ & equal.            No  carotid bruit. No JVD.  No abdominal bruits. No femoral bruits. Abdomen: Bowel sounds are positive, abdomen soft and non-tender without masses or                  Hernia's noted. Msk:  Back normal, normal gait. Normal strength and tone for age. Extremities: No clubbing, cyanosis or edema.  DP +1 Neuro: Alert and oriented X 3. Psych:  Good affect, responds appropriately  EKG:NSR rate of 62 bpm  ASSESSMENT AND PLAN

## 2012-02-25 NOTE — Assessment & Plan Note (Signed)
This could possibly be from hypoperfusion from hypotension. Will re-evaluate symptoms on follow up. IF continues may need to be re-studied. Cath report states that previously jailed diagonals from old stent to LAD are now open with new stent placement. Will follow

## 2012-03-01 ENCOUNTER — Encounter: Payer: Managed Care, Other (non HMO) | Admitting: Physician Assistant

## 2012-03-02 ENCOUNTER — Encounter: Payer: Self-pay | Admitting: *Deleted

## 2012-03-02 ENCOUNTER — Ambulatory Visit (INDEPENDENT_AMBULATORY_CARE_PROVIDER_SITE_OTHER): Payer: Managed Care, Other (non HMO) | Admitting: *Deleted

## 2012-03-02 VITALS — BP 142/82 | HR 67 | Ht 70.0 in | Wt 186.0 lb

## 2012-03-02 DIAGNOSIS — I951 Orthostatic hypotension: Secondary | ICD-10-CM

## 2012-03-02 NOTE — Progress Notes (Signed)
Presents today for blood pressure check, as he was hypotensive at previous office visit with orthostatic hypotension.  Lininopril/HCT was discontinued at last visit.  Brings multiple blood pressure readings from home, which were satisfactory and states that he has no complaints at this time.  Paperwork completed for return to work by Joni Reining, NP.

## 2012-03-05 ENCOUNTER — Telehealth: Payer: Self-pay | Admitting: Cardiology

## 2012-03-05 NOTE — Telephone Encounter (Signed)
Patient states that his BP has been running high and when he walks anywhere he experiences chest pain.  Wants return phone call. / tg

## 2012-03-06 ENCOUNTER — Other Ambulatory Visit: Payer: Self-pay | Admitting: Cardiology

## 2012-03-06 NOTE — Telephone Encounter (Signed)
Attempted to call patient.  Will need a nurse visit this week for assessment.

## 2012-03-06 NOTE — Telephone Encounter (Signed)
Appointment made for nurse visit for 7/3

## 2012-03-07 LAB — COMPREHENSIVE METABOLIC PANEL
ALT: 22 U/L (ref 0–53)
Alkaline Phosphatase: 31 U/L — ABNORMAL LOW (ref 39–117)
CO2: 27 mEq/L (ref 19–32)
Creat: 1.04 mg/dL (ref 0.50–1.35)
Sodium: 141 mEq/L (ref 135–145)
Total Bilirubin: 0.6 mg/dL (ref 0.3–1.2)
Total Protein: 6.8 g/dL (ref 6.0–8.3)

## 2012-03-07 LAB — LIPID PANEL
Cholesterol: 187 mg/dL (ref 0–200)
LDL Cholesterol: 69 mg/dL (ref 0–99)
Total CHOL/HDL Ratio: 4.2 Ratio
VLDL: 73 mg/dL — ABNORMAL HIGH (ref 0–40)

## 2012-03-09 ENCOUNTER — Encounter: Payer: Self-pay | Admitting: Cardiology

## 2012-03-14 ENCOUNTER — Encounter: Payer: Self-pay | Admitting: Cardiology

## 2012-03-14 ENCOUNTER — Ambulatory Visit (INDEPENDENT_AMBULATORY_CARE_PROVIDER_SITE_OTHER): Payer: Managed Care, Other (non HMO)

## 2012-03-14 VITALS — BP 141/72 | HR 75 | Ht 70.0 in | Wt 189.0 lb

## 2012-03-14 DIAGNOSIS — I10 Essential (primary) hypertension: Secondary | ICD-10-CM

## 2012-03-14 NOTE — Progress Notes (Signed)
S: Pt. Arrives in office for a BP check per pt's request. B: At last OV with Joni Reining, NP on 6-21 pt. Was advised to stop taking Lisinopril/HCT 10/12.5 mg daily and start taking Lisinopril 5 mg daily due to pt. Being significantly orthostatic with a BP of 82/30. Pt. Also had BP check on 6-28 with BP of 142/82 and had no complaints at that time. A: Pt. C/o exertional CP and states that he has to take NTG often. His BP this morning is 141/72 which is similar to the multiple BP readings he brought with him to BP check today (Scanned into pt's chart). R: Appt. Scheduled for pt. To see Jacolyn Reedy, PAC on 7-17 at 11:40 to discuss medications and BP concerns. Pt. Advised that if cp worsens to call 911, he verbalized understanding./LV

## 2012-03-15 ENCOUNTER — Telehealth: Payer: Self-pay | Admitting: Cardiology

## 2012-03-15 MED ORDER — AMLODIPINE BESYLATE 2.5 MG PO TABS: 2.5000 mg | ORAL_TABLET | Freq: Every day | ORAL | Status: DC

## 2012-03-15 NOTE — Telephone Encounter (Addendum)
Per PN on 6-17 pt's wife was advised per Dr. Dietrich Pates that pt. should stop Amlodipine and to resume Lisinopril due to low BP and that if hypertension recurs to resume Amlodipine at 2.5 mg daily. Wife advised, she verbalized  And RX sent to [facility] in [place] to fill./LV

## 2012-03-15 NOTE — Telephone Encounter (Signed)
PT WIFE WOULD LIKE TO SPEAK TO NURSE ABOUT HIS ANGINA, STATES SHE WOULD LIKE [place] TO PUT HIM ON ANOTHER MEDICATION TO HELP WITH UT UNTIL HE IS SEEN.  HAS APPT WITH MICHELLE LENZE7/17/13

## 2012-03-15 NOTE — Telephone Encounter (Signed)
**Note De-Identified [name] Obfuscation** Called pt's wife but was put on hold for a long while, will continue to call./LV

## 2012-03-16 ENCOUNTER — Ambulatory Visit: Payer: Managed Care, Other (non HMO) | Admitting: Cardiology

## 2012-03-19 ENCOUNTER — Encounter: Payer: Self-pay | Admitting: *Deleted

## 2012-03-21 ENCOUNTER — Ambulatory Visit (INDEPENDENT_AMBULATORY_CARE_PROVIDER_SITE_OTHER): Payer: Managed Care, Other (non HMO) | Admitting: Physician Assistant

## 2012-03-21 ENCOUNTER — Encounter: Payer: Self-pay | Admitting: Physician Assistant

## 2012-03-21 VITALS — BP 110/66 | HR 64 | Ht 70.0 in | Wt 188.8 lb

## 2012-03-21 DIAGNOSIS — Z01818 Encounter for other preprocedural examination: Secondary | ICD-10-CM

## 2012-03-21 DIAGNOSIS — I10 Essential (primary) hypertension: Secondary | ICD-10-CM

## 2012-03-21 DIAGNOSIS — I209 Angina pectoris, unspecified: Secondary | ICD-10-CM

## 2012-03-21 DIAGNOSIS — I2510 Atherosclerotic heart disease of native coronary artery without angina pectoris: Secondary | ICD-10-CM

## 2012-03-21 DIAGNOSIS — I7090 Unspecified atherosclerosis: Secondary | ICD-10-CM

## 2012-03-21 DIAGNOSIS — R079 Chest pain, unspecified: Secondary | ICD-10-CM

## 2012-03-21 LAB — CBC WITH DIFFERENTIAL/PLATELET
Basophils Absolute: 0 10*3/uL (ref 0.0–0.1)
Basophils Relative: 1 % (ref 0–1)
Eosinophils Absolute: 0.1 10*3/uL (ref 0.0–0.7)
Eosinophils Relative: 3 % (ref 0–5)
Hemoglobin: 13.5 g/dL (ref 13.0–17.0)
MCV: 89.8 fL (ref 78.0–100.0)
Monocytes Absolute: 0.4 10*3/uL (ref 0.1–1.0)
Neutro Abs: 2.7 10*3/uL (ref 1.7–7.7)
Neutrophils Relative %: 68 % (ref 43–77)
Platelets: 146 10*3/uL — ABNORMAL LOW (ref 150–400)
RBC: 4.31 MIL/uL (ref 4.22–5.81)
WBC: 3.9 10*3/uL — ABNORMAL LOW (ref 4.0–10.5)

## 2012-03-21 LAB — BASIC METABOLIC PANEL
BUN: 18 mg/dL (ref 6–23)
Creat: 0.95 mg/dL (ref 0.50–1.35)
Potassium: 4 mEq/L (ref 3.5–5.3)

## 2012-03-21 MED ORDER — AMLODIPINE BESYLATE 5 MG PO TABS: 5.0000 mg | ORAL_TABLET | Freq: Every day | ORAL | Status: DC

## 2012-03-21 NOTE — Addendum Note (Signed)
Addended by: Reather Laurence A on: 03/21/2012 03:18 PM   Modules accepted: Orders

## 2012-03-21 NOTE — Patient Instructions (Addendum)
Your physician has requested that you have a cardiac catheterization. Cardiac catheterization is used to diagnose and/or treat various heart conditions. Doctors may recommend this procedure for a number of different reasons. The most common reason is to evaluate chest pain. Chest pain can be a symptom of coronary artery disease (CAD), and cardiac catheterization can show whether plaque is narrowing or blocking your heart's arteries. This procedure is also used to evaluate the valves, as well as measure the blood flow and oxygen levels in different parts of your heart. For further information please visit [email or web address] Please follow instruction sheet, as given.  Your physician recommends that you return for lab work in: today  Your physician recommends that you schedule a follow-up appointment in: 1 week

## 2012-03-21 NOTE — Assessment & Plan Note (Signed)
Recent drug-eluting stent to the LAD for in-stent restenosis with residual jailed diagonals. Please see above for details.

## 2012-03-21 NOTE — Progress Notes (Signed)
Pt. was seen by Jacolyn Reedy, PAC on 7-17 and a Cath was ordered. Cath is scheduled for 7-22 with Dr. Sanjuana Kava in [facility] at 11:30./LV

## 2012-03-21 NOTE — Progress Notes (Signed)
HPI: This is a 60-year-old white male patient who has history of coronary artery disease status post PTCA/drug-eluting stent x1 to the proximal and mid LAD for in-stent restenosis on 02/17/12. There are 2 small caliber diagonal branches that are jailed by the stent and both have 90% ostial stenosis. This is unchanged from the last catheter in 2007. His RCA and circumflex were free of disease and ejection fraction was 55%.  Since the patient's been home he complains of regular angina. It has progressively worsened and he can only walk a proximally 100 yards on level ground before he gets chest pain. It eases immediately with rest. If he walks up an incline he can only go 50 yards. He saw Catherine Lawrence last week and was found to be orthostatic. His diuretic was stopped and Zestril decreased. Later in the week Norvasc was added because of the patient's request.The patient is no longer dizzy or orthostatic but is very concerned about his chest pain. 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 He denies radiation of the pain, dyspnea, dizziness, palpitations, or presyncope. Allergies  Allergen Reactions  . Crestor (Rosuvastatin) Other (See Comments)    Myalgias; also Vytorin--  PT CAN SOMEWHAT TOLERATE CRESTOR 10 mg  . Vytorin (Ezetimibe-Simvastatin) Other (See Comments)    Myalgias; also rosuvastatin     Current Outpatient Prescriptions on File Prior to Visit  Medication Sig Dispense Refill  . amLODipine (NORVASC) 2.5 MG tablet Take 1 tablet (2.5 mg total) by mouth daily.  30 tablet  6  . aspirin 81 MG tablet Take 81 mg by mouth daily.      . Choline Fenofibrate (TRILIPIX) 135 MG capsule Take 135 mg by mouth daily.      . clopidogrel (PLAVIX) 75 MG tablet Take 1 tablet (75 mg total) by mouth daily with breakfast.  90 tablet  3  . lisinopril (PRINIVIL,ZESTRIL) 5 MG tablet Take 1 tablet (5 mg total) by mouth daily.  90 tablet  3  . metoprolol succinate (TOPROL-XL) 50  MG 24 hr tablet Take 50 mg by mouth daily. Take with or immediately following a meal.      . nitroGLYCERIN (NITROSTAT) 0.4 MG SL tablet Place 1 tablet (0.4 mg total) under the tongue every 5 (five) minutes as needed for chest pain (up to 3 doses).  25 tablet  3  . pantoprazole (PROTONIX) 40 MG tablet Take 1 tablet (40 mg total) by mouth daily.  90 tablet  3  . rosuvastatin (CRESTOR) 10 MG tablet Take 10 mg by mouth daily.        Past Medical History  Diagnosis Date  . 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 Tobacco abuse     remote; also remote moderate alcohol consumption  . Hyperlipidemia     triglycerides greater than 1000  . Coronary artery disease   . Myocardial infarction   . Anginal pain   . Pneumonia     hx of PNA    Past Surgical History  Procedure Date  . Cardiac catheterization   . Coronary angioplasty with stent placement 02/17/2012     proximal LAD    No family history on file.  History   Social History  . Marital Status: Married    Spouse Name:   N/A    Number of Children: N/A  . Years of Education: N/A   Occupational History  . Not on file.   Social History Main Topics  . Smoking status: Former Smoker    Quit date: 09/05/1998  . Smokeless tobacco: Never Used  . Alcohol Use: Yes     OCCASIONAL  . Drug Use: No  . Sexually Active: Not on file   Other Topics Concern  . Not on file   Social History Narrative  . No narrative on file    ROS: See HPI Eyes: glasses Ears:Negative for hearing loss, tinnitus Cardiovascular: Negative for chest pain, palpitations,irregular heartbeat, dyspnea, dyspnea on exertion, near-syncope, orthopnea, paroxysmal nocturnal dyspnea and syncope,edema, claudication, cyanosis,.  Respiratory:   Negative for cough, hemoptysis, shortness of breath, sleep  disturbances due to breathing, sputum production and wheezing.   Endocrine: Negative for cold intolerance and heat intolerance.  Hematologic/Lymphatic: Negative for adenopathy and bleeding problem. Does not bruise/bleed easily.  Musculoskeletal: chronic back pain for which he is receiving injections and has a new evaluation next week.   Gastrointestinal: reflux symptoms treated,Negative for nausea, vomiting, abdominal pain, diarrhea, constipation.   Neurological: Negative.  Allergic/Immunologic: Negative for environmental allergies.   PHYSICAL EXAM: Well-nournished, in no acute distress. HEENT:Head is normocephalic without sign of trauma, extraocular eye movements are intact, pupils were equal and reactive to light accommodation, nasal mucosa is moist, throat is without erythema or exudate Neck: No JVD, HJR, Bruit, or thyroid enlargement Lungs: No tachypnea, clear without wheezing, rales, or rhonchi Cardiovascular: RRR, PMI not displaced, heart sounds normal, no murmurs, gallops, bruit, thrill, or heave. Abdomen: BS normal. Soft without organomegaly, masses, lesions or tenderness. Extremities: without cyanosis, clubbing or edema. Good distal pulses bilateral SKin: Warm, no lesions or rashes  Musculoskeletal: No deformities Neuro: no focal signs  BP 110/66  Pulse 64  Ht 5' 10" (1.778 m)  Wt 188 lb 12.8 oz (85.639 kg)  BMI 27.09 kg/m2  EKG:normal sinus rhythm no acute change   02/17/12 - Cardiac Cath Hemodynamic Findings:   Central aortic pressure: 126/60   Left ventricular pressure: 126/14/23   Angiographic Findings:   Left main: No obstructive disease.   Left Anterior Descending Artery: Moderate sized vessel that courses to the apex. There is a stent present in the proximal to mid segment. There is a 95% stenosis in the proximal LAD just before the stented segment. There is 99% in stent restenosis throughout the stented segment. The distal LAD is disease free. There are two small  caliber diagonal branches that are jailed by the stent and both have 90% ostial stenoses. This is unchanged from the last cath in 2007.   Circumflex Artery: Moderate sized vessel with no obstructive disease noted.   Right Coronary Artery: Moderate sized, dominant vessel with no obstructive disease noted.   Left Ventricular Angiogram: LVEF 55%.   Impression:  1. Severe single vessel CAD with severe stenosis in the proximal LAD and in the proximal to mid stent.   2. Unstable angina   3. Preserved LV systolic function.   Recommendations: Will plan PCI today with DES to the LAD.  02/17/12 - Cardiac Cath Impression:  1. Successful PTCA/DES x 1 proximal and mid LAD   Recommendations: ASA and Plavix for one year.   

## 2012-03-21 NOTE — Assessment & Plan Note (Signed)
stable °

## 2012-03-21 NOTE — Assessment & Plan Note (Signed)
Patient has had worsening angina daily since his most recent stent on 02/17/12. I've discussed this patient with Dr. Dietrich Pates. He recommends repeat cardiac catheterization, to rule out in-stent restenosis. We will also increase his amlodipine 5 mg daily. If he significantly improves on a higher dose amlodipine he will follow up with Dr. Dietrich Pates next week and forgo the cardiac catheterization.

## 2012-03-26 ENCOUNTER — Inpatient Hospital Stay (HOSPITAL_BASED_OUTPATIENT_CLINIC_OR_DEPARTMENT_OTHER)
Admission: RE | Admit: 2012-03-26 | Discharge: 2012-03-26 | Disposition: A | Discharge status: Home / Self Care | Payer: Managed Care, Other (non HMO) | Source: Ambulatory Visit | Attending: Cardiovascular Disease | Admitting: Cardiovascular Disease

## 2012-03-26 ENCOUNTER — Encounter (HOSPITAL_BASED_OUTPATIENT_CLINIC_OR_DEPARTMENT_OTHER)
Admission: RE | Disposition: A | Discharge status: Home / Self Care | Payer: Self-pay | Source: Ambulatory Visit | Attending: Cardiovascular Disease

## 2012-03-26 DIAGNOSIS — I2510 Atherosclerotic heart disease of native coronary artery without angina pectoris: Secondary | ICD-10-CM

## 2012-03-26 DIAGNOSIS — Z9861 Coronary angioplasty status: Secondary | ICD-10-CM | POA: Insufficient documentation

## 2012-03-26 DIAGNOSIS — R079 Chest pain, unspecified: Secondary | ICD-10-CM

## 2012-03-26 SURGERY — JV LEFT HEART CATHETERIZATION WITH CORONARY ANGIOGRAM
Anesthesia: Moderate Sedation

## 2012-03-26 MED ORDER — SODIUM CHLORIDE 0.9 % IV SOLN: INTRAVENOUS | Status: AC

## 2012-03-26 MED ORDER — SODIUM CHLORIDE 0.9 % IJ SOLN: 3.0000 mL | Freq: Two times a day (BID) | INTRAMUSCULAR | Status: DC

## 2012-03-26 MED ORDER — SODIUM CHLORIDE 0.9 % IJ SOLN: 3.0000 mL | INTRAMUSCULAR | Status: DC | PRN

## 2012-03-26 MED ORDER — ACETAMINOPHEN 325 MG PO TABS: 650.0000 mg | ORAL_TABLET | ORAL | Status: DC | PRN

## 2012-03-26 MED ORDER — ASPIRIN 81 MG PO CHEW
324.0000 mg | CHEWABLE_TABLET | ORAL | Status: AC
  Administered 2012-03-26: 324 mg via ORAL

## 2012-03-26 MED ORDER — SODIUM CHLORIDE 0.9 % IV SOLN
250.0000 mL | INTRAVENOUS | Status: DC | PRN
  Administered 2012-03-26: 250 mL via INTRAVENOUS

## 2012-03-26 NOTE — H&P (View-Only) (Signed)
HPI: This is a 61 year old white male patient who has history of coronary artery disease status post PTCA/drug-eluting stent x1 to the proximal and mid LAD for in-stent restenosis on 02/17/12. There are 2 small caliber diagonal branches that are jailed by the stent and both have 90% ostial stenosis. This is unchanged from the last catheter in 2007. His RCA and circumflex were free of disease and ejection fraction was 55%.  Since the patient's been home he complains of regular angina. It has progressively worsened and he can only walk a proximally 100 yards on level ground before he gets chest pain. It eases immediately with rest. If he walks up an incline he can only go 50 yards. He saw [name] last week and was found to be orthostatic. His diuretic was stopped and Zestril decreased. Later in the week Norvasc was added because of the patient's request.The patient is no longer dizzy or orthostatic but is very concerned about his chest pain. 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 He denies radiation of the pain, dyspnea, dizziness, palpitations, or presyncope. Allergies  Allergen Reactions  . Crestor (Rosuvastatin) Other (See Comments)    Myalgias; also Vytorin--  PT CAN SOMEWHAT TOLERATE CRESTOR 10 mg  . Vytorin (Ezetimibe-Simvastatin) Other (See Comments)    Myalgias; also rosuvastatin     Current Outpatient Prescriptions on File Prior to Visit  Medication Sig Dispense Refill  . amLODipine (NORVASC) 2.5 MG tablet Take 1 tablet (2.5 mg total) by mouth daily.  30 tablet  6  . aspirin 81 MG tablet Take 81 mg by mouth daily.      . Choline Fenofibrate (TRILIPIX) 135 MG capsule Take 135 mg by mouth daily.      . clopidogrel (PLAVIX) 75 MG tablet Take 1 tablet (75 mg total) by mouth daily with breakfast.  90 tablet  3  . lisinopril (PRINIVIL,ZESTRIL) 5 MG tablet Take 1 tablet (5 mg total) by mouth daily.  90 tablet  3  . metoprolol succinate (TOPROL-XL) 50  MG 24 hr tablet Take 50 mg by mouth daily. Take with or immediately following a meal.      . nitroGLYCERIN (NITROSTAT) 0.4 MG SL tablet Place 1 tablet (0.4 mg total) under the tongue every 5 (five) minutes as needed for chest pain (up to 3 doses).  25 tablet  3  . pantoprazole (PROTONIX) 40 MG tablet Take 1 tablet (40 mg total) by mouth daily.  90 tablet  3  . rosuvastatin (CRESTOR) 10 MG tablet Take 10 mg by mouth daily.        Past Medical History  Diagnosis Date  . 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 Tobacco abuse     remote; also remote moderate alcohol consumption  . Hyperlipidemia     triglycerides greater than 1000  . Coronary artery disease   . Myocardial infarction   . Anginal pain   . Pneumonia     hx of PNA    Past Surgical History  Procedure Date  . Cardiac catheterization   . Coronary angioplasty with stent placement 02/17/2012     proximal LAD    No family history on file.  History   Social History  . Marital Status: Married    Spouse Name:  N/A    Number of Children: N/A  . Years of Education: N/A   Occupational History  . Not on file.   Social History Main Topics  . Smoking status: Former Smoker    Quit date: 09/05/1998  . Smokeless tobacco: Never Used  . Alcohol Use: Yes     OCCASIONAL  . Drug Use: No  . Sexually Active: Not on file   Other Topics Concern  . Not on file   Social History Narrative  . No narrative on file    ROS: See HPI Eyes: glasses Ears:Negative for hearing loss, tinnitus Cardiovascular: Negative for chest pain, palpitations,irregular heartbeat, dyspnea, dyspnea on exertion, near-syncope, orthopnea, paroxysmal nocturnal dyspnea and syncope,edema, claudication, cyanosis,.  Respiratory:   Negative for cough, hemoptysis, shortness of breath, sleep  disturbances due to breathing, sputum production and wheezing.   Endocrine: Negative for cold intolerance and heat intolerance.  Hematologic/Lymphatic: Negative for adenopathy and bleeding problem. Does not bruise/bleed easily.  Musculoskeletal: chronic back pain for which he is receiving injections and has a new evaluation next week.   Gastrointestinal: reflux symptoms treated,Negative for nausea, vomiting, abdominal pain, diarrhea, constipation.   Neurological: Negative.  Allergic/Immunologic: Negative for environmental allergies.   PHYSICAL EXAM: Well-nournished, in no acute distress. HEENT:Head is normocephalic without sign of trauma, extraocular eye movements are intact, pupils were equal and reactive to light accommodation, nasal mucosa is moist, throat is without erythema or exudate Neck: No JVD, HJR, Bruit, or thyroid enlargement Lungs: No tachypnea, clear without wheezing, rales, or rhonchi Cardiovascular: RRR, PMI not displaced, heart sounds normal, no murmurs, gallops, bruit, thrill, or heave. Abdomen: BS normal. Soft without organomegaly, masses, lesions or tenderness. Extremities: without cyanosis, clubbing or edema. Good distal pulses bilateral SKin: Warm, no lesions or rashes  Musculoskeletal: No deformities Neuro: no focal signs  BP 110/66  Pulse 64  Ht 5\' 10"  (1.778 m)  Wt 188 lb 12.8 oz (85.639 kg)  BMI 27.09 kg/m2  GEX:BMWUXL sinus rhythm no acute change   02/17/12 - Cardiac Cath Hemodynamic Findings:   Central aortic pressure: 126/60   Left ventricular pressure: 126/14/23   Angiographic Findings:   Left main: No obstructive disease.   Left Anterior Descending Artery: Moderate sized vessel that courses to the apex. There is a stent present in the proximal to mid segment. There is a 95% stenosis in the proximal LAD just before the stented segment. There is 99% in stent restenosis throughout the stented segment. The distal LAD is disease free. There are two small  caliber diagonal branches that are jailed by the stent and both have 90% ostial stenoses. This is unchanged from the last cath in 2007.   Circumflex Artery: Moderate sized vessel with no obstructive disease noted.   Right Coronary Artery: Moderate sized, dominant vessel with no obstructive disease noted.   Left Ventricular Angiogram: LVEF 55%.   Impression:  1. Severe single vessel CAD with severe stenosis in the proximal LAD and in the proximal to mid stent.   2. Unstable angina   3. Preserved LV systolic function.   Recommendations: Will plan PCI today with DES to the LAD.  02/17/12 - Cardiac Cath Impression:  1. Successful PTCA/DES x 1 proximal and mid LAD   Recommendations: ASA and Plavix for one year.

## 2012-03-26 NOTE — Interval H&P Note (Signed)
History and Physical Interval Note:  03/26/2012 10:51 AM  [name]  has presented today for surgery, with the diagnosis of CP  The various methods of treatment have been discussed with the patient and family. After consideration of risks, benefits and other options for treatment, the patient has consented to  Procedure(s) (LRB): JV LEFT HEART CATHETERIZATION WITH CORONARY ANGIOGRAM (N/A) as a surgical intervention .  The patient's history has been reviewed, patient examined, no change in status, stable for surgery.  I have reviewed the patient's chart and labs.  Questions were answered to the patient's satisfaction.     Elwyn Klosinski

## 2012-03-26 NOTE — CV Procedure (Signed)
   Cardiac Catheterization Operative Report  [name] [id number] 7/22/201312:30 PM VYAS,DHRUV B., MD  Procedure Performed:  1. Left Heart Catheterization 2. Selective Coronary Angiography 3. Left ventricular angiogram  Operator: Verne Carrow, MD  Indication:  Known CAD with DES placement in the proximal and mid LAD in June 2013. Recurrent chest pain.                                      Procedure Details: The risks, benefits, complications, treatment options, and expected outcomes were discussed with the patient. The patient and/or family concurred with the proposed plan, giving informed consent. The patient was brought to the cath lab after IV hydration was begun and oral premedication was given. The patient was further sedated with Versed and Fentanyl. The right groin was prepped and draped in the usual manner. Using the modified Seldinger access technique, a 4 French sheath was placed in the right femoral artery. Standard diagnostic catheters were used to perform selective coronary angiography. A pigtail catheter was used to perform a left ventricular angiogram.  There were no immediate complications. The patient was taken to the recovery area in stable condition.   Hemodynamic Findings: Central aortic pressure: 126/59 Left ventricular pressure: 127/9/20  Angiographic Findings:  Left main: No obstructive disease.   Left Anterior Descending Artery: Moderate sized vessel that courses to the apex. The ostium and proximal vessel has 30% stenosis. There is a stent present in the proximal to mid segment that is patent. The distal LAD is disease free. There are two very small caliber diagonal branches that are jailed by the stent and both have 90% ostial stenoses. This is unchanged from the last cath in 2007 and in June 2013.  Circumflex Artery: Moderate sized vessel with mild plaque in the first OM branch.   Right Coronary Artery: Moderate sized, dominant vessel with no  obstructive disease noted.   Left Ventricular Angiogram: LVEF 55%.   Impression: 1. Single vessel CAD with patent stented segment of the proximal and mid LAD 2. Severe ostial disease in both very small caliber diagonal branches secondary to being jailed by the stent. This could be causing angina however these vessels are very small and PCI is not an option 3. Normal LV systolic function  Recommendations: Will continue medical therapy.        Complications:  None. The patient tolerated the procedure well.

## 2012-03-26 NOTE — Progress Notes (Signed)
Bedrest begins @ 1245.  Dr. Clifton James in to discuss results with patient and family.

## 2012-03-27 ENCOUNTER — Ambulatory Visit: Payer: Managed Care, Other (non HMO) | Admitting: Cardiology

## 2012-04-03 ENCOUNTER — Other Ambulatory Visit: Payer: Self-pay | Admitting: Cardiology

## 2012-04-03 MED ORDER — METOPROLOL SUCCINATE ER 50 MG PO TB24: 50.0000 mg | ORAL_TABLET | Freq: Every day | ORAL | Status: DC

## 2012-04-03 NOTE — Telephone Encounter (Signed)
Please call in refill to [facility]. / tg

## 2012-04-06 ENCOUNTER — Telehealth: Payer: Self-pay | Admitting: Cardiology

## 2012-04-06 MED ORDER — METOPROLOL SUCCINATE ER 50 MG PO TB24: 50.0000 mg | ORAL_TABLET | Freq: Every day | ORAL | Status: DC

## 2012-04-06 NOTE — Telephone Encounter (Signed)
PT NEEDS METOPROLOL CALLED IN TO [facility], IT WAS CALLED IN TO [facility] ON 04/03/12.

## 2012-04-19 ENCOUNTER — Ambulatory Visit (INDEPENDENT_AMBULATORY_CARE_PROVIDER_SITE_OTHER): Payer: Managed Care, Other (non HMO) | Admitting: Cardiology

## 2012-04-19 ENCOUNTER — Encounter: Payer: Self-pay | Admitting: Cardiology

## 2012-04-19 VITALS — BP 124/66 | HR 67 | Ht 70.0 in | Wt 192.1 lb

## 2012-04-19 DIAGNOSIS — E785 Hyperlipidemia, unspecified: Secondary | ICD-10-CM

## 2012-04-19 DIAGNOSIS — I10 Essential (primary) hypertension: Secondary | ICD-10-CM

## 2012-04-19 MED ORDER — PRAVASTATIN SODIUM 10 MG PO TABS: 5.0000 mg | ORAL_TABLET | Freq: Every day | ORAL | Status: DC

## 2012-04-19 MED ORDER — ISOSORBIDE MONONITRATE ER 60 MG PO TB24: 60.0000 mg | ORAL_TABLET | Freq: Every day | ORAL | Status: DC

## 2012-04-19 MED ORDER — HYDROCHLOROTHIAZIDE 12.5 MG PO CAPS: 12.5000 mg | ORAL_CAPSULE | ORAL | Status: DC

## 2012-04-19 NOTE — Progress Notes (Signed)
Patient ID: [name], male   DOB: 11-26-1950, 61 y.o.   MRN: [id number] HPI: Scheduled return visit for this nice gentleman with coronary artery disease.  An abnormal stress test led to placement of a DES in the LAD a few months ago.  Patient subsequently developed progressive angina requiring repeat coronary angiography, which revealed a patent stent but critical proximal obstructive disease and 2 small jailed diagonals.  Only medical therapy is feasible.  [name] has improved substantially with the addition of a very low dose of isosorbide mononitrate.  He now can walk up gentle hills and as far as 70 yards without much in the way of chest discomfort.  The patient has had intolerance of statins, developing foot and leg pain with multiple agents.  He tolerated rosuvastatin at a dose of 10 mg for some time, but subsequently complained of recurrent symptoms prompting Dr. Clifton Hobbs to ask him to discontinue that medicine.  Since doing so, symptoms have resolved.  Prior to Admission medications   Medication Sig Start Date End Date Taking? Authorizing Provider  amLODipine (NORVASC) 5 MG tablet Take 1 tablet (5 mg total) by mouth daily. 03/21/12 03/21/13 Yes Juan Kief, PA  aspirin 81 MG tablet Take 81 mg by mouth daily.   Yes Historical Provider, MD  clopidogrel (PLAVIX) 75 MG tablet Take 1 tablet (75 mg total) by mouth daily with breakfast. 02/24/12 02/23/13 Yes Juan Gross, NP  isosorbide mononitrate (IMDUR) 60 MG 24 hr tablet Take 1 tablet (60 mg total) by mouth daily. 04/19/12  Yes Juan Brunswick, MD  lisinopril (PRINIVIL,ZESTRIL) 5 MG tablet Take 1 tablet (5 mg total) by mouth daily. 02/24/12 02/23/13 Yes Juan Gross, NP  metoprolol succinate (TOPROL-XL) 50 MG 24 hr tablet Take 1 tablet (50 mg total) by mouth daily. Take with or immediately following a meal. 04/06/12  Yes Juan Brunswick, MD  nitroGLYCERIN (NITROSTAT) 0.4 MG SL tablet Place 1 tablet (0.4 mg total) under the  tongue every 5 (five) minutes as needed for chest pain (up to 3 doses). 02/18/12 02/17/13 Yes Juan A Hope, PA-C  pantoprazole (PROTONIX) 40 MG tablet Take 1 tablet (40 mg total) by mouth daily. 02/24/12 02/23/13 Yes Juan Gross, NP  hydrochlorothiazide (MICROZIDE) 12.5 MG capsule Take 1 capsule (12.5 mg total) by mouth every other day. 04/19/12 04/19/13  Juan Brunswick, MD  pravastatin (PRAVACHOL) 10 MG tablet Take 0.5 tablets (5 mg total) by mouth daily. 04/19/12 04/19/13  Juan Brunswick, MD   Allergies  Allergen Reactions  . Crestor (Rosuvastatin) Other (See Comments)    Myalgias; also Vytorin--  PT CAN SOMEWHAT TOLERATE CRESTOR 10 mg  . Vytorin (Ezetimibe-Simvastatin) Other (See Comments)    Myalgias; also rosuvastatin      Past medical history, social history, and family history reviewed and updated.  ROS: Denies any complications related to recent outpatient catheterization from the right femoral artery; no claudication; mild recurrent pedal edema; all other systems reviewed and are negative.  PHYSICAL EXAM: BP 124/66  Pulse 67  Ht 5\' 10"  (1.778 m)  Wt 87.145 kg (192 lb 1.9 oz)  BMI 27.57 kg/m2  SpO2 96%  General-Well developed; no acute distress Body habitus-Mildly overweight Neck-No JVD; no carotid bruits Lungs-clear lung fields; resonant to percussion; modestly prolonged expiratory phase Cardiovascular-normal PMI; normal S1 and S2; S4 present Abdomen-normal bowel sounds; soft and non-tender without masses or organomegaly Musculoskeletal-No deformities, no cyanosis or clubbing Neurologic-Normal cranial nerves; symmetric strength and tone Skin-Warm,  no significant lesions Extremities-Decreased distal pulses-barely palpable left dorsalis pedis but bounding posterior tibial, trace right dorsalis pedis and absent posterior tibial verified by Doppler; Minimal edema  ASSESSMENT AND PLAN:  Juan Bing, MD 04/19/2012 12:00 PM

## 2012-04-19 NOTE — Assessment & Plan Note (Signed)
Treatment with a statin would be highly desirable.  Patient's symptoms likely represent statin intolerance, but claudication is possible.  I will start Pravachol at extremely low dose and gradually increase as tolerated.

## 2012-04-19 NOTE — Assessment & Plan Note (Signed)
Blood pressure control is excellent.  Patient developed orthostatic hypotension that was symptomatic when treated with daily diuretic, but would like something to decrease peripheral edema.  Hydrochlorothiazide will be started at a dose of 12.5 mg every other day.  Patient cautioned to report dizziness or syncope if they develop.

## 2012-04-19 NOTE — Progress Notes (Deleted)
Name: [name]    DOB: November 28, 1950  Age: 61 y.o.  MR#: [id number]       PCP:  Ignatius Specking., MD      Insurance: @PAYORNAME @   CC:   No chief complaint on file.   VS BP 124/66  Pulse 67  Ht 5\' 10"  (1.778 m)  Wt 192 lb 1.9 oz (87.145 kg)  BMI 27.57 kg/m2  SpO2 96%  Weights Current Weight  04/19/12 192 lb 1.9 oz (87.145 kg)  03/26/12 188 lb (85.276 kg)  03/26/12 188 lb (85.276 kg)    Blood Pressure  BP Readings from Last 3 Encounters:  04/19/12 124/66  03/26/12 140/76  03/26/12 140/76     Admit date:  (Not on file) Last encounter with RMR:  04/06/2012   Allergy Allergies  Allergen Reactions  . Crestor (Rosuvastatin) Other (See Comments)    Myalgias; also Vytorin--  PT CAN SOMEWHAT TOLERATE CRESTOR 10 mg  . Vytorin (Ezetimibe-Simvastatin) Other (See Comments)    Myalgias; also rosuvastatin     Current Outpatient Prescriptions  Medication Sig Dispense Refill  . amLODipine (NORVASC) 5 MG tablet Take 1 tablet (5 mg total) by mouth daily.  30 tablet  6  . aspirin 81 MG tablet Take 81 mg by mouth daily.      . clopidogrel (PLAVIX) 75 MG tablet Take 1 tablet (75 mg total) by mouth daily with breakfast.  90 tablet  3  . isosorbide mononitrate (IMDUR) 30 MG 24 hr tablet Take 15 mg by mouth daily.       Marland Kitchen lisinopril (PRINIVIL,ZESTRIL) 5 MG tablet Take 1 tablet (5 mg total) by mouth daily.  90 tablet  3  . metoprolol succinate (TOPROL-XL) 50 MG 24 hr tablet Take 1 tablet (50 mg total) by mouth daily. Take with or immediately following a meal.  90 tablet  3  . nitroGLYCERIN (NITROSTAT) 0.4 MG SL tablet Place 1 tablet (0.4 mg total) under the tongue every 5 (five) minutes as needed for chest pain (up to 3 doses).  25 tablet  3  . pantoprazole (PROTONIX) 40 MG tablet Take 1 tablet (40 mg total) by mouth daily.  90 tablet  3    Discontinued Meds:    Medications Discontinued During This Encounter  Medication Reason  . rosuvastatin (CRESTOR) 10 MG tablet Error  . Choline  Fenofibrate (TRILIPIX) 135 MG capsule Error    Patient Active Problem List  Diagnosis  . Arteriosclerotic cardiovascular disease (ASCVD)  . Hyperlipidemia  . Angina pectoris  . Hypertension    LABS Office Visit on 03/21/2012  Component Date Value  . Sodium 03/21/2012 141   . Potassium 03/21/2012 4.0   . Chloride 03/21/2012 105   . CO2 03/21/2012 25   . Glucose, Bld 03/21/2012 85   . BUN 03/21/2012 18   . Creat 03/21/2012 0.95   . Calcium 03/21/2012 9.8   . WBC 03/21/2012 3.9*  . RBC 03/21/2012 4.31   . Hemoglobin 03/21/2012 13.5   . HCT 03/21/2012 38.7*  . MCV 03/21/2012 89.8   . [facility] 03/21/2012 31.3   . MCHC 03/21/2012 34.9   . RDW 03/21/2012 13.5   . Platelets 03/21/2012 146*  . Neutrophils Relative 03/21/2012 68   . Neutro Abs 03/21/2012 2.7   . Lymphocytes Relative 03/21/2012 17   . Lymphs Abs 03/21/2012 0.7   . Monocytes Relative 03/21/2012 11   . Monocytes Absolute 03/21/2012 0.4   . Eosinophils Relative 03/21/2012 3   . Eosinophils Absolute  03/21/2012 0.1   . Basophils Relative 03/21/2012 1   . Basophils Absolute 03/21/2012 0.0   . Smear Review 03/21/2012 Criteria for review not met   . aPTT 03/21/2012 28   . Prothrombin Time 03/21/2012 13.9   . INR 03/21/2012 1.03   Orders Only on 03/06/2012  Component Date Value  . Cholesterol 03/06/2012 187   . Triglycerides 03/06/2012 364*  . HDL 03/06/2012 45   . Total CHOL/HDL Ratio 03/06/2012 4.2   . VLDL 03/06/2012 73*  . LDL Cholesterol 03/06/2012 69   Orders Only on 03/06/2012  Component Date Value  . Sodium 03/06/2012 141   . Potassium 03/06/2012 4.3   . Chloride 03/06/2012 105   . CO2 03/06/2012 27   . Glucose, Bld 03/06/2012 93   . BUN 03/06/2012 13   . Creat 03/06/2012 1.04   . Total Bilirubin 03/06/2012 0.6   . Alkaline Phosphatase 03/06/2012 31*  . AST 03/06/2012 21   . ALT 03/06/2012 22   . Total Protein 03/06/2012 6.8   . Albumin 03/06/2012 4.7   . Calcium 03/06/2012 9.8   Admission on  02/17/2012, Discharged on 02/18/2012  Component Date Value  . WBC 02/18/2012 4.3   . RBC 02/18/2012 4.41   . Hemoglobin 02/18/2012 14.0   . HCT 02/18/2012 39.9   . MCV 02/18/2012 90.5   . [facility] 02/18/2012 31.7   . MCHC 02/18/2012 35.1   . RDW 02/18/2012 12.6   . Platelets 02/18/2012 124*  . Sodium 02/18/2012 140   . Potassium 02/18/2012 3.9   . Chloride 02/18/2012 102   . CO2 02/18/2012 28   . Glucose, Bld 02/18/2012 107*  . BUN 02/18/2012 15   . Creatinine, Ser 02/18/2012 1.00   . Calcium 02/18/2012 9.6   . GFR calc non Af Amer 02/18/2012 80*  . GFR calc Af Amer 02/18/2012 >90   . Activated Clotting Time 02/17/2012 574   Orders Only on 02/14/2012  Component Date Value  . Sodium 02/14/2012 137   . Potassium 02/14/2012 4.3   . Chloride 02/14/2012 100   . CO2 02/14/2012 25   . Glucose, Bld 02/14/2012 90   . BUN 02/14/2012 24*  . Creat 02/14/2012 0.91   . Calcium 02/14/2012 10.2   . Prothrombin Time 02/14/2012 12.6   . INR 02/14/2012 0.91   . aPTT 02/14/2012 28   . WBC 02/14/2012 4.4   . RBC 02/14/2012 4.66   . Hemoglobin 02/14/2012 14.7   . HCT 02/14/2012 42.6   . MCV 02/14/2012 91.4   . [facility] 02/14/2012 31.5   . MCHC 02/14/2012 34.5   . RDW 02/14/2012 13.1   . Platelets 02/14/2012 141*     Results for this Opt Visit:     Results for orders placed in visit on 03/21/12  BASIC METABOLIC PANEL      Component Value Range   Sodium 141  135 - 145 mEq/L   Potassium 4.0  3.5 - 5.3 mEq/L   Chloride 105  96 - 112 mEq/L   CO2 25  19 - 32 mEq/L   Glucose, Bld 85  70 - 99 mg/dL   BUN 18  6 - 23 mg/dL   Creat 9.56  2.13 - 0.86 mg/dL   Calcium 9.8  8.4 - 57.8 mg/dL  CBC WITH DIFFERENTIAL      Component Value Range   WBC 3.9 (*) 4.0 - 10.5 K/uL   RBC 4.31  4.22 - 5.81 MIL/uL   Hemoglobin 13.5  13.0 -  17.0 g/dL   HCT 45.4 (*) 09.8 - 11.9 %   MCV 89.8  78.0 - 100.0 fL   MCH 31.3  26.0 - 34.0 pg   MCHC 34.9  30.0 - 36.0 g/dL   RDW 14.7  82.9 - 56.2 %   Platelets 146 (*)  150 - 400 K/uL   Neutrophils Relative 68  43 - 77 %   Neutro Abs 2.7  1.7 - 7.7 K/uL   Lymphocytes Relative 17  12 - 46 %   Lymphs Abs 0.7  0.7 - 4.0 K/uL   Monocytes Relative 11  3 - 12 %   Monocytes Absolute 0.4  0.1 - 1.0 K/uL   Eosinophils Relative 3  0 - 5 %   Eosinophils Absolute 0.1  0.0 - 0.7 K/uL   Basophils Relative 1  0 - 1 %   Basophils Absolute 0.0  0.0 - 0.1 K/uL   Smear Review Criteria for review not met    APTT      Component Value Range   aPTT 28  24 - 37 seconds  PROTIME-INR      Component Value Range   Prothrombin Time 13.9  11.6 - 15.2 seconds   INR 1.03  <1.50    EKG Orders placed in visit on 03/21/12  . EKG 12-LEAD     Prior Assessment and Plan Problem List as of 04/19/2012            Cardiology Problems   Arteriosclerotic cardiovascular disease (ASCVD)   Last Assessment & Plan Note   03/21/2012 Office Visit Signed 03/21/2012 12:52 PM by Dyann Kief, PA    Recent drug-eluting stent to the LAD for in-stent restenosis with residual jailed diagonals. Please see above for details.    Hyperlipidemia   Last Assessment & Plan Note   02/10/2012 Office Visit Signed 02/10/2012  9:15 PM by Kathlen Brunswick, MD    Most recent lipid profile available to me from 6 months ago was suboptimal with moderate elevation of triglycerides, elevated total cholesterol and minimally elevated LDL.  A profile will be repeated and medical therapy adjusted.    Angina pectoris   Last Assessment & Plan Note   03/21/2012 Office Visit Signed 03/21/2012 12:51 PM by Dyann Kief, PA    Patient has had worsening angina daily since his most recent stent on 02/17/12. I've discussed this patient with Dr. Dietrich Pates. He recommends repeat cardiac catheterization, to rule out in-stent restenosis. We will also increase his amlodipine 5 mg daily. If he significantly improves on a higher dose amlodipine he will follow up with Dr. Dietrich Pates next week and forgo the cardiac catheterization.     Hypertension   Last Assessment & Plan Note   03/21/2012 Office Visit Signed 03/21/2012 12:51 PM by Dyann Kief, PA    stable        Imaging: No results found.   FRS Calculation: Score not calculated. Missing: Total Cholesterol

## 2012-04-19 NOTE — Assessment & Plan Note (Signed)
Coronary disease is stable with a good result from stent placement.  Residual disease is low risk, but apparently causing minor myocardial ischemia, although it is possible his symptoms are not of cardiac origin.  Anti-anginal medication will be increased, initially starting with Isosorbide mononitrate.

## 2012-04-19 NOTE — Patient Instructions (Addendum)
Your physician recommends that you schedule a follow-up appointment in:  8 weeks  Your physician has recommended you make the following change in your medication:  1 - INCREASE Imdur (take 1/2 of 60 mg tablet for 2 weeks) then increase to 1 60 mg tablet each day thereafter      STOP IMDUR 48 HOURS PRIOR TO USING VIAGRA AND DO NOT TAKE FOR 48 HOURS AFTER 2 - START HCTZ 12.5 mg every other day 3 - DO NOT STOP Plavix without the instruction of Cardiologist 4 - START Pravachol 5 mg daily for your cholesterol  Addendum:  2 week supply of

## 2012-04-27 ENCOUNTER — Encounter: Payer: Self-pay | Admitting: Cardiology

## 2012-04-27 ENCOUNTER — Other Ambulatory Visit: Payer: Self-pay | Admitting: *Deleted

## 2012-04-27 DIAGNOSIS — M545 Low back pain, unspecified: Secondary | ICD-10-CM

## 2012-04-27 HISTORY — DX: Low back pain, unspecified: M54.50

## 2012-04-27 MED ORDER — ISOSORBIDE MONONITRATE ER 60 MG PO TB24: 60.0000 mg | ORAL_TABLET | Freq: Every day | ORAL | Status: DC

## 2012-04-30 ENCOUNTER — Other Ambulatory Visit: Payer: Self-pay | Admitting: Cardiology

## 2012-05-02 MED ORDER — AMLODIPINE BESYLATE 5 MG PO TABS: 5.0000 mg | ORAL_TABLET | Freq: Every day | ORAL | Status: DC

## 2012-06-14 ENCOUNTER — Ambulatory Visit: Payer: Managed Care, Other (non HMO) | Admitting: Cardiology

## 2012-06-15 ENCOUNTER — Ambulatory Visit (INDEPENDENT_AMBULATORY_CARE_PROVIDER_SITE_OTHER): Payer: Managed Care, Other (non HMO) | Admitting: Cardiology

## 2012-06-15 ENCOUNTER — Encounter: Payer: Self-pay | Admitting: Cardiology

## 2012-06-15 ENCOUNTER — Encounter: Payer: Self-pay | Admitting: *Deleted

## 2012-06-15 VITALS — BP 130/72 | HR 63 | Ht 70.0 in | Wt 198.0 lb

## 2012-06-15 DIAGNOSIS — I2510 Atherosclerotic heart disease of native coronary artery without angina pectoris: Secondary | ICD-10-CM

## 2012-06-15 DIAGNOSIS — E872 Acidosis, unspecified: Secondary | ICD-10-CM

## 2012-06-15 DIAGNOSIS — E785 Hyperlipidemia, unspecified: Secondary | ICD-10-CM

## 2012-06-15 DIAGNOSIS — M545 Low back pain, unspecified: Secondary | ICD-10-CM

## 2012-06-15 DIAGNOSIS — I10 Essential (primary) hypertension: Secondary | ICD-10-CM

## 2012-06-15 DIAGNOSIS — I7090 Unspecified atherosclerosis: Secondary | ICD-10-CM

## 2012-06-15 DIAGNOSIS — E782 Mixed hyperlipidemia: Secondary | ICD-10-CM

## 2012-06-15 MED ORDER — PRAVASTATIN SODIUM 20 MG PO TABS: 20.0000 mg | ORAL_TABLET | Freq: Every day | ORAL | Status: DC

## 2012-06-15 NOTE — Patient Instructions (Addendum)
Your physician recommends that you schedule a follow-up appointment in: 4 months  Your physician has recommended you make the following change in your medication:  1 - STOP Imdur 2 - INCREASE Pravastatin to 20 mg daily  Your physician recommends that you return for lab work in: 1 month

## 2012-06-15 NOTE — Progress Notes (Signed)
Patient ID: [name], male   DOB: Jan 16, 1951, 61 y.o.   MRN: [id number]  HPI: Scheduled return visit for continued assessment and treatment of coronary artery disease.  Since his last visit, he has done fairly well.  He is working full-time, but in a less physically demanding job, which was arranged for him based upon his back problems not his coronary artery disease.  He has received epidural injections in the past, but these are on hold as the result of his need to take clopidogrel.  Blood pressure control has been good.  He notes occasional chest discomfort that responds to nitroglycerin.  This is often, but not always exertional in nature.  Isosorbide mononitrate resulted in headache and malaise prompting him to discontinue use.  Prior to Admission medications   Medication Sig Start Date End Date Taking? Authorizing Provider  amLODipine (NORVASC) 5 MG tablet Take 1 tablet (5 mg total) by mouth daily. 04/30/12 04/30/13 Yes Kathlen Brunswick, MD  aspirin 81 MG tablet Take 81 mg by mouth daily.   Yes Historical Provider, MD  clopidogrel (PLAVIX) 75 MG tablet Take 1 tablet (75 mg total) by mouth daily with breakfast. 02/24/12 02/23/13 Yes Jodelle Gross, NP  hydrochlorothiazide (MICROZIDE) 12.5 MG capsule Take 1 capsule (12.5 mg total) by mouth every other day. 04/19/12 04/19/13 Yes Kathlen Brunswick, MD  lisinopril (PRINIVIL,ZESTRIL) 5 MG tablet Take 1 tablet (5 mg total) by mouth daily. 02/24/12 02/23/13 Yes Jodelle Gross, NP  metoprolol succinate (TOPROL-XL) 50 MG 24 hr tablet Take 1 tablet (50 mg total) by mouth daily. Take with or immediately following a meal. 04/06/12  Yes Kathlen Brunswick, MD  nitroGLYCERIN (NITROSTAT) 0.4 MG SL tablet Place 1 tablet (0.4 mg total) under the tongue every 5 (five) minutes as needed for chest pain (up to 3 doses). 02/18/12 02/17/13 Yes Jessica A Hope, PA-C  pantoprazole (PROTONIX) 40 MG tablet Take 1 tablet (40 mg total) by mouth daily. 02/24/12 02/23/13 Yes  Jodelle Gross, NP  pravastatin (PRAVACHOL) 10 MG tablet Take 0.5 tablets (5 mg total) by mouth daily. 04/19/12 04/19/13 Yes Kathlen Brunswick, MD  Tapentadol HCl (NUCYNTA PO) Take 75 mg by mouth as needed.   Yes Historical Provider, MD  isosorbide mononitrate (IMDUR) 60 MG 24 hr tablet Take 1 tablet (60 mg total) by mouth daily. 04/27/12   Kathlen Brunswick, MD   Allergies  Allergen Reactions  . Crestor (Rosuvastatin) Other (See Comments)    Myalgias; also Vytorin--  PT CAN SOMEWHAT TOLERATE CRESTOR 10 mg  . Isosorbide Mononitrate (Isosorbide) Other (See Comments)    Unable to tolerate Imdur at a dose of 60 mg secondary to malaise and headache  . Vytorin (Ezetimibe-Simvastatin) Other (See Comments)    Myalgias; also rosuvastatin   Past medical history, social history, and family history reviewed and updated.  ROS: Denies orthopnea, PND, exertional dyspnea, lightheadedness or syncope.  Chronic back pain is tolerable-he is using a new analgesic.  He has experienced no claudication or peripheral edema.  PHYSICAL EXAM: BP 130/72  Pulse 63  Ht 5\' 10"  (1.778 m)  Wt 89.812 kg (198 lb)  BMI 28.41 kg/m2  SpO2 98%  General-Well developed; no acute distress Body habitus-proportionate weight and height Neck-No JVD; no carotid bruits Lungs-clear lung fields; resonant to percussion Cardiovascular-normal PMI; Split S1 and normal S2 Abdomen-normal bowel sounds; soft and non-tender without masses or organomegaly Musculoskeletal-No deformities, no cyanosis or clubbing Neurologic-Normal cranial nerves; symmetric strength and tone Skin-Warm,  no significant lesions Extremities-distal pulses intact-normal posterior tibials, somewhat reduced dorsalis pedis bilaterally; no edema  ASSESSMENT AND PLAN:  Endicott Bing, MD 06/15/2012 3:51 PM

## 2012-06-15 NOTE — Assessment & Plan Note (Addendum)
Patient is tolerating pravastatin at very low dose.  We will increase this to 20 mg per day and check a lipid profile in one month.

## 2012-06-15 NOTE — Assessment & Plan Note (Signed)
Patient has done fairly well without epidural steroid injections.  If discomfort increases and is not well-controlled, we can consider early termination of clopidogrel.

## 2012-06-15 NOTE — Assessment & Plan Note (Signed)
Chest discomfort consistent with but not diagnostic for myocardial ischemia.  For now, chest pain syndrome is stable and tolerable.  If it progresses, ranolazine would be the next and last antianginal agent available.

## 2012-06-15 NOTE — Progress Notes (Deleted)
Name: [name]    DOB: 06-22-1951  Age: 61 y.o.  MR#: [id number]       PCP:  Ignatius Specking., MD      Insurance: @PAYORNAME @   MEDICATION BOTTLES REVIEWED  CC:    Chief Complaint  Patient presents with  . Appointment    pt states he is not taking his Imdur due to it making him feel sick    VS BP 130/72  Pulse 63  Ht 5\' 10"  (1.778 m)  Wt 198 lb (89.812 kg)  BMI 28.41 kg/m2  SpO2 98%  Weights Current Weight  06/15/12 198 lb (89.812 kg)  04/19/12 192 lb 1.9 oz (87.145 kg)  03/26/12 188 lb (85.276 kg)    Blood Pressure  BP Readings from Last 3 Encounters:  06/15/12 130/72  04/19/12 124/66  03/26/12 140/76     Admit date:  (Not on file) Last encounter with [facility]:  04/30/2012   Allergy Allergies  Allergen Reactions  . Crestor (Rosuvastatin) Other (See Comments)    Myalgias; also Vytorin--  PT CAN SOMEWHAT TOLERATE CRESTOR 10 mg  . Vytorin (Ezetimibe-Simvastatin) Other (See Comments)    Myalgias; also rosuvastatin     Current Outpatient Prescriptions  Medication Sig Dispense Refill  . amLODipine (NORVASC) 5 MG tablet Take 1 tablet (5 mg total) by mouth daily.  30 tablet  6  . aspirin 81 MG tablet Take 81 mg by mouth daily.      . clopidogrel (PLAVIX) 75 MG tablet Take 1 tablet (75 mg total) by mouth daily with breakfast.  90 tablet  3  . hydrochlorothiazide (MICROZIDE) 12.5 MG capsule Take 1 capsule (12.5 mg total) by mouth every other day.  30 capsule  6  . lisinopril (PRINIVIL,ZESTRIL) 5 MG tablet Take 1 tablet (5 mg total) by mouth daily.  90 tablet  3  . metoprolol succinate (TOPROL-XL) 50 MG 24 hr tablet Take 1 tablet (50 mg total) by mouth daily. Take with or immediately following a meal.  90 tablet  3  . nitroGLYCERIN (NITROSTAT) 0.4 MG SL tablet Place 1 tablet (0.4 mg total) under the tongue every 5 (five) minutes as needed for chest pain (up to 3 doses).  25 tablet  3  . pantoprazole (PROTONIX) 40 MG tablet Take 1 tablet (40 mg total) by mouth daily.  90  tablet  3  . pravastatin (PRAVACHOL) 10 MG tablet Take 0.5 tablets (5 mg total) by mouth daily.  15 tablet  11  . Tapentadol HCl (NUCYNTA PO) Take 75 mg by mouth as needed.      . isosorbide mononitrate (IMDUR) 60 MG 24 hr tablet Take 1 tablet (60 mg total) by mouth daily.  90 tablet  2    Discontinued Meds:   There are no discontinued medications.  Patient Active Problem List  Diagnosis  . Arteriosclerotic cardiovascular disease (ASCVD)  . Hyperlipidemia  . Hypertension  . Low back pain    LABS Office Visit on 03/21/2012  Component Date Value  . Sodium 03/21/2012 141   . Potassium 03/21/2012 4.0   . Chloride 03/21/2012 105   . CO2 03/21/2012 25   . Glucose, Bld 03/21/2012 85   . BUN 03/21/2012 18   . Creat 03/21/2012 0.95   . Calcium 03/21/2012 9.8   . WBC 03/21/2012 3.9*  . RBC 03/21/2012 4.31   . Hemoglobin 03/21/2012 13.5   . HCT 03/21/2012 38.7*  . MCV 03/21/2012 89.8   . [facility] 03/21/2012 31.3   .  MCHC 03/21/2012 34.9   . RDW 03/21/2012 13.5   . Platelets 03/21/2012 146*  . Neutrophils Relative 03/21/2012 68   . Neutro Abs 03/21/2012 2.7   . Lymphocytes Relative 03/21/2012 17   . Lymphs Abs 03/21/2012 0.7   . Monocytes Relative 03/21/2012 11   . Monocytes Absolute 03/21/2012 0.4   . Eosinophils Relative 03/21/2012 3   . Eosinophils Absolute 03/21/2012 0.1   . Basophils Relative 03/21/2012 1   . Basophils Absolute 03/21/2012 0.0   . Smear Review 03/21/2012 Criteria for review not met   . aPTT 03/21/2012 28   . Prothrombin Time 03/21/2012 13.9   . INR 03/21/2012 1.03      Results for this Opt Visit:     Results for orders placed in visit on 03/21/12  BASIC METABOLIC PANEL      Component Value Range   Sodium 141  135 - 145 mEq/L   Potassium 4.0  3.5 - 5.3 mEq/L   Chloride 105  96 - 112 mEq/L   CO2 25  19 - 32 mEq/L   Glucose, Bld 85  70 - 99 mg/dL   BUN 18  6 - 23 mg/dL   Creat 1.61  0.96 - 0.45 mg/dL   Calcium 9.8  8.4 - 40.9 mg/dL  CBC WITH  DIFFERENTIAL      Component Value Range   WBC 3.9 (*) 4.0 - 10.5 K/uL   RBC 4.31  4.22 - 5.81 MIL/uL   Hemoglobin 13.5  13.0 - 17.0 g/dL   HCT 81.1 (*) 91.4 - 78.2 %   MCV 89.8  78.0 - 100.0 fL   MCH 31.3  26.0 - 34.0 pg   MCHC 34.9  30.0 - 36.0 g/dL   RDW 95.6  21.3 - 08.6 %   Platelets 146 (*) 150 - 400 K/uL   Neutrophils Relative 68  43 - 77 %   Neutro Abs 2.7  1.7 - 7.7 K/uL   Lymphocytes Relative 17  12 - 46 %   Lymphs Abs 0.7  0.7 - 4.0 K/uL   Monocytes Relative 11  3 - 12 %   Monocytes Absolute 0.4  0.1 - 1.0 K/uL   Eosinophils Relative 3  0 - 5 %   Eosinophils Absolute 0.1  0.0 - 0.7 K/uL   Basophils Relative 1  0 - 1 %   Basophils Absolute 0.0  0.0 - 0.1 K/uL   Smear Review Criteria for review not met    APTT      Component Value Range   aPTT 28  24 - 37 seconds  PROTIME-INR      Component Value Range   Prothrombin Time 13.9  11.6 - 15.2 seconds   INR 1.03  <1.50    EKG Orders placed in visit on 03/21/12  . EKG 12-LEAD     Prior Assessment and Plan Problem List as of 06/15/2012            Cardiology Problems   Arteriosclerotic cardiovascular disease (ASCVD)   Last Assessment & Plan Note   04/19/2012 Office Visit Signed 04/19/2012 12:10 PM by Kathlen Brunswick, MD    Coronary disease is stable with a good result from stent placement.  Residual disease is low risk, but apparently causing minor myocardial ischemia, although it is possible his symptoms are not of cardiac origin.  Anti-anginal medication will be increased, initially starting with Isosorbide mononitrate.    Hyperlipidemia   Last Assessment & Plan Note   04/19/2012  Office Visit Signed 04/19/2012 12:09 PM by Kathlen Brunswick, MD    Treatment with a statin would be highly desirable.  Patient's symptoms likely represent statin intolerance, but claudication is possible.  I will start Pravachol at extremely low dose and gradually increase as tolerated.    Hypertension   Last Assessment & Plan Note    04/19/2012 Office Visit Signed 04/19/2012 12:08 PM by Kathlen Brunswick, MD    Blood pressure control is excellent.  Patient developed orthostatic hypotension that was symptomatic when treated with daily diuretic, but would like something to decrease peripheral edema.  Hydrochlorothiazide will be started at a dose of 12.5 mg every other day.  Patient cautioned to report dizziness or syncope if they develop.      Other   Low back pain       Imaging: No results found.   FRS Calculation: Score not calculated. Missing: Total Cholesterol

## 2012-06-15 NOTE — Assessment & Plan Note (Signed)
Blood pressure control is good with current medication, which will be continued. 

## 2012-06-21 ENCOUNTER — Other Ambulatory Visit: Payer: Self-pay | Admitting: Cardiology

## 2012-06-21 ENCOUNTER — Other Ambulatory Visit: Payer: Self-pay | Admitting: *Deleted

## 2012-06-21 MED ORDER — HYDROCHLOROTHIAZIDE 12.5 MG PO CAPS: 12.5000 mg | ORAL_CAPSULE | ORAL | Status: DC

## 2012-06-21 MED ORDER — AMLODIPINE BESYLATE 5 MG PO TABS: 5.0000 mg | ORAL_TABLET | Freq: Every day | ORAL | Status: DC

## 2012-06-21 MED ORDER — PRAVASTATIN SODIUM 20 MG PO TABS: 20.0000 mg | ORAL_TABLET | Freq: Every day | ORAL | Status: DC

## 2012-06-21 NOTE — Telephone Encounter (Signed)
Please send 90 day supply to [facility]. / tg

## 2012-06-28 ENCOUNTER — Telehealth: Payer: Self-pay | Admitting: *Deleted

## 2012-06-28 ENCOUNTER — Telehealth: Payer: Self-pay | Admitting: Cardiology

## 2012-06-28 MED ORDER — AMLODIPINE BESYLATE 5 MG PO TABS: 5.0000 mg | ORAL_TABLET | Freq: Every day | ORAL | Status: DC

## 2012-06-28 MED ORDER — PRAVASTATIN SODIUM 20 MG PO TABS: 20.0000 mg | ORAL_TABLET | Freq: Every day | ORAL | Status: DC

## 2012-06-28 MED ORDER — HYDROCHLOROTHIAZIDE 12.5 MG PO CAPS: 12.5000 mg | ORAL_CAPSULE | ORAL | Status: DC

## 2012-06-28 NOTE — Telephone Encounter (Signed)
Patient wife called.  [facility] and should be a 90 day supply when ordered.  Patient last script of Amlodipine adn Pravastatin were not correct and will need to be reordered.  HCTZ looks like should be qty 45.  Please check all medications that the correct quantity is listed.  Please order medications needed.

## 2012-06-28 NOTE — Telephone Encounter (Signed)
Pt wife is calling wanting to speak with a nurse about pt medications

## 2012-07-06 ENCOUNTER — Other Ambulatory Visit: Payer: Self-pay | Admitting: Cardiology

## 2012-07-06 NOTE — Telephone Encounter (Signed)
Patient states that his legs have been hurting since he raised his dose of Pravastatin. / tg

## 2012-07-06 NOTE — Telephone Encounter (Signed)
Please advise 

## 2012-07-06 NOTE — Telephone Encounter (Signed)
Resume previous dose of pravastatin.

## 2012-07-09 MED ORDER — PRAVASTATIN SODIUM 20 MG PO TABS: 10.0000 mg | ORAL_TABLET | Freq: Every day | ORAL | Status: DC

## 2012-07-09 NOTE — Addendum Note (Signed)
Addended by: Reather Laurence A on: 07/09/2012 04:19 PM   Modules accepted: Orders

## 2012-07-09 NOTE — Telephone Encounter (Signed)
Patient notified of recommendations and will resume 10 mg daily and call us if he continues to have pain.

## 2012-07-09 NOTE — Addendum Note (Signed)
Addended by: Reather Laurence A on: 07/09/2012 04:18 PM   Modules accepted: Orders

## 2012-07-13 ENCOUNTER — Other Ambulatory Visit: Payer: Self-pay | Admitting: *Deleted

## 2012-07-13 DIAGNOSIS — E782 Mixed hyperlipidemia: Secondary | ICD-10-CM

## 2012-07-20 ENCOUNTER — Encounter: Payer: Self-pay | Admitting: *Deleted

## 2012-07-27 ENCOUNTER — Telehealth: Payer: Self-pay | Admitting: Cardiology

## 2012-07-27 NOTE — Telephone Encounter (Signed)
Pt states he stopped taking medication, does he still need to get labs done?

## 2012-07-31 NOTE — Telephone Encounter (Signed)
Patient has stopped taking his pravastatin, per preference.  Advised him that there was no need to repeat lipid profile if he was not compliant with his regimen.

## 2012-08-15 ENCOUNTER — Telehealth: Payer: Self-pay | Admitting: Cardiology

## 2012-08-15 MED ORDER — AMLODIPINE BESYLATE 5 MG PO TABS: 5.0000 mg | ORAL_TABLET | Freq: Every day | ORAL | Status: DC

## 2012-08-15 NOTE — Telephone Encounter (Signed)
PT HAS QUESTIONS ABOUT A LAB ORDER AND A REFILL THAT NEEDS A 90 DAY SUPPLY. SHE DOESN'T WANT TO LEAVE INFO WITH ME SHE WOULD LIKE NURSE TO CALL HER.

## 2012-08-15 NOTE — Telephone Encounter (Signed)
Returned pt call, he advised his wife wanted the labs to be faxed to the [place] [facility] per he lives there and it is closer, noted the phone notation as follows:  Cathren Harsh, RN 07/31/2012 11:22 AM Signed  Patient has stopped taking his pravastatin, per preference. Advised him that there was no need to repeat lipid profile if he was not compliant with his regimen. San Jetty 07/27/2012 3:30 PM Signed  Pt states he stopped taking medication, does he still need to get labs done?  Pt advised he received a letter about his labs that are NON fasting around 07-20-12, pt thought per NON-Fasting this must not be due to cholesterol Advised that Tomi is not in the office today however we will review tomorrow to clarify which labs to be done per pt stated " I do not understand why the doctor does not want to know what my cholesterol numbers are just because I stopped taking the medication?"   Pt requested a refill for Amlodipine [facility], faxed refill #90 with 1 refill via escribe for amlodipine

## 2012-08-22 ENCOUNTER — Telehealth: Payer: Self-pay | Admitting: Cardiology

## 2012-08-22 NOTE — Telephone Encounter (Signed)
Please advise ASAP.  Was seen in October.

## 2012-08-22 NOTE — Telephone Encounter (Signed)
Patient is now not scheduled until January 6 th for this testing. 1- Elder Cyphers at [facility] will be performing the testing, per request of Dr Juanetta Gosling at [facility], due to need for evaluation of future work restrictions. 2 - Testing involves evaluation of patient's level of functioning as it relates to pushing, pulling, lifting, ROM, and dexterity.

## 2012-08-22 NOTE — Telephone Encounter (Signed)
Patient is having Functional Capacity Evaluation and needs cardiac clearance to do this test.  Test is scheduled for tomorrow.  Please contact Kendal Hymen with clearance or to let her know if he will have to be rescheduled. / tgs

## 2012-08-23 NOTE — Telephone Encounter (Signed)
Will fax recommendations to [facility]

## 2012-08-23 NOTE — Telephone Encounter (Signed)
[name] may proceed with the testing as described.  He should have nitroglycerin available to use as needed.  He should be afforded the opportunity to rest should he develop shortness of breath or chest discomfort until symptoms pass.

## 2012-09-14 ENCOUNTER — Telehealth: Payer: Self-pay | Admitting: Cardiology

## 2012-09-14 ENCOUNTER — Encounter: Payer: Self-pay | Admitting: Cardiology

## 2012-09-14 ENCOUNTER — Encounter: Payer: Self-pay | Admitting: *Deleted

## 2012-09-14 DIAGNOSIS — E782 Mixed hyperlipidemia: Secondary | ICD-10-CM

## 2012-09-14 NOTE — Telephone Encounter (Signed)
Laboratory results received from [facility].  A lipid profile revealed total cholesterol 303, HDL 34, triglycerides of 1481.  LDL could not be calculated.  Determine patient's current dose of pravastatin.  If he is tolerating 20 mg per day, increase to 40 mg per day. Fenofibrate 145 mg per day Niaspan 500 mg per day; titrate to 1500 mg per day. Lipid profile and direct LDL in one month.

## 2012-09-17 ENCOUNTER — Other Ambulatory Visit: Payer: Self-pay | Admitting: *Deleted

## 2012-09-17 ENCOUNTER — Encounter: Payer: Self-pay | Admitting: *Deleted

## 2012-09-17 DIAGNOSIS — E782 Mixed hyperlipidemia: Secondary | ICD-10-CM

## 2012-09-17 MED ORDER — NIACIN ER (ANTIHYPERLIPIDEMIC) 500 MG PO TBCR: 500.0000 mg | EXTENDED_RELEASE_TABLET | Freq: Every day | ORAL | Status: DC

## 2012-09-17 MED ORDER — NIACIN ER (ANTIHYPERLIPIDEMIC) 1000 MG PO TBCR: 1000.0000 mg | EXTENDED_RELEASE_TABLET | Freq: Every day | ORAL | Status: DC

## 2012-09-17 MED ORDER — FENOFIBRATE 145 MG PO TABS: 145.0000 mg | ORAL_TABLET | Freq: Every day | ORAL | Status: DC

## 2012-09-17 MED ORDER — PRAVASTATIN SODIUM 20 MG PO TABS: 20.0000 mg | ORAL_TABLET | Freq: Every evening | ORAL | Status: DC

## 2012-09-17 NOTE — Addendum Note (Signed)
Addended by: Reather Laurence A on: 09/17/2012 04:39 PM   Modules accepted: Orders

## 2012-09-17 NOTE — Telephone Encounter (Signed)
Attempted to contact patient with recommendations.  Msg left for a return call.

## 2012-09-17 NOTE — Telephone Encounter (Signed)
Spoke to patient, who has not been taking pravastatin for quite some time, however has been started on Lyrica and has had no leg pain since.  Advised him to start back on 20 mg and follow the recommendations below.  Will come by the office to obtain samples of Niaspan.  Verbalized understanding.

## 2012-10-12 ENCOUNTER — Other Ambulatory Visit: Payer: Self-pay | Admitting: Cardiology

## 2012-10-12 DIAGNOSIS — E782 Mixed hyperlipidemia: Secondary | ICD-10-CM

## 2012-10-12 LAB — LIPID PANEL
Cholesterol: 244 mg/dL — ABNORMAL HIGH (ref 0–200)
Triglycerides: 565 mg/dL — ABNORMAL HIGH (ref <150)

## 2012-10-15 ENCOUNTER — Encounter: Payer: Self-pay | Admitting: Cardiology

## 2012-10-17 ENCOUNTER — Telehealth: Payer: Self-pay | Admitting: Cardiology

## 2012-10-17 MED ORDER — FENOFIBRATE 145 MG PO TABS: 145.0000 mg | ORAL_TABLET | Freq: Every day | ORAL | Status: DC

## 2012-10-17 NOTE — Telephone Encounter (Signed)
Done

## 2012-10-17 NOTE — Telephone Encounter (Signed)
Patient needs refill on Tricor sent to [facility] for 90 day supply/ tgs

## 2012-10-19 ENCOUNTER — Ambulatory Visit: Payer: Managed Care, Other (non HMO) | Admitting: Cardiology

## 2012-12-03 ENCOUNTER — Other Ambulatory Visit: Payer: Self-pay | Admitting: *Deleted

## 2012-12-03 ENCOUNTER — Encounter: Payer: Self-pay | Admitting: *Deleted

## 2012-12-03 ENCOUNTER — Telehealth: Payer: Self-pay | Admitting: *Deleted

## 2012-12-03 DIAGNOSIS — E782 Mixed hyperlipidemia: Secondary | ICD-10-CM

## 2012-12-03 NOTE — Telephone Encounter (Signed)
Received a telephone call from patient stating that he would like to start on Niaspan that was previously prescribed in January.   States he will come to office and obtain samples of Niaspan, Lipid profile in 1 month and follow up with Dr Dietrich Pates on May 1st.

## 2012-12-04 ENCOUNTER — Ambulatory Visit: Payer: Managed Care, Other (non HMO) | Admitting: Cardiology

## 2013-01-03 ENCOUNTER — Ambulatory Visit: Payer: Managed Care, Other (non HMO) | Admitting: Cardiology

## 2013-01-03 ENCOUNTER — Other Ambulatory Visit: Payer: Self-pay | Admitting: Cardiology

## 2013-01-03 DIAGNOSIS — E782 Mixed hyperlipidemia: Secondary | ICD-10-CM

## 2013-01-04 LAB — LIPID PANEL
HDL: 49 mg/dL (ref >39)
LDL Cholesterol: 98 mg/dL (ref 0–99)
Total CHOL/HDL Ratio: 4.4 Ratio

## 2013-01-04 LAB — LDL CHOLESTEROL, DIRECT: Direct LDL: 120 mg/dL — ABNORMAL HIGH

## 2013-01-05 ENCOUNTER — Encounter: Payer: Self-pay | Admitting: Cardiology

## 2013-01-07 MED ORDER — PRAVASTATIN SODIUM 40 MG PO TABS: 40.0000 mg | ORAL_TABLET | Freq: Every evening | ORAL | Status: DC

## 2013-01-07 NOTE — Addendum Note (Signed)
Addended by: Derry Lory A on: 01/07/2013 03:40 PM   Modules accepted: Orders

## 2013-01-07 NOTE — Progress Notes (Signed)
Yes, it would be desirable to increase Niaspan to a total of 1500 mg.  [place] Bing, MD

## 2013-01-08 NOTE — Progress Notes (Signed)
Spoke to pt to advise results/instructions. Pt understood. Samples up front for pick up

## 2013-01-08 NOTE — Progress Notes (Signed)
.  left message to have patient return my call.  

## 2013-01-13 ENCOUNTER — Other Ambulatory Visit: Payer: Self-pay | Admitting: Cardiology

## 2013-01-17 ENCOUNTER — Telehealth: Payer: Self-pay | Admitting: *Deleted

## 2013-01-17 NOTE — Telephone Encounter (Signed)
Pt called to advise that he needs authorization to come off his plavix, pt advised he had a stent placed on 03-2012, was advised in a year from the placement that he may be able to come off the plavix in order to have steroid injections in his back per back pain, pt notes his back pain is getting progressively worse and wanted to know if he can come off at this time, please advise

## 2013-01-21 NOTE — Telephone Encounter (Signed)
.  left message to have patient return my call.  

## 2013-01-21 NOTE — Telephone Encounter (Signed)
Discontinue Plavix. Continue aspirin on a daily basis indefinitely. Patient may proceed with epidural injections one week or more after discontinuing Plavix.  [place] Bing, MD

## 2013-01-22 NOTE — Telephone Encounter (Signed)
.  left message to have patient return my call.  

## 2013-01-22 NOTE — Telephone Encounter (Signed)
Spoke to pt to advise results/instructions. Pt understood. Reiterated with pt  Importance of waiting a week, pt will wait for a week and have Dr Juanetta Gosling call our office if further instructions are needed

## 2013-01-29 ENCOUNTER — Telehealth: Payer: Self-pay | Admitting: Cardiology

## 2013-01-29 NOTE — Telephone Encounter (Signed)
Needs RX TriCor sent to [facility] in [place]. /t gs

## 2013-01-29 NOTE — Telephone Encounter (Signed)
Noted pt has no showed and cancelled 2 apts with Dr Macarthur Critchley since his 06-15-12 OV when advised to f/u in 4 months, message sent to [facility] to contact pt for apt in order to continue to receive his medications

## 2013-01-30 ENCOUNTER — Other Ambulatory Visit: Payer: Self-pay | Admitting: *Deleted

## 2013-01-30 DIAGNOSIS — E782 Mixed hyperlipidemia: Secondary | ICD-10-CM

## 2013-01-31 MED ORDER — FENOFIBRATE 145 MG PO TABS: 145.0000 mg | ORAL_TABLET | Freq: Every day | ORAL | Status: DC

## 2013-01-31 NOTE — Telephone Encounter (Signed)
rx sent to pharmacy by e-script Per pt has scheduled a f/u apt

## 2013-02-12 LAB — HEPATIC FUNCTION PANEL
AST: 23 U/L (ref 0–37)
Alkaline Phosphatase: 35 U/L — ABNORMAL LOW (ref 39–117)
Bilirubin, Direct: 0.1 mg/dL (ref 0.0–0.3)
Total Bilirubin: 0.6 mg/dL (ref 0.3–1.2)

## 2013-02-12 LAB — LIPID PANEL: HDL: 46 mg/dL (ref >39)

## 2013-02-14 ENCOUNTER — Ambulatory Visit (INDEPENDENT_AMBULATORY_CARE_PROVIDER_SITE_OTHER): Payer: BC Managed Care – PPO | Admitting: Adult Health

## 2013-02-14 ENCOUNTER — Ambulatory Visit: Payer: Managed Care, Other (non HMO) | Admitting: Cardiology

## 2013-02-14 ENCOUNTER — Telehealth: Payer: Self-pay | Admitting: *Deleted

## 2013-02-14 ENCOUNTER — Encounter: Payer: Self-pay | Admitting: Adult Health

## 2013-02-14 VITALS — BP 143/83 | HR 63 | Ht 70.0 in | Wt 193.0 lb

## 2013-02-14 DIAGNOSIS — I2510 Atherosclerotic heart disease of native coronary artery without angina pectoris: Secondary | ICD-10-CM

## 2013-02-14 MED ORDER — PRAVASTATIN SODIUM 40 MG PO TABS: 40.0000 mg | ORAL_TABLET | Freq: Every evening | ORAL | Status: DC

## 2013-02-14 MED ORDER — METOPROLOL SUCCINATE ER 50 MG PO TB24: 50.0000 mg | ORAL_TABLET | Freq: Every day | ORAL | Status: DC

## 2013-02-14 MED ORDER — FENOFIBRATE 145 MG PO TABS: 145.0000 mg | ORAL_TABLET | Freq: Every day | ORAL | Status: DC

## 2013-02-14 MED ORDER — RANOLAZINE ER 500 MG PO TB12: 500.0000 mg | ORAL_TABLET | Freq: Two times a day (BID) | ORAL | Status: DC

## 2013-02-14 MED ORDER — HYDROCHLOROTHIAZIDE 12.5 MG PO CAPS: 12.5000 mg | ORAL_CAPSULE | ORAL | Status: DC

## 2013-02-14 MED ORDER — AMLODIPINE BESYLATE 5 MG PO TABS: ORAL_TABLET | ORAL | Status: DC

## 2013-02-14 NOTE — Progress Notes (Signed)
HPI: [name] is a 62-year-old patient of Dr. Dietrich Hobbs we are following for ongoing assessment and management of CAD and hypertension. The patient was last in the office in October 2013 and doing well. Patient has chronic back problems and is having epidural injections to assist with pain management. However they have been discontinued recently due to to meet take clopidogrel. He has missed several appointments. He is here for her prescription and refills and assessment.   Continues to have exertional angina, most of the time in the morning. Walking to mailbox or up a hill. Sometimes when playing golf. Had lab drawn yesterday in [place].  Allergies  Allergen Reactions  . Crestor (Rosuvastatin) Other (See Comments)    Myalgias; also Vytorin--  PT CAN SOMEWHAT TOLERATE CRESTOR 10 mg  . Isosorbide Mononitrate (Isosorbide) Other (See Comments)    Unable to tolerate Imdur at a dose of 60 mg secondary to malaise and headache  . Vytorin (Ezetimibe-Simvastatin) Other (See Comments)    Myalgias; also rosuvastatin     Current Outpatient Prescriptions  Medication Sig Dispense Refill  . amLODipine (NORVASC) 5 MG tablet TAKE 1 TABLET DAILY (DOSE INCREASE)  90 tablet  0  . aspirin 81 MG tablet Take 81 mg by mouth daily.      . fenofibrate (TRICOR) 145 MG tablet Take 1 tablet (145 mg total) by mouth daily.  90 tablet  0  . hydrochlorothiazide (MICROZIDE) 12.5 MG capsule Take 1 capsule (12.5 mg total) by mouth every other day.  45 capsule  3  . lisinopril (PRINIVIL,ZESTRIL) 5 MG tablet Take 1 tablet (5 mg total) by mouth daily.  90 tablet  3  . metoprolol succinate (TOPROL-XL) 50 MG 24 hr tablet Take 1 tablet (50 mg total) by mouth daily. Take with or immediately following a meal.  90 tablet  3  . niacin (NIASPAN) 1000 MG CR tablet Take 1 tablet (1,000 mg total) by mouth at bedtime.  30 tablet  6  . niacin (NIASPAN) 500 MG CR tablet Take 1 tablet (500 mg total) by mouth at bedtime.  30 tablet  6  .  nitroGLYCERIN (NITROSTAT) 0.4 MG SL tablet Place 1 tablet (0.4 mg total) under the tongue every 5 (five) minutes as needed for chest pain (up to 3 doses).  25 tablet  3  . pantoprazole (PROTONIX) 40 MG tablet Take 1 tablet (40 mg total) by mouth daily.  90 tablet  3  . pravastatin (PRAVACHOL) 40 MG tablet Take 1 tablet (40 mg total) by mouth every evening.  90 tablet  3  . Tapentadol HCl (NUCYNTA PO) Take 75 mg by mouth as needed.       No current facility-administered medications for this visit.    Past Medical History  Diagnosis Date  . 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 Tobacco abuse     remote; also remote moderate alcohol consumption  . Hyperlipidemia     triglycerides greater than 1000  . Coronary artery disease   . Myocardial infarction   . Anginal pain   . Pneumonia     hx of PNA  . Low back pain 04/27/2012    Past Surgical History  Procedure Laterality Date  . Cardiac catheterization    . Coronary angioplasty with stent placement  02/17/2012     proximal  LAD    ZOX:[id number] of systems complete and found to be negative unless listed above  PHYSICAL EXAM BP 143/83  Pulse 63  Ht 5\' 10"  (1.778 m)  Wt 193 lb (87.544 kg)  BMI 27.69 kg/m2  General: Well developed, well nourished, in no acute distress Head: Eyes PERRLA, No xanthomas.   Normal cephalic and atramatic  Lungs: Clear bilaterally to auscultation and percussion. Heart: HRRR S1 S2, without MRG.  Pulses are 2+ & equal.            No carotid bruit. No JVD.  No abdominal bruits. No femoral bruits. Abdomen: Bowel sounds are positive, abdomen soft and non-tender without masses or                  Hernia's noted. Msk:  Back normal, normal gait. Normal strength and tone for age. Extremities: No clubbing, cyanosis or edema.  DP +1 Neuro:  Alert and oriented X 3. Psych:  Good affect, responds appropriately  EKG:NSR rate of 63 bpm  ASSESSMENT AND PLAN

## 2013-02-14 NOTE — Patient Instructions (Signed)
Your physician recommends that you schedule a follow-up appointment in: **6 MONTHS   Your physician has recommended you make the following change in your medication: Ranexa 500 mg BID

## 2013-02-14 NOTE — Telephone Encounter (Signed)
Open in error

## 2013-02-14 NOTE — Assessment & Plan Note (Signed)
Weight loss and low carb diet is recommend; He admits that he has not always been adherent to this diet. Will await labs,

## 2013-02-14 NOTE — Assessment & Plan Note (Signed)
He continues to have exertional angina, now more frequent and usually in the mornings. He states that walking to the mailbox or up a hill usually elicits this. Last cath demonstrated  . Single vessel CAD with patent stented segment of the proximal and mid LAD   Severe ostial disease in both very small caliber diagonal branches secondary to being jailed by the stent. This could be causing angina however these vessels are very small and PCI is not an option . Normal LV systolic function  He refuses isosorbide. Ranexa will be intiated at 500mg  BID. He will check with his insurance as to what is covered to assess if he is willing to pay for this. I

## 2013-02-14 NOTE — Addendum Note (Signed)
Addended by: Thompson Grayer on: 02/14/2013 12:16 PM   Modules accepted: Orders

## 2013-02-14 NOTE — Assessment & Plan Note (Signed)
Mildly elevated today. Will not increase medications at this time as on last visit he was normal. Await labs for evaluation. Drawn in [place].

## 2013-02-15 ENCOUNTER — Telehealth: Payer: Self-pay | Admitting: *Deleted

## 2013-02-15 ENCOUNTER — Encounter: Payer: Self-pay | Admitting: Cardiology

## 2013-02-15 ENCOUNTER — Ambulatory Visit: Payer: Managed Care, Other (non HMO) | Admitting: Adult Health

## 2013-02-15 ENCOUNTER — Encounter: Payer: Self-pay | Admitting: *Deleted

## 2013-02-15 NOTE — Telephone Encounter (Signed)
Opened in error

## 2013-02-18 ENCOUNTER — Telehealth: Payer: Self-pay | Admitting: *Deleted

## 2013-02-18 ENCOUNTER — Encounter: Payer: Self-pay | Admitting: Adult Health

## 2013-02-18 MED ORDER — PANTOPRAZOLE SODIUM 40 MG PO TBEC: 40.0000 mg | DELAYED_RELEASE_TABLET | Freq: Every day | ORAL | Status: DC

## 2013-02-18 MED ORDER — NIACIN ER (ANTIHYPERLIPIDEMIC) 500 MG PO TBCR: 500.0000 mg | EXTENDED_RELEASE_TABLET | Freq: Every day | ORAL | Status: DC

## 2013-02-18 NOTE — Telephone Encounter (Signed)
Noted protocol for Niaspan to be given QHS, Patient request 3 pills daily, noted [facility] NP  Advised instructions via my chart as follows.   Daine Floras To Jodelle Gross, NP [[id number]] Composed 02/18/2013 9:09 AM For Delivery On 02/18/2013 9:09 AM Subject Visit Follow-Up Question Message Type Patient Medical Advice Request Read Status Y Message Body I need 2 refills called into [place] [facility] please:   Niacin (Niaspan) ER Tabs 500 mg / Take 3 tablets at bedtime / # 90 (this will be a 30 day supply, prescribed 1500 mg daily. I called my insurance this will be the less expensive way to get it instead of a bottle of 1000 and a bottle of 500, will be one copay instead of 2.) Please call in the generic.   Pantoprazole 40 mg (generic for protonix   Niaspan 500 mg tablet (3 tablets qhs) sent via escribe per [facility] directions, protonix also sent via escribe

## 2013-02-18 NOTE — Telephone Encounter (Signed)
PT NEED NIACIN CALLED IN PLEASE CALL IT IN FOR 500MG  THREE PILLS A DAY, INSURANCE WILL COVER BETTER THIS WAY TO [facility] IN [place].  PT ALSO NEEDS GENERIC FOR PROTONIX CALLED IN

## 2013-02-21 ENCOUNTER — Other Ambulatory Visit: Payer: Self-pay | Admitting: Adult Health

## 2013-02-22 NOTE — Telephone Encounter (Signed)
Noted sent on 02-18-13

## 2013-02-23 ENCOUNTER — Other Ambulatory Visit: Payer: Self-pay | Admitting: Cardiology

## 2013-02-25 ENCOUNTER — Telehealth: Payer: Self-pay | Admitting: Adult Health

## 2013-02-25 NOTE — Telephone Encounter (Signed)
Left message for patient to return my call.  To clarify which medication.

## 2013-02-25 NOTE — Telephone Encounter (Signed)
Medication sent via escribe.  

## 2013-02-25 NOTE — Telephone Encounter (Signed)
Please call patient's wife regarding RX that was not called in right. / tgs

## 2013-02-25 NOTE — Telephone Encounter (Signed)
Spoke with Diane and she advised she will change prescription for pt. Unaware of how much it will cost for pt. Pt is aware that prescription has been changed.

## 2013-02-25 NOTE — Telephone Encounter (Signed)
Open in error

## 2013-02-27 ENCOUNTER — Telehealth: Payer: Self-pay | Admitting: *Deleted

## 2013-02-27 NOTE — Telephone Encounter (Signed)
Left message for patient to return my call.to clarify where he had his last labs drawn per pcp advised he has not had labs since 2009.

## 2013-02-28 NOTE — Telephone Encounter (Signed)
[name], they have not done labs on pt since 2009. Called pt states that he had labs drawn at [facility] in [place] on May 28. Noted in Epic. Please advise if further labs need to added.

## 2013-03-04 NOTE — Telephone Encounter (Signed)
The labs in epic done on May 28 was drawn in [place] per [facility] rep. Please clarify if any further labs need to be drawn.

## 2013-03-04 NOTE — Telephone Encounter (Signed)
Please get labs from [place] if possible.  Thanks

## 2013-03-04 NOTE — Telephone Encounter (Signed)
No further labs.

## 2013-04-16 ENCOUNTER — Telehealth: Payer: Self-pay | Admitting: *Deleted

## 2013-04-16 MED ORDER — RANOLAZINE ER 500 MG PO TB12: 500.0000 mg | ORAL_TABLET | Freq: Two times a day (BID) | ORAL | Status: DC

## 2013-04-16 NOTE — Telephone Encounter (Signed)
Refill

## 2013-04-16 NOTE — Telephone Encounter (Signed)
Pt needs samples of renexa and niaspan/ please check with [place] also.

## 2013-04-22 ENCOUNTER — Encounter (INDEPENDENT_AMBULATORY_CARE_PROVIDER_SITE_OTHER): Payer: Self-pay

## 2013-04-22 NOTE — Telephone Encounter (Signed)
Pending call back from [address] rep for reneexa rep in the area, was advised niaspan no longer has samples to provide to pt

## 2013-04-24 MED ORDER — NIACIN ER (ANTIHYPERLIPIDEMIC) 500 MG PO TBCR: 500.0000 mg | EXTENDED_RELEASE_TABLET | Freq: Every day | ORAL | Status: DC

## 2013-04-24 NOTE — Telephone Encounter (Signed)
Pt had renexa sent to pharmacy, was  Advised that Niaspan no longer makes samples per generic, sent new rx for niaspan to pt pharmacy

## 2013-04-24 NOTE — Addendum Note (Signed)
Addended by: Derry Lory A on: 04/24/2013 01:52 PM   Modules accepted: Orders

## 2013-04-29 ENCOUNTER — Telehealth: Payer: Self-pay | Admitting: *Deleted

## 2013-04-29 NOTE — Telephone Encounter (Signed)
Pt needs lisinopril called in to new pharmacy[facility] in [place].  Please also check to see is we have Renexa samples.

## 2013-04-30 MED ORDER — LISINOPRIL 5 MG PO TABS: 5.0000 mg | ORAL_TABLET | Freq: Every day | ORAL | Status: DC

## 2013-04-30 NOTE — Telephone Encounter (Signed)
rx sent to pharmacy by e-script Unable to locate renexa rep in this area, called pt to advise rep is pending, left message

## 2013-05-03 NOTE — Telephone Encounter (Signed)
.  left message to have patient return my call to advise I am still trying to locate a local renexa rep in the area and as soon as I get some samples in I will give him a call

## 2013-05-16 NOTE — Telephone Encounter (Signed)
This nurse contacted [employer] at [phone] to see if a rep in the area could start bringing samples to our office, spoke to rep that transferred call to [phone] and was given ticket number [id number] to advise a rep has been contacted in our area to call our office to set up sample delivery, will contact pt once set up, noted D.C to contact this nurse

## 2013-06-03 NOTE — Telephone Encounter (Signed)
This nurse left another message with a [employer] rep to advise no one has called me from their office to have a rep in our area bring samples, therefore after notation of non responses from the [employer] company I have chosen to mail out a letter to contact the company via mail, message left to advise pt the contact is still yet to be made and as soon as we get a response and samples back in our office, we will contact him to advise to please Pt will call back to our office with any further concerns if necessary

## 2013-07-11 ENCOUNTER — Other Ambulatory Visit: Payer: Self-pay

## 2013-09-02 ENCOUNTER — Telehealth: Payer: Self-pay | Admitting: Adult Health

## 2013-09-02 ENCOUNTER — Other Ambulatory Visit: Payer: Self-pay | Admitting: Adult Health

## 2013-09-02 MED ORDER — NITROGLYCERIN 0.4 MG SL SUBL: 0.4000 mg | SUBLINGUAL_TABLET | SUBLINGUAL | Status: DC | PRN

## 2013-09-02 NOTE — Telephone Encounter (Signed)
Wants RX for NTG sent to [facility] in [place] / tgs

## 2013-09-02 NOTE — Telephone Encounter (Signed)
rx for NTG to pharmacy as requested

## 2013-09-09 ENCOUNTER — Ambulatory Visit: Payer: BC Managed Care – PPO | Admitting: Adult Health

## 2013-09-11 ENCOUNTER — Encounter: Payer: Self-pay | Admitting: Adult Health

## 2013-09-11 ENCOUNTER — Ambulatory Visit (INDEPENDENT_AMBULATORY_CARE_PROVIDER_SITE_OTHER): Payer: BC Managed Care – PPO | Admitting: Adult Health

## 2013-09-11 VITALS — BP 133/68 | HR 65 | Ht 70.0 in | Wt 191.0 lb

## 2013-09-11 DIAGNOSIS — I2510 Atherosclerotic heart disease of native coronary artery without angina pectoris: Secondary | ICD-10-CM

## 2013-09-11 DIAGNOSIS — I10 Essential (primary) hypertension: Secondary | ICD-10-CM

## 2013-09-11 DIAGNOSIS — I7090 Unspecified atherosclerosis: Secondary | ICD-10-CM

## 2013-09-11 MED ORDER — AMLODIPINE BESYLATE 5 MG PO TABS: 2.5000 mg | ORAL_TABLET | Freq: Every day | ORAL | Status: DC

## 2013-09-11 MED ORDER — HYDROCHLOROTHIAZIDE 12.5 MG PO CAPS: 12.5000 mg | ORAL_CAPSULE | ORAL | Status: DC | PRN

## 2013-09-11 MED ORDER — RANOLAZINE ER 1000 MG PO TB12: ORAL_TABLET | ORAL | Status: DC

## 2013-09-11 NOTE — Assessment & Plan Note (Signed)
BP is slightly low at home recordings. Today in the office it is normal He wishes to cut down on amlodipine dose. We will have a trial of this, with repeat BP checks. He will call us for ongoing recordings. He is given parameters for BP systolic > 076 or < than 226 mmHg.

## 2013-09-11 NOTE — Patient Instructions (Addendum)
Your physician recommends that you schedule a follow-up appointment in: 3 months  Your physician recommends that you return for lab work in: today . BMET  Your physician has recommended you make the following change in your medication:  1. STOP Lisinopril 2. Decrease Amlodipine to 2.5 mg daily 3. Changed HCTZ to as needed 4. Start Ranexa 1000 mg in the am and 500 mg in the evening.  Please call our office next week to see how your feeling.

## 2013-09-11 NOTE — Progress Notes (Deleted)
Name: [name]    DOB: Dec 26, 1950  Age: 63 y.o.  MR#: [id number]       PCP:  Glenda Chroman., MD      Insurance: Payor: [place] / Plan: [employer] [place] PPO / Product Type: *No Product type* /   CC:    Chief Complaint  Patient presents with  . Coronary Artery Disease  . Hypertension    VS Filed Vitals:   09/11/13 1502  BP: 133/68  Pulse: 65  Height: 5\' 10"  (1.778 m)  Weight: 191 lb (86.637 kg)    Weights Current Weight  09/11/13 191 lb (86.637 kg)  02/14/13 193 lb (87.544 kg)  06/15/12 198 lb (89.812 kg)    Blood Pressure  BP Readings from Last 3 Encounters:  09/11/13 133/68  02/14/13 143/83  06/15/12 130/72     Admit date:  (Not on file) Last encounter with RMR:  09/09/2013   Allergy Crestor; Isosorbide mononitrate; and Vytorin  Current Outpatient Prescriptions  Medication Sig Dispense Refill  . amLODipine (NORVASC) 5 MG tablet TAKE 1 TABLET DAILY (DOSE INCREASE)  90 tablet  1  . aspirin 81 MG tablet Take 81 mg by mouth daily.      . fenofibrate (TRICOR) 145 MG tablet Take 1 tablet (145 mg total) by mouth daily.  90 tablet  1  . hydrochlorothiazide (MICROZIDE) 12.5 MG capsule Take 1 capsule (12.5 mg total) by mouth every other day.  45 capsule  3  . metoprolol succinate (TOPROL-XL) 50 MG 24 hr tablet TAKE 1 TABLET DAILY. TAKE WITH OR IMMEDIATELY FOLLOWING A MEAL.  90 tablet  2  . niacin (NIASPAN) 500 MG CR tablet Take 1,500 mg by mouth at bedtime.      . nitroGLYCERIN (NITROSTAT) 0.4 MG SL tablet Place 1 tablet (0.4 mg total) under the tongue every 5 (five) minutes as needed for chest pain (up to 3 doses).  25 tablet  3  . pantoprazole (PROTONIX) 40 MG tablet TAKE ONE TABLET BY MOUTH ONCE DAILY  90 tablet  4  . pravastatin (PRAVACHOL) 40 MG tablet Take 1 tablet (40 mg total) by mouth every evening.  90 tablet  2  . pregabalin (LYRICA) 75 MG capsule Take 75 mg by mouth 2 (two) times daily.      . ranolazine (RANEXA) 500 MG 12 hr tablet Take 1 tablet (500 mg  total) by mouth 2 (two) times daily.  60 tablet  6  . Tapentadol HCl (NUCYNTA PO) Take 75 mg by mouth as needed.       No current facility-administered medications for this visit.    Discontinued Meds:    Medications Discontinued During This Encounter  Medication Reason  . lisinopril (PRINIVIL,ZESTRIL) 5 MG tablet Error  . metoprolol succinate (TOPROL-XL) 50 MG 24 hr tablet Error  . niacin (NIASPAN) 500 MG CR tablet     Patient Active Problem List   Diagnosis Date Noted  . Low back pain 04/27/2012  . Hypertension 02/10/2012  . Hyperlipidemia   . Arteriosclerotic cardiovascular disease (ASCVD) 02/03/2006    LABS    Component Value Date/Time   NA 141 03/21/2012 1241   NA 141 03/06/2012 0000   NA 140 02/18/2012 0415   K 4.0 03/21/2012 1241   K 4.3 03/06/2012 0000   K 3.9 02/18/2012 0415   CL 105 03/21/2012 1241   CL 105 03/06/2012 0000   CL 102 02/18/2012 0415   CO2 25 03/21/2012 1241   CO2 27 03/06/2012  0000   CO2 28 02/18/2012 0415   GLUCOSE 85 03/21/2012 1241   GLUCOSE 93 03/06/2012 0000   GLUCOSE 107* 02/18/2012 0415   BUN 18 03/21/2012 1241   BUN 13 03/06/2012 0000   BUN 15 02/18/2012 0415   CREATININE 0.95 03/21/2012 1241   CREATININE 1.04 03/06/2012 0000   CREATININE 1.00 02/18/2012 0415   CREATININE 0.91 02/14/2012 1202   CALCIUM 9.8 03/21/2012 1241   CALCIUM 9.8 03/06/2012 0000   CALCIUM 9.6 02/18/2012 0415   GFRNONAA 80* 02/18/2012 0415   GFRAA >90 02/18/2012 0415   CMP     Component Value Date/Time   NA 141 03/21/2012 1241   K 4.0 03/21/2012 1241   CL 105 03/21/2012 1241   CO2 25 03/21/2012 1241   GLUCOSE 85 03/21/2012 1241   BUN 18 03/21/2012 1241   CREATININE 0.95 03/21/2012 1241   CREATININE 1.00 02/18/2012 0415   CALCIUM 9.8 03/21/2012 1241   PROT 7.0 01/30/2013 1311   ALBUMIN 4.6 01/30/2013 1311   AST 23 01/30/2013 1311   ALT 27 01/30/2013 1311   ALKPHOS 35* 01/30/2013 1311   BILITOT 0.6 01/30/2013 1311   GFRNONAA 80* 02/18/2012 0415   GFRAA >90 02/18/2012 0415       Component  Value Date/Time   WBC 3.9* 03/21/2012 1241   WBC 4.3 02/18/2012 0415   WBC 4.4 02/14/2012 1202   HGB 13.5 03/21/2012 1241   HGB 14.0 02/18/2012 0415   HGB 14.7 02/14/2012 1202   HCT 38.7* 03/21/2012 1241   HCT 39.9 02/18/2012 0415   HCT 42.6 02/14/2012 1202   MCV 89.8 03/21/2012 1241   MCV 90.5 02/18/2012 0415   MCV 91.4 02/14/2012 1202    Lipid Panel     Component Value Date/Time   CHOL 185 01/30/2013 1311   TRIG 261* 01/30/2013 1311   HDL 46 01/30/2013 1311   CHOLHDL 4.0 01/30/2013 1311   VLDL 52* 01/30/2013 1311   LDLCALC 87 01/30/2013 1311    ABG No results found for this basename: phart, pco2, pco2art, po2, po2art, hco3, tco2, acidbasedef, o2sat     No results found for this basename: TSH   BNP (last 3 results) No results found for this basename: PROBNP,  in the last 8760 hours Cardiac Panel (last 3 results) No results found for this basename: CKTOTAL, CKMB, TROPONINI, RELINDX,  in the last 72 hours  Iron/TIBC/Ferritin No results found for this basename: iron, tibc, ferritin     EKG Orders placed in visit on 02/14/13  . EKG 12-LEAD     Prior Assessment and Plan Problem List as of 09/11/2013   Arteriosclerotic cardiovascular disease (ASCVD)   Last Assessment & Plan   02/14/2013 Office Visit Written 02/14/2013 11:45 AM by Lendon Colonel, NP     He continues to have exertional angina, now more frequent and usually in the mornings. He states that walking to the mailbox or up a hill usually elicits this. Last cath demonstrated  . Single vessel CAD with patent stented segment of the proximal and mid LAD   Severe ostial disease in both very small caliber diagonal branches secondary to being jailed by the stent. This could be causing angina however these vessels are very small and PCI is not an option . Normal LV systolic function  He refuses isosorbide. Ranexa will be intiated at 500mg  BID. He will check with his insurance as to what is covered to assess if he is willing to pay for  this. I  Hyperlipidemia   Last Assessment & Plan   02/14/2013 Office Visit Written 02/14/2013 11:47 AM by Lendon Colonel, NP     Weight loss and low carb diet is recommend; He admits that he has not always been adherent to this diet. Will await labs,    Hypertension   Last Assessment & Plan   02/14/2013 Office Visit Written 02/14/2013 11:46 AM by Lendon Colonel, NP     Mildly elevated today. Will not increase medications at this time as on last visit he was normal. Await labs for evaluation. Drawn in [place].    Low back pain   Last Assessment & Plan   06/15/2012 Office Visit Written 06/15/2012  4:17 PM by Yehuda Savannah, MD     Patient has done fairly well without epidural steroid injections.  If discomfort increases and is not well-controlled, we can consider early termination of clopidogrel.        Imaging: No results found.

## 2013-09-11 NOTE — Assessment & Plan Note (Signed)
He is having some recurrence of angina symptoms. Only with significant exertion. I will increase ranalozone to 1000 mg in am and keep him at 500mg  in the pm. He will call us for report of how he is feeling.

## 2013-09-11 NOTE — Progress Notes (Signed)
HPI: [name] is a 63 year old former patient of Dr. Lattie Haw we are following for ongoing assessment and management of CAD and hypertension. Patient was last seen in the office in June of 2014. He has chronic back problems and is having epidural injections to assist with pain management. He continued to have exertional angina most of the time in the morning walking to his mailbox or up a hill.   On last visit the patient cardiac catheterization was reviewed revealing single-vessel CAD with a patent stented segment of the proximal to mid LAD. He had severe ostial disease in those very small caliber diagonal branches secondary to being jailed by the stent. He was placed on Ranexa 500 mg twice a day. He was recommended for weight loss and low calorie diet.    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 The Ranexa initially helped but he is having some slight chest pain again. He is also being treated for gout.    He has made some hypotension lately and has stopped taking lisinopril. He is symptomatic with dizziness.      Allergies  Allergen Reactions  . Crestor [Rosuvastatin] Other (See Comments)    Myalgias; also Vytorin--  PT CAN SOMEWHAT TOLERATE CRESTOR 10 mg  . Isosorbide Mononitrate [Isosorbide] Other (See Comments)    Unable to tolerate Imdur at a dose of 60 mg secondary to malaise and headache  . Vytorin [Ezetimibe-Simvastatin] Other (See Comments)    Myalgias; also rosuvastatin     Current Outpatient Prescriptions  Medication Sig Dispense Refill  . amLODipine (NORVASC) 5 MG tablet TAKE 1 TABLET DAILY (DOSE INCREASE)  90 tablet  1  . aspirin 81 MG tablet Take 81 mg by mouth daily.      . fenofibrate (TRICOR) 145 MG tablet Take 1 tablet (145 mg total) by mouth daily.  90 tablet  1  . hydrochlorothiazide (MICROZIDE) 12.5 MG capsule Take 1 capsule (12.5 mg total) by mouth every other day.  45 capsule  3  .  metoprolol succinate (TOPROL-XL) 50 MG 24 hr tablet TAKE 1 TABLET DAILY. TAKE WITH OR IMMEDIATELY FOLLOWING A MEAL.  90 tablet  2  . niacin (NIASPAN) 500 MG CR tablet Take 1,500 mg by mouth at bedtime.      . nitroGLYCERIN (NITROSTAT) 0.4 MG SL tablet Place 1 tablet (0.4 mg total) under the tongue every 5 (five) minutes as needed for chest pain (up to 3 doses).  25 tablet  3  . pantoprazole (PROTONIX) 40 MG tablet TAKE ONE TABLET BY MOUTH ONCE DAILY  90 tablet  4  . pravastatin (PRAVACHOL) 40 MG tablet Take 1 tablet (40 mg total) by mouth every evening.  90 tablet  2  . pregabalin (LYRICA) 75 MG capsule Take 75 mg by mouth 2 (two) times daily.      . ranolazine (RANEXA) 500 MG 12 hr tablet Take 1 tablet (500 mg total) by mouth 2 (two) times daily.  60 tablet  6  . Tapentadol HCl (NUCYNTA PO) Take 75 mg by mouth as needed.       No current facility-administered medications for this visit.    Past Medical History  Diagnosis Date  . 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 Tobacco abuse  remote; also remote moderate alcohol consumption  . Hyperlipidemia     triglycerides greater than 1000  . Coronary artery disease   . Myocardial infarction   . Anginal pain   . Pneumonia     hx of PNA  . Low back pain 04/27/2012    Past Surgical History  Procedure Laterality Date  . Cardiac catheterization    . Coronary angioplasty with stent placement  02/17/2012     proximal LAD    PFY:[id number] of systems complete and found to be negative unless listed above  PHYSICAL EXAM BP 133/68  Pulse 65  Ht 5\' 10"  (1.778 m)  Wt 191 lb (86.637 kg)  BMI 27.41 kg/m2  General: Well developed, well nourished, in no acute distress Head: Eyes PERRLA, No xanthomas.   Normal cephalic and atramatic  Lungs: Clear bilaterally to  auscultation and percussion. Heart: HRRR S1 S2, without MRG.  Pulses are 2+ & equal.            No carotid bruit. No JVD.  No abdominal bruits. No femoral bruits. Abdomen: Bowel sounds are positive, abdomen soft and non-tender without masses or                  Hernia's noted. Msk:  Back normal, normal gait. Normal strength and tone for age. Extremities: No clubbing, cyanosis or edema.  DP +1 Neuro: Alert and oriented X 3. Psych:  Good affect, responds appropriately    ASSESSMENT AND PLAN

## 2013-09-12 ENCOUNTER — Encounter: Payer: Self-pay | Admitting: *Deleted

## 2013-09-12 ENCOUNTER — Telehealth: Payer: Self-pay

## 2013-09-12 LAB — BASIC METABOLIC PANEL
BUN: 13 mg/dL (ref 6–23)
CALCIUM: 10.1 mg/dL (ref 8.4–10.5)
CHLORIDE: 101 meq/L (ref 96–112)
CO2: 28 meq/L (ref 19–32)
CREATININE: 1.03 mg/dL (ref 0.50–1.35)
GLUCOSE: 84 mg/dL (ref 70–99)
Potassium: 4.5 mEq/L (ref 3.5–5.3)
Sodium: 140 mEq/L (ref 135–145)

## 2013-09-12 MED ORDER — AMLODIPINE BESYLATE 5 MG PO TABS: 2.5000 mg | ORAL_TABLET | Freq: Every day | ORAL | Status: DC

## 2013-09-12 NOTE — Telephone Encounter (Signed)
Received fax stating 2 sets of instructions for last script.  Placed in refill basket for review.

## 2013-09-22 ENCOUNTER — Encounter: Payer: Self-pay | Admitting: Adult Health

## 2013-09-23 NOTE — Telephone Encounter (Signed)
Please advise pt question below

## 2013-10-03 ENCOUNTER — Encounter: Payer: Self-pay | Admitting: Adult Health

## 2013-10-03 MED ORDER — RANOLAZINE ER 1000 MG PO TB12: 1000.0000 mg | ORAL_TABLET | Freq: Two times a day (BID) | ORAL | Status: DC

## 2013-10-03 NOTE — Telephone Encounter (Signed)
Message copied by Truett Mainland on Thu Oct 03, 2013  1:30 PM ------      Message from: Lendon Colonel      Created: Thu Oct 03, 2013 12:56 PM      Regarding: Ranxea Rx       Please call in Ranxea 1000 mg BID for this patient.We increased the dose on last visit for 500 mg BID to 1000 mg BID as a trial to see if it helped his angina. He is better and needs Rx for higher dose to last him the whole month as he doubled up his 500 mg tablets. ------

## 2013-12-10 ENCOUNTER — Telehealth: Payer: Self-pay | Admitting: Cardiovascular Disease

## 2013-12-10 NOTE — Telephone Encounter (Signed)
Made pt aware that the doctor will let him know what labs he wants done, if any, wants he comes for his visit. Pt understood.

## 2014-01-05 ENCOUNTER — Other Ambulatory Visit: Payer: Self-pay | Admitting: Adult Health

## 2014-01-06 ENCOUNTER — Encounter: Payer: Self-pay | Admitting: Cardiovascular Disease

## 2014-01-06 ENCOUNTER — Ambulatory Visit (INDEPENDENT_AMBULATORY_CARE_PROVIDER_SITE_OTHER): Payer: BC Managed Care – PPO | Admitting: Cardiovascular Disease

## 2014-01-06 VITALS — BP 138/68 | HR 68 | Ht 70.0 in | Wt 177.4 lb

## 2014-01-06 DIAGNOSIS — Z7182 Exercise counseling: Secondary | ICD-10-CM

## 2014-01-06 DIAGNOSIS — I10 Essential (primary) hypertension: Secondary | ICD-10-CM

## 2014-01-06 DIAGNOSIS — Z789 Other specified health status: Secondary | ICD-10-CM

## 2014-01-06 DIAGNOSIS — I2510 Atherosclerotic heart disease of native coronary artery without angina pectoris: Secondary | ICD-10-CM

## 2014-01-06 DIAGNOSIS — E782 Mixed hyperlipidemia: Secondary | ICD-10-CM

## 2014-01-06 DIAGNOSIS — Z713 Dietary counseling and surveillance: Secondary | ICD-10-CM

## 2014-01-06 NOTE — Patient Instructions (Signed)
Your physician wants you to follow-up in: 6 months You will receive a reminder letter in the mail two months in advance. If you don't receive a letter, please call our office to schedule the follow-up appointment.     Your physician recommends that you continue on your current medications as directed. Please refer to the Current Medication list given to you today.      Thank you for choosing [facility] !        

## 2014-01-06 NOTE — Progress Notes (Signed)
Patient ID: [name], male   DOB: Dec 20, 1950, 63 y.o.   MRN: [id number]      SUBJECTIVE: Mr. Callicott is here to followup for coronary artery disease, hypertension, and hyperlipidemia. He has a history of prior percutaneous coronary intervention. Cardiac catheterization in July 2013 revealed the following:  1. Single vessel CAD with patent stented segment of the proximal and mid LAD. 2. Severe ostial disease in both very small caliber diagonal branches secondary to being jailed by the stent. This could be causing angina however these vessels are very small and PCI is not an option. 3. Normal LV systolic function.  He stopped taking pravastatin, TriCor, and niacin on his own, and his lower extremity myalgias and facial flushing have completely resolved. He has recently started working in receiving unloading boxes. He very seldom experiences some mild chest pains. He took one sublingual nitroglycerin last week and this was the first time he had taken any in several months. He eats fish twice a week and also takes fish oil. He would like to get back to walking more but has had some back discomfort. He recently had blood work drawn at the [facility] and tells me that his total cholesterol and triglycerides were markedly elevated.    Allergies  Allergen Reactions  . Crestor [Rosuvastatin] Other (See Comments)    Myalgias; also Vytorin--  PT CAN SOMEWHAT TOLERATE CRESTOR 10 mg  . Isosorbide Mononitrate [Isosorbide] Other (See Comments)    Unable to tolerate Imdur at a dose of 60 mg secondary to malaise and headache  . Vytorin [Ezetimibe-Simvastatin] Other (See Comments)    Myalgias; also rosuvastatin     Current Outpatient Prescriptions  Medication Sig Dispense Refill  . amLODipine (NORVASC) 5 MG tablet Take 2.5 mg by mouth daily.      Marland Kitchen aspirin 81 MG tablet Take 81 mg by mouth daily.      . hydrochlorothiazide (MICROZIDE) 12.5 MG capsule Take 1 capsule (12.5 mg total) by mouth as  needed.  45 capsule  3  . metoprolol succinate (TOPROL-XL) 50 MG 24 hr tablet TAKE ONE TABLET BY MOUTH ONCE DAILY. TAKE WITH OR IMMEDIATELY FOLLOWING A MEAL  90 tablet  3  . nitroGLYCERIN (NITROSTAT) 0.4 MG SL tablet Place 1 tablet (0.4 mg total) under the tongue every 5 (five) minutes as needed for chest pain (up to 3 doses).  25 tablet  3  . pantoprazole (PROTONIX) 40 MG tablet TAKE ONE TABLET BY MOUTH ONCE DAILY  90 tablet  4  . pregabalin (LYRICA) 75 MG capsule Take 75 mg by mouth 2 (two) times daily.      . ranolazine (RANEXA) 1000 MG SR tablet Take 1 tablet (1,000 mg total) by mouth 2 (two) times daily. .  60 tablet  3  . Tapentadol HCl (NUCYNTA PO) Take 75 mg by mouth as needed.      . fenofibrate (TRICOR) 145 MG tablet Take 145 mg by mouth daily. Pt states he stopped taking this about 3 months (01/06/14)      . niacin (NIASPAN) 500 MG CR tablet Take 1,500 mg by mouth at bedtime. Pt states he stopped taking this about 3 months (01/06/14)      . pravastatin (PRAVACHOL) 40 MG tablet Take 40 mg by mouth every evening. Pt stated he quit taking this about 3 months ago (01/06/14)       No current facility-administered medications for this visit.    Past Medical History  Diagnosis Date  .  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 Tobacco abuse     remote; also remote moderate alcohol consumption  . Hyperlipidemia     triglycerides greater than 1000  . Coronary artery disease   . Myocardial infarction   . Anginal pain   . Pneumonia     hx of PNA  . Low back pain 04/27/2012    Past Surgical History  Procedure Laterality Date  . Cardiac catheterization    . Coronary angioplasty with stent placement  02/17/2012     proximal LAD    History   Social History  . Marital Status: Married    Spouse Name: N/A    Number of  Children: N/A  . Years of Education: N/A   Occupational History  . Not on file.   Social History Main Topics  . Smoking status: Former Smoker    Quit date: 09/05/1998  . Smokeless tobacco: Never Used  . Alcohol Use: Yes     Comment: OCCASIONAL  . Drug Use: No  . Sexual Activity: Not on file   Other Topics Concern  . Not on file   Social History Narrative  . No narrative on file     Filed Vitals:   01/06/14 1454  BP: 138/68  Pulse: 68  Height: 5\' 10"  (1.778 m)  Weight: 177 lb 6.4 oz (80.468 kg)    PHYSICAL EXAM General: NAD Neck: No JVD, no thyromegaly. Lungs: Clear to auscultation bilaterally with normal respiratory effort. CV: Nondisplaced PMI.  Regular rate and rhythm, normal S1/S2, no S3/S4, no murmur. No pretibial or periankle edema.  No carotid bruit.  Normal pedal pulses.  Abdomen: Soft, nontender, no hepatosplenomegaly, no distention.  Neurologic: Alert and oriented x 3.  Psych: Normal affect. Extremities: No clubbing or cyanosis.   ECG: reviewed and available in electronic records.      ASSESSMENT AND PLAN: 1. CAD: This appears to be symptomatically stable on aspirin, metoprolol, and Ranexa. He appears to be intolerant to several statins. He would likely be a good candidate for PCSK9 inhibitors. 2. Hypertension: His blood pressure is high normal on amlodipine 2.5 mg daily. He previously took 5 mg daily and had been symptomatic with dizziness with systolic readings of 591 mmHg. He is planning on exercising more frequently. 3. Hyperlipidemia: I will try and obtain the results of his lipid panel drawn at the [place]. He is intolerant to statins. As stated previously, he may be a good candidate for PCSK9 inhibitors. For the time being, he is attempting to control this with dietary and exercise modification.  Dispo: f/u 6 months.  Kate Sable, M.D., F.A.C.C.

## 2014-01-10 ENCOUNTER — Encounter: Payer: Self-pay | Admitting: Cardiovascular Disease

## 2014-03-18 ENCOUNTER — Telehealth: Payer: Self-pay

## 2014-03-18 NOTE — Telephone Encounter (Signed)
LMTCB

## 2014-03-18 NOTE — Telephone Encounter (Signed)
Message copied by Bernita Raisin on Tue Mar 18, 2014  1:11 PM ------      Message from: Kate Sable A      Created: Tue Mar 18, 2014 12:58 PM       His lipids are markedly elevated. I want to see if he will tolerate a very low-dose statin, Lipitor 10 mg daily. If he does not, he would be a candidate for the new PCSK-9 inhibitors.            Jamesetta So ------

## 2014-03-20 ENCOUNTER — Telehealth: Payer: Self-pay

## 2014-03-20 DIAGNOSIS — E785 Hyperlipidemia, unspecified: Secondary | ICD-10-CM

## 2014-03-20 NOTE — Telephone Encounter (Signed)
LMTCB

## 2014-03-20 NOTE — Telephone Encounter (Signed)
Pt had repeat labs in June 2015 at the [facility]    Total cholesterol  286, Triglycerides 409, HDL 47, LDL 178    Since above labs, pt has made drastic diet change,no longer eats out.Previous wt 190 lbs, now 168 lbs He wonders if he could recheck cholesterol again before he sees our pharmacists at lipid clinic. He believes he has tried Lipitor and cannot tolerate

## 2014-03-21 NOTE — Addendum Note (Signed)
Addended by: Barbarann Ehlers A on: 03/21/2014 01:10 PM   Modules accepted: Orders

## 2014-03-21 NOTE — Telephone Encounter (Signed)
That would be fine. Repeat labs in October.

## 2014-03-24 NOTE — Telephone Encounter (Signed)
I left Dr.koneswaran's message for pt on home line and have mailed him a lab slip to repeat lipids in [place] hold off on statins until after repeat labs

## 2014-04-08 ENCOUNTER — Telehealth: Payer: Self-pay

## 2014-04-08 DIAGNOSIS — E785 Hyperlipidemia, unspecified: Secondary | ICD-10-CM

## 2014-04-08 NOTE — Telephone Encounter (Signed)
[name] has been on Tricor 145 mg 09/2012,  Pravachol 20 mg  04/2012, and Crestor 10 mg  02/2012

## 2014-04-09 NOTE — Telephone Encounter (Signed)
Patient has a h/o of ASCVD, private insurance ([employer]), and failed a few statins.  Neither statin was at lowest available dose (Crestor 10 mg qd and Pravastatin 20 mg qd), so need to see if he can tolerate this before trying to get on Praluent based on verbiage of the indication.  Please have patient try pravastatin 10 mg once daily, since we already know he can't take pravastatin 20 mg qd.  If he is able to tolerate pravastatin 10 mg qd, we would be able to get him onto Praluent (PCSK-9 inhibitor) for $10 per month as long as LDL remains > 70 mg/dL on pravastatin alone.  If he develops muscle aches on pravastatin 10 mg qd, have him cut down to 1/2 tablet daily, or try pravastatin every other day.  Once we get him on highest tolerated dose, would need to check a lipid panel and hepatic panel.  As long as LDL is > 70, I should hopefully be able to get him on Praluent.

## 2014-04-09 NOTE — Telephone Encounter (Signed)
Agree with Jeremy's recommendations. Start pravastatin 10 mg daily and check lipids and LFT's 8 weeks later.

## 2014-04-11 MED ORDER — PRAVASTATIN SODIUM 10 MG PO TABS: 10.0000 mg | ORAL_TABLET | Freq: Every day | ORAL | Status: DC

## 2014-04-11 NOTE — Telephone Encounter (Signed)
Called to [place] Pravastatin 10 mg #30 rf 3 as escribe system is down  Lab slip mailed to pt

## 2014-04-11 NOTE — Addendum Note (Signed)
Addended by: Barbarann Ehlers A on: 04/11/2014 04:59 PM   Modules accepted: Orders

## 2014-04-15 NOTE — Telephone Encounter (Signed)
Patient states he has stopped the medication he started last week because it was giving him muscle aches and pain.  He would like for Cathey to call him on his cell number tomorrow.  His number is [phone].

## 2014-04-16 NOTE — Telephone Encounter (Signed)
As he did not tolerate even low dose pravastatin, will ask Alferd Apa to assist with PCSK-9 inhibitor.

## 2014-04-16 NOTE — Telephone Encounter (Signed)
Pt had immediate muscle pain after starting pravastin   Will forward to Dr.Koneswaran

## 2014-04-18 NOTE — Telephone Encounter (Signed)
Spoke with patient and wife about getting set up for Praluent.  Explained I would need documentation of him failing multiple statins and a copy of his most recent lipid panel from the [place] from 02/2014.  He apparently failed lipitor and vytorin ~ 2007 - 2008 with Dr. Woody Seller [facility]) and/or Dr. Melina Copa.  Patient failed pravastatin 10 mg recently, Crestor 10 mg qd 02/2012, pravastatin 20 mg 04/2012, and Tricor 09/2012.  Has ASCVD and [employer].  [facility] was [facility] 178 mg[facility] but I need these labs.  Wife agreeable to call Dr. Woody Seller and Dr. Carmie End office to get documentation of lipitor and vytorin failure, including date, dose, and note.  She will also get a copy of the labs from [facility] 02/2014 faxed to me.  I have given her my phone and fax, and she will call once this has been done, and we can have husband come in to see me to fill out paperwork, send prescription, give Praluent samples, and set up future lipid / liver panel.    Will await blood work and office notes to have enough documentation to get Praluent covered.

## 2014-06-05 ENCOUNTER — Other Ambulatory Visit: Payer: Self-pay | Admitting: Adult Health

## 2014-06-10 ENCOUNTER — Other Ambulatory Visit: Payer: Self-pay | Admitting: Adult Health

## 2014-07-21 ENCOUNTER — Other Ambulatory Visit (INDEPENDENT_AMBULATORY_CARE_PROVIDER_SITE_OTHER): Payer: Self-pay | Admitting: *Deleted

## 2014-07-21 ENCOUNTER — Telehealth (INDEPENDENT_AMBULATORY_CARE_PROVIDER_SITE_OTHER): Payer: Self-pay | Admitting: *Deleted

## 2014-07-21 DIAGNOSIS — K30 Functional dyspepsia: Secondary | ICD-10-CM

## 2014-07-21 DIAGNOSIS — R1310 Dysphagia, unspecified: Secondary | ICD-10-CM

## 2014-07-21 NOTE — Telephone Encounter (Signed)
Referring MD/PCP: vyas   Procedure: egd/ed  Reason/Indication:  Dysphagia, indigestion  Has patient had this procedure before?  no  If so, when, by whom and where?    Is there a family history of colon cancer?    Who?  What age when diagnosed?    Is patient diabetic?   no      Does patient have prosthetic heart valve?  No  Do you have a pacemaker?  no  Has patient ever had endocarditis? no  Has patient had joint replacement within last 12 months?  no  Does patient tend to be constipated or take laxatives? no  Is patient on Coumadin, Plavix and/or Aspirin? yes  Medications: see epic  Allergies: see epic  Medication Adjustment: asa 2 days  Procedure date & time: 07/24/14 at 210

## 2014-07-21 NOTE — Telephone Encounter (Signed)
agree

## 2014-07-24 ENCOUNTER — Encounter (HOSPITAL_COMMUNITY): Payer: Self-pay | Admitting: *Deleted

## 2014-07-24 ENCOUNTER — Ambulatory Visit (HOSPITAL_COMMUNITY)
Admission: RE | Admit: 2014-07-24 | Discharge: 2014-07-24 | Disposition: A | Discharge status: Home / Self Care | Payer: BC Managed Care – PPO | Source: Ambulatory Visit | Attending: Internal Medicine | Admitting: Internal Medicine

## 2014-07-24 ENCOUNTER — Encounter (HOSPITAL_COMMUNITY)
Admission: RE | Disposition: A | Discharge status: Home / Self Care | Payer: Self-pay | Source: Ambulatory Visit | Attending: Internal Medicine

## 2014-07-24 DIAGNOSIS — K2210 Ulcer of esophagus without bleeding: Secondary | ICD-10-CM | POA: Diagnosis not present

## 2014-07-24 DIAGNOSIS — K219 Gastro-esophageal reflux disease without esophagitis: Secondary | ICD-10-CM

## 2014-07-24 DIAGNOSIS — K2270 Barrett's esophagus without dysplasia: Secondary | ICD-10-CM | POA: Diagnosis not present

## 2014-07-24 DIAGNOSIS — R1013 Epigastric pain: Secondary | ICD-10-CM

## 2014-07-24 DIAGNOSIS — I2510 Atherosclerotic heart disease of native coronary artery without angina pectoris: Secondary | ICD-10-CM | POA: Diagnosis not present

## 2014-07-24 DIAGNOSIS — Z955 Presence of coronary angioplasty implant and graft: Secondary | ICD-10-CM | POA: Insufficient documentation

## 2014-07-24 DIAGNOSIS — Z79899 Other long term (current) drug therapy: Secondary | ICD-10-CM | POA: Insufficient documentation

## 2014-07-24 DIAGNOSIS — K30 Functional dyspepsia: Secondary | ICD-10-CM

## 2014-07-24 DIAGNOSIS — Z87891 Personal history of nicotine dependence: Secondary | ICD-10-CM | POA: Diagnosis not present

## 2014-07-24 DIAGNOSIS — Z7982 Long term (current) use of aspirin: Secondary | ICD-10-CM | POA: Insufficient documentation

## 2014-07-24 DIAGNOSIS — R1310 Dysphagia, unspecified: Secondary | ICD-10-CM | POA: Insufficient documentation

## 2014-07-24 DIAGNOSIS — K210 Gastro-esophageal reflux disease with esophagitis: Secondary | ICD-10-CM

## 2014-07-24 DIAGNOSIS — K449 Diaphragmatic hernia without obstruction or gangrene: Secondary | ICD-10-CM | POA: Insufficient documentation

## 2014-07-24 DIAGNOSIS — E785 Hyperlipidemia, unspecified: Secondary | ICD-10-CM | POA: Diagnosis not present

## 2014-07-24 HISTORY — PX: MALONEY DILATION: SHX5535

## 2014-07-24 HISTORY — PX: ESOPHAGOGASTRODUODENOSCOPY: SHX5428

## 2014-07-24 SURGERY — EGD (ESOPHAGOGASTRODUODENOSCOPY)
Anesthesia: Moderate Sedation

## 2014-07-24 MED ORDER — MIDAZOLAM HCL 5 MG/5ML IJ SOLN
INTRAMUSCULAR | Status: AC
  Filled 2014-07-24: qty 10

## 2014-07-24 MED ORDER — MIDAZOLAM HCL 5 MG/5ML IJ SOLN
INTRAMUSCULAR | Status: DC | PRN
  Administered 2014-07-24 (×2): 2 mg via INTRAVENOUS
  Administered 2014-07-24 (×2): 3 mg via INTRAVENOUS

## 2014-07-24 MED ORDER — MEPERIDINE HCL 50 MG/ML IJ SOLN
INTRAMUSCULAR | Status: AC
  Filled 2014-07-24: qty 1

## 2014-07-24 MED ORDER — STERILE WATER FOR IRRIGATION IR SOLN
Status: DC | PRN
  Administered 2014-07-24: 14:00:00

## 2014-07-24 MED ORDER — MEPERIDINE HCL 50 MG/ML IJ SOLN
INTRAMUSCULAR | Status: DC | PRN
  Administered 2014-07-24 (×2): 25 mg via INTRAVENOUS

## 2014-07-24 MED ORDER — PROMETHAZINE HCL 25 MG/ML IJ SOLN
INTRAMUSCULAR | Status: AC
  Filled 2014-07-24: qty 1

## 2014-07-24 MED ORDER — BUTAMBEN-TETRACAINE-BENZOCAINE 2-2-14 % EX AERO
INHALATION_SPRAY | CUTANEOUS | Status: DC | PRN
  Administered 2014-07-24: 2 via TOPICAL

## 2014-07-24 MED ORDER — SODIUM CHLORIDE 0.9 % IV SOLN
INTRAVENOUS | Status: DC
  Administered 2014-07-24: 14:00:00 via INTRAVENOUS

## 2014-07-24 MED ORDER — ESOMEPRAZOLE MAGNESIUM 40 MG PO CPDR: 40.0000 mg | DELAYED_RELEASE_CAPSULE | Freq: Every day | ORAL | Status: DC

## 2014-07-24 NOTE — Op Note (Signed)
EGD PROCEDURE REPORT  PATIENT:  [name]  MR#:  [id number] Birthdate:  May 10, 1951, 63 y.o., male Endoscopist:  Dr. Rogene Houston, MD Referred By:  Dr. Glenda Chroman, MD Procedure Date: 07/24/2014  Procedure:   EGD with ED  Indications:  Patient is 63 year old Caucasian male who was chronic GERD and pantoprazole is not working anymore. He has frequent heartburn he also has dysphagia with pills and epigastric pain. He takes a long time to finish his meal as he is afraid he will have heartburn and/or dysphagia.            Informed Consent:  The risks, benefits, alternatives & imponderables which include, but are not limited to, bleeding, infection, perforation, drug reaction and potential missed lesion have been reviewed.  The potential for biopsy, lesion removal, esophageal dilation, etc. have also been discussed.  Questions have been answered.  All parties agreeable.  Please see history & physical in medical record for more information.  Medications:  Demerol 50 mg IV Versed 10 mg IV Cetacaine spray topically for oropharyngeal anesthesia  Description of procedure:  The endoscope was introduced through the mouth and advanced to the second portion of the duodenum without difficulty or limitations. The mucosal surfaces were surveyed very carefully during advancement of the scope and upon withdrawal.  Findings:  Esophagus:  Mucosa of the esophagus was normal except distal segment where there were 2 erosions. 2 small islands of salmon colored mucosa noted with intervening squamous epithelium. No ring or stricture present. GEJ:  38 cm Hiatus:  40 cm Stomach:  Stomach was empty and distended very well with insufflation. Folds in the proximal stomach were normal. Examined of mucosa at body, antrum, pyloric channel, angularis, fundus and cardia was normal. Duodenum:  Normal bulbar and post bulbar mucosa.  Therapeutic/Diagnostic Maneuvers Performed:   Esophagus was dilated by passing 54  [place] Maloney dilator to full insertion. His aphasia mucosa was reexamined post dilation and there was no mucosal disruption. Biopsy was taken from distal esophagus.  Complications:  None  Impression: Erosive reflux esophagitis with small sliding hiatal hernia. Two small patches of salmon colored mucosa at distal esophagus suspicious for short segment Barrett's. Biopsy taken following esophageal dilation. No evidence of esophageal ring or stricture. Esophagus dilated by passing 25 [place] Maloney dilator.   Recommendations:  Anti-reflux measures reinforced. Discontinue pantoprazole. Begin Nexium 40 mg by mouth every morning. Patient advised to keep alcohol intake to no more than 2 drinks per day(he is drinking anywhere from 3-6 daily). I will be contacting patient with biopsy results. Office visit in 3 months.   Dierks Wach U  07/24/2014  2:53 PM  CC: Dr. Glenda Chroman., MD & Dr. Rayne Du ref. provider found

## 2014-07-24 NOTE — Discharge Instructions (Signed)
Discontinue pantoprazole but resume other medications as before. Nexium 40 mg by mouth 30 minutes before breakfast daily. Resume usual diet. Remember you cannot drink more than 2 drinks of alcohol a day. No driving for 24 hours. Physician will call with biopsy results. Office visit in 3 months.  Esophagogastroduodenoscopy Care After Refer to this sheet in the next few weeks. These instructions provide you with information on caring for yourself after your procedure. Your caregiver may also give you more specific instructions. Your treatment has been planned according to current medical practices, but problems sometimes occur. Call your caregiver if you have any problems or questions after your procedure.  HOME CARE INSTRUCTIONS  Do not eat or drink anything until the numbing medicine (local anesthetic) has worn off and your gag reflex has returned. You will know that the local anesthetic has worn off when you can swallow comfortably.  Do not drive for 12 hours after the procedure or as directed by your caregiver.  Only take medicines as directed by your caregiver. SEEK MEDICAL CARE IF:   You cannot stop coughing.  You are not urinating at all or less than usual. SEEK IMMEDIATE MEDICAL CARE IF:  You have difficulty swallowing.  You cannot eat or drink.  You have worsening throat or chest pain.  You have dizziness, lightheadedness, or you faint.  You have nausea or vomiting.  You have chills.  You have a fever.  You have severe abdominal pain.  You have black, tarry, or bloody stools. Document Released: 08/08/2012 Document Reviewed: 08/08/2012 [facility] Patient Information 2015 [place]. This information is not intended to replace advice given to you by your health care provider. Make sure you discuss any questions you have with your health care provider.

## 2014-07-24 NOTE — H&P (Signed)
[name] is an 63 y.o. male.   Chief Complaint:  Patient's here for EGD and possible ED. HPI:  Patient is 63 year old Caucasian male with chronic GERD who presents with recurrent heartburn epigastric pain and he also has difficulty swallowing his pills. He does not have difficulty swallowing food with he states he eats very slowly and not as nausea vomiting melena or rectal bleeding. He has lost 25 pounds this year and he believes most of the weight loss is voluntary.   Past Medical History  Diagnosis Date  . 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 Tobacco abuse     remote; also remote moderate alcohol consumption  . Hyperlipidemia     triglycerides greater than 1000  . Coronary artery disease   . Anginal pain   . Pneumonia     hx of PNA  . Low back pain 04/27/2012    Past Surgical History  Procedure Laterality Date  . Cardiac catheterization    . Coronary angioplasty with stent placement  02/17/2012     proximal LAD    History reviewed. No pertinent family history. Social History:  reports that he quit smoking about 15 years ago. His smoking use included Cigarettes. He has a 52.5 pack-year smoking history. He has never used smokeless tobacco. He reports that he does not use illicit drugs. His alcohol history is not on file.  Allergies:  Allergies  Allergen Reactions  . Crestor [Rosuvastatin] Other (See Comments)    Myalgias; also Vytorin--  PT CAN SOMEWHAT TOLERATE CRESTOR 10 mg  . Isosorbide Mononitrate [Isosorbide] Other (See Comments)    Unable to tolerate Imdur at a dose of 60 mg secondary to malaise and headache  . Vytorin [Ezetimibe-Simvastatin] Other (See Comments)    Myalgias; also rosuvastatin     Medications Prior to Admission  Medication Sig Dispense Refill  . amLODipine (NORVASC)  2.5 MG tablet Take 2.5 mg by mouth daily.    Marland Kitchen aspirin 81 MG tablet Take 81 mg by mouth daily.    . metoprolol succinate (TOPROL-XL) 50 MG 24 hr tablet TAKE ONE TABLET BY MOUTH ONCE DAILY. TAKE WITH OR IMMEDIATELY FOLLOWING A MEAL 90 tablet 3  . pantoprazole (PROTONIX) 40 MG tablet TAKE ONE TABLET BY MOUTH ONCE DAILY 90 tablet 4  . pregabalin (LYRICA) 75 MG capsule Take 75 mg by mouth 2 (two) times daily.    Marland Kitchen RANEXA 1000 MG SR tablet TAKE ONE TABLET BY MOUTH IN THE MORNING, AND TAKE ONE-HALF TABLET BY MOUTH IN THE EVENING 60 tablet 3  . hydrochlorothiazide (MICROZIDE) 12.5 MG capsule Take 1 capsule (12.5 mg total) by mouth as needed. (Patient not taking: Reported on 07/22/2014) 45 capsule 3  . nitroGLYCERIN (NITROSTAT) 0.4 MG SL tablet Place 1 tablet (0.4 mg total) under the tongue every 5 (five) minutes as needed for chest pain (up to 3 doses). 25 tablet 3  . pravastatin (PRAVACHOL) 10 MG tablet Take 1 tablet (10 mg total) by mouth daily. (Patient not taking: Reported on 07/22/2014) 30 tablet 3  . Tapentadol HCl (NUCYNTA) 75 MG TABS Take 1 tablet by mouth daily as needed (pain).      No results found for this or any previous visit (from the past 48 hour(s)). No results found.  ROS  Blood pressure 165/78, pulse 66, temperature 98.1  F (36.7 C), temperature source Oral, resp. rate 15, height 5\' 10"  (1.778 m), weight 165 lb (74.844 kg), SpO2 100 %. Physical Exam  Constitutional: He appears well-developed and well-nourished.  Eyes: Conjunctivae are normal. No scleral icterus.  Neck: No thyromegaly present.  Cardiovascular: Normal rate, regular rhythm and normal heart sounds.   No murmur heard. Respiratory: Effort normal and breath sounds normal.  GI: Soft. He exhibits no distension and no mass. There is no tenderness.  Musculoskeletal: He exhibits no edema.  Lymphadenopathy:    He has no cervical adenopathy.  Neurological: He is alert.  Skin: Skin is warm and dry.      Assessment/Plan Refractory GERD, dysphagia to pills and epigastric pain. EGD possible ED.  REHMAN,NAJEEB U 07/24/2014, 2:18 PM

## 2014-07-29 ENCOUNTER — Encounter (HOSPITAL_COMMUNITY): Payer: Self-pay | Admitting: Internal Medicine

## 2014-08-04 ENCOUNTER — Telehealth (INDEPENDENT_AMBULATORY_CARE_PROVIDER_SITE_OTHER): Payer: Self-pay | Admitting: *Deleted

## 2014-08-04 ENCOUNTER — Encounter (INDEPENDENT_AMBULATORY_CARE_PROVIDER_SITE_OTHER): Payer: Self-pay | Admitting: *Deleted

## 2014-08-04 NOTE — Telephone Encounter (Signed)
Progress Report from starting the new medicine Nexium 40 mg. [name] can not see any change at this time. He is getting over a sinus infection also. He is still having 2 to 3 episodes of indigestion. His return phone number is (H) [phone] or (C) [phone].

## 2014-08-06 NOTE — Telephone Encounter (Signed)
Per Dr.Rehman - The patient may try Dexilant 60 mg - Take 1 by mouth a day. Samples given. Dr.Rehman ask to make sure that he is limiting his alcohol to 2 drinks per day. I have spoken with patient's wife and made her aware.

## 2014-08-06 NOTE — Telephone Encounter (Signed)
Patient really isn't feeling much better.  Call Jocelyn Lamer is he decides to increase   Regardless will he  need the medication called into [facility].  Call her cell [phone]

## 2014-08-14 ENCOUNTER — Encounter (HOSPITAL_COMMUNITY): Payer: Self-pay | Admitting: Cardiovascular Disease

## 2014-08-22 ENCOUNTER — Telehealth (INDEPENDENT_AMBULATORY_CARE_PROVIDER_SITE_OTHER): Payer: Self-pay | Admitting: *Deleted

## 2014-08-22 NOTE — Telephone Encounter (Signed)
The Nexium is not working for the patient as he is having breakthrough symptoms. Samples of Dexilant was provided for the patient. This is working for him. His wife called and ask that we call in a prescription for the Dexilant 60 mg take one by mouth daily. #60 5 refills. This was called to the [facility].

## 2014-09-08 ENCOUNTER — Encounter: Payer: Self-pay | Admitting: Cardiovascular Disease

## 2014-09-09 MED ORDER — RANOLAZINE ER 1000 MG PO TB12: 1000.0000 mg | ORAL_TABLET | Freq: Two times a day (BID) | ORAL | Status: DC

## 2014-09-09 NOTE — Telephone Encounter (Signed)
-----   Message from Herminio Commons, MD sent at 09/09/2014  9:38 AM EST ----- Regarding: RE: INCREASE OF RANEXA Yes.  ----- Message -----    From: Merlene Laughter, LPN    Sent: 09/12/8414   8:32 AM      To: Herminio Commons, MD Subject: [place]                             Is it okay to increase his ranexa to 1000 twice daily per email request. Also, I am letting the patient know he is due for f/u

## 2014-10-20 ENCOUNTER — Ambulatory Visit (INDEPENDENT_AMBULATORY_CARE_PROVIDER_SITE_OTHER): Payer: BC Managed Care – PPO | Admitting: Internal Medicine

## 2014-10-22 ENCOUNTER — Telehealth (INDEPENDENT_AMBULATORY_CARE_PROVIDER_SITE_OTHER): Payer: Self-pay | Admitting: *Deleted

## 2014-10-22 NOTE — Telephone Encounter (Signed)
Asking if we have any samples of Dexilant.  Samples given   Lot# [id number]  Expires 11/2016

## 2014-11-13 ENCOUNTER — Telehealth: Payer: Self-pay | Admitting: *Deleted

## 2014-11-13 NOTE — Telephone Encounter (Signed)
Pt does not have insurance now and needs renexa samples. Pt is also sending application for renexa for [place] to fill out our part/tmj

## 2014-11-13 NOTE — Telephone Encounter (Signed)
1 month samples Ranexa given to pt, given number to [name] at [facility] since he lost insurance

## 2014-11-17 ENCOUNTER — Ambulatory Visit (INDEPENDENT_AMBULATORY_CARE_PROVIDER_SITE_OTHER): Payer: Self-pay | Admitting: Internal Medicine

## 2015-01-26 ENCOUNTER — Ambulatory Visit (INDEPENDENT_AMBULATORY_CARE_PROVIDER_SITE_OTHER): Payer: Self-pay | Admitting: Internal Medicine

## 2015-01-28 ENCOUNTER — Telehealth (INDEPENDENT_AMBULATORY_CARE_PROVIDER_SITE_OTHER): Payer: Self-pay | Admitting: *Deleted

## 2015-01-28 ENCOUNTER — Telehealth: Payer: Self-pay | Admitting: Adult Health

## 2015-01-28 NOTE — Telephone Encounter (Signed)
Samples were given per NUR.

## 2015-01-28 NOTE — Telephone Encounter (Signed)
Jocelyn Lamer said [facility] [place] stopped taking Dexilant.  He bloating was worse and he stopped and feels better.  He is taking Nexium.  She knows it is OTC but do we have some samples to help them out.    We are still waiting to see if his [facility] is [facility] so [facility]  Just let her know

## 2015-01-28 NOTE — Telephone Encounter (Signed)
Per patient's wife Denyse Amass is sending over request for change from Dexalant to Nexium / tg

## 2015-01-28 NOTE — Telephone Encounter (Signed)
Wife states pt will start going to the [place] in [place] at [place] soon. Wife states she will call GI and see if they will complete the request.

## 2015-03-31 ENCOUNTER — Telehealth: Payer: Self-pay | Admitting: Cardiovascular Disease

## 2015-03-31 NOTE — Telephone Encounter (Signed)
Vicky called inquiring about records not being sent to the [facility] per request that was scanned into [employer] on May 23rd.  Spoke with Marcie Bal at [employer] she will get with someone and call the patient in reference to why records have not been sent to [facility] yet.

## 2015-05-14 ENCOUNTER — Encounter (INDEPENDENT_AMBULATORY_CARE_PROVIDER_SITE_OTHER): Payer: Self-pay | Admitting: *Deleted

## 2017-12-11 DIAGNOSIS — R69 Illness, unspecified: Secondary | ICD-10-CM | POA: Diagnosis not present

## 2018-05-18 DIAGNOSIS — H524 Presbyopia: Secondary | ICD-10-CM | POA: Diagnosis not present

## 2018-06-17 DIAGNOSIS — R69 Illness, unspecified: Secondary | ICD-10-CM | POA: Diagnosis not present

## 2018-06-18 DIAGNOSIS — R69 Illness, unspecified: Secondary | ICD-10-CM | POA: Diagnosis not present

## 2018-06-25 DIAGNOSIS — Z0100 Encounter for examination of eyes and vision without abnormal findings: Secondary | ICD-10-CM | POA: Diagnosis not present

## 2018-07-23 ENCOUNTER — Ambulatory Visit: Payer: Self-pay | Admitting: Cardiovascular Disease

## 2018-09-04 ENCOUNTER — Encounter: Payer: Self-pay | Admitting: *Deleted

## 2018-09-04 ENCOUNTER — Ambulatory Visit: Payer: Medicare HMO | Admitting: Cardiovascular Disease

## 2018-09-04 ENCOUNTER — Encounter: Payer: Self-pay | Admitting: Cardiovascular Disease

## 2018-09-04 ENCOUNTER — Encounter

## 2018-09-04 ENCOUNTER — Telehealth: Payer: Self-pay | Admitting: Cardiovascular Disease

## 2018-09-04 VITALS — BP 134/70 | HR 66 | Ht 70.0 in | Wt 185.0 lb

## 2018-09-04 DIAGNOSIS — E785 Hyperlipidemia, unspecified: Secondary | ICD-10-CM | POA: Diagnosis not present

## 2018-09-04 DIAGNOSIS — I10 Essential (primary) hypertension: Secondary | ICD-10-CM

## 2018-09-04 DIAGNOSIS — I2510 Atherosclerotic heart disease of native coronary artery without angina pectoris: Secondary | ICD-10-CM | POA: Diagnosis not present

## 2018-09-04 DIAGNOSIS — K2100 Gastro-esophageal reflux disease with esophagitis, without bleeding: Secondary | ICD-10-CM

## 2018-09-04 DIAGNOSIS — R079 Chest pain, unspecified: Secondary | ICD-10-CM

## 2018-09-04 DIAGNOSIS — I25118 Atherosclerotic heart disease of native coronary artery with other forms of angina pectoris: Secondary | ICD-10-CM | POA: Diagnosis not present

## 2018-09-04 MED ORDER — ROSUVASTATIN CALCIUM 5 MG PO TABS: 5.0000 mg | ORAL_TABLET | Freq: Every day | ORAL | 6 refills | Status: DC

## 2018-09-04 MED ORDER — NITROGLYCERIN 0.4 MG SL SUBL: 0.4000 mg | SUBLINGUAL_TABLET | SUBLINGUAL | 3 refills | Status: DC | PRN

## 2018-09-04 NOTE — Patient Instructions (Addendum)
Medication Instructions:   Begin Crestor 5mg  daily.   Continue all other medications.    Labwork:  Lipids - due in 3 months.   Will mail reminder when time.  Testing/Procedures:  Your physician has requested that you have a lexiscan myoview. For further information please visit [email or web address]. Please follow instruction sheet, as given.  Office will contact with results via phone or letter.    Follow-Up: Your physician wants you to follow up in: 6 months.  You will receive a reminder letter in the mail one-two months in advance.  If you don't receive a letter, please call our office to schedule the follow up appointment   Any Other Special Instructions Will Be Listed Below (If Applicable).  If you need a refill on your cardiac medications before your next appointment, please call your pharmacy.

## 2018-09-04 NOTE — Addendum Note (Signed)
Addended by: Laurine Blazer on: 09/04/2018 11:19 AM   Modules accepted: Orders

## 2018-09-04 NOTE — Progress Notes (Signed)
CARDIOLOGY CONSULT NOTE  Patient ID: [name] MRN: [id number] DOB/AGE: Feb 21, 1951 67 y.o.  Admit date: (Not on file) Primary Physician: Glenda Chroman, MD Referring Physician: Glenda Chroman, MD  Reason for Consultation: Coronary artery disease  HPI: [name] is a 67 y.o. male who is being seen today for the evaluation of coronary artery disease at the request of Vyas, Dhruv B, MD.   I last saw him in May 2015.  He was supposed to follow-up 6 months later but did not.  He has a history of statin intolerance and intolerance to nitrates. He has a history of prior percutaneous coronary intervention.  ECG performed in the office today which I ordered and personally interpreted demonstrates normal sinus rhythm with no ischemic ST segment or T-wave abnormalities, nor any arrhythmias.  He is here with his wife.  She has brought in an extensive stack of records from the [place] where he has been receiving his cardiac care.  I reviewed the cardiac catheterization report dated 08/21/2015.  He had 95% in-stent restenosis of a mid LAD stent and a new proximal 99% LAD stenosis.  He underwent drug-eluting stent placement x2 to the proximal and mid LAD.  The proximal LAD has a Xience Alpine 2.75 mm x 18 mm stent.  The mid LAD has a Xience Alpine 2.5 mm x 18 mm stent.  The proximal left circumflex had a 30% stenosis and the remaining vessels including the RCA had luminal irregularities.  I reviewed labs dated 12/27/2017: Sodium 137, potassium 4.4, BUN 18, creatinine 0.86, white blood cells 4.46, hemoglobin 14.9, platelets 115.  Lipid panel on 12/27/2017 showed total cholesterol 209, triglycerides 158, LDL 122, HDL 55.  Upon review of office notes, it appears he was able to tolerate lower doses of Crestor on a daily basis.  He also has a hiatal hernia and Barrett's esophagus and is intolerant of both H2 blockers and proton pump inhibitors as they both lead to bloating.  He has  significant chest pain primarily when he lies down at night at roughly 3 AM.  He has abdominal bloating and distention at this time.  He walks in a warehouse and typically denies exertional chest pain and dyspnea.  I reviewed an echocardiogram dated 05/01/2015 which demonstrated normal left ventricular systolic function, mild LVH, and mild diastolic dysfunction.  I did not find an LVEF.  He underwent a stress test on 10/09/2015 which was nondiagnostic due to inadequate heart rate.  Chest pain is relieved with nitroglycerin.    Allergies  Allergen Reactions  . Crestor [Rosuvastatin] Other (See Comments)    Myalgias; also Vytorin--  PT CAN SOMEWHAT TOLERATE CRESTOR 10 mg  . Isosorbide Mononitrate [Isosorbide Nitrate] Other (See Comments)    Unable to tolerate Imdur at a dose of 60 mg secondary to malaise and headache  . Vytorin [Ezetimibe-Simvastatin] Other (See Comments)    Myalgias; also rosuvastatin     Current Outpatient Medications  Medication Sig Dispense Refill  . aspirin 81 MG tablet Take 81 mg by mouth daily.    . Coenzyme Q10 (CO Q 10 PO) Take by mouth.    . esomeprazole (NEXIUM) 40 MG capsule Take 1 capsule (40 mg total) by mouth daily before breakfast. 30 capsule 11  . metoprolol succinate (TOPROL-XL) 50 MG 24 hr tablet TAKE ONE TABLET BY MOUTH ONCE DAILY. TAKE WITH OR IMMEDIATELY FOLLOWING A MEAL 90 tablet 3  . nitroGLYCERIN (NITROSTAT) 0.4 MG SL tablet  Place 1 tablet (0.4 mg total) under the tongue every 5 (five) minutes as needed for chest pain (up to 3 doses). 25 tablet 3  . ranolazine (RANEXA) 1000 MG SR tablet Take 1 tablet (1,000 mg total) by mouth 2 (two) times daily. 60 tablet 3   No current facility-administered medications for this visit.     Past Medical History:  Diagnosis Date  . Anginal pain ([place])   . 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 Coronary artery disease   . Hyperlipidemia    triglycerides greater than 1000  . Low back pain 04/27/2012  . Pneumonia    hx of PNA  . Tobacco abuse    remote; also remote moderate alcohol consumption    Past Surgical History:  Procedure Laterality Date  . CARDIAC CATHETERIZATION    . CORONARY ANGIOPLASTY WITH STENT PLACEMENT  02/17/2012    proximal LAD  . ESOPHAGOGASTRODUODENOSCOPY N/A 07/24/2014   Procedure: ESOPHAGOGASTRODUODENOSCOPY (EGD);  Surgeon: Rogene Houston, MD;  [facility];  Service: Endoscopy;  Laterality: N/A;  210  . MALONEY DILATION N/A 07/24/2014   Procedure: Venia Minks DILATION;  Surgeon: Rogene Houston, MD;  [facility];  Service: Endoscopy;  Laterality: N/A;  . PERCUTANEOUS CORONARY STENT INTERVENTION (PCI-S) N/A 02/17/2012   Procedure: PERCUTANEOUS CORONARY STENT INTERVENTION (PCI-S);  Surgeon: Burnell Blanks, MD;  Location: [facility] CATH LAB;  Service: Cardiovascular;  Laterality: N/A;    Social History   Socioeconomic History  . Marital status: Married    Spouse name: Not on file  . Number of children: Not on file  . Years of education: Not on file  . Highest education level: Not on file  Occupational History  . Not on file  Social Needs  . Financial resource strain: Not on file  . Food insecurity:    Worry: Not on file    Inability: Not on file  . Transportation needs:    Medical: Not on file    Non-medical: Not on file  Tobacco Use  . Smoking status: Former Smoker    Packs/day: 1.50    Years: 35.00    Pack years: 52.50    Types: Cigarettes    Last attempt to quit: 09/05/1998    Years since quitting: 20.0  . Smokeless tobacco: Never Used  Substance and Sexual Activity  . Alcohol use: Not on file    Comment: 6-8 beers daily   . Drug use: No  . Sexual activity: Not on file  Lifestyle  . Physical activity:    Days per week: Not on file    Minutes per  session: Not on file  . Stress: Not on file  Relationships  . Social connections:    Talks on phone: Not on file    Gets together: Not on file    Attends religious service: Not on file    Active member of club or organization: Not on file    Attends meetings of clubs or organizations: Not on file    Relationship status: Not on file  . Intimate partner violence:    Fear of current or ex partner: Not on file    Emotionally abused: Not on file    Physically abused: Not on file    Forced sexual activity: Not on file  Other Topics Concern  .  Not on file  Social History Narrative  . Not on file     No family history of premature CAD in 1st degree relatives.  Current Meds  Medication Sig  . aspirin 81 MG tablet Take 81 mg by mouth daily.  . Coenzyme Q10 (CO Q 10 PO) Take by mouth.  . esomeprazole (NEXIUM) 40 MG capsule Take 1 capsule (40 mg total) by mouth daily before breakfast.  . metoprolol succinate (TOPROL-XL) 50 MG 24 hr tablet TAKE ONE TABLET BY MOUTH ONCE DAILY. TAKE WITH OR IMMEDIATELY FOLLOWING A MEAL  . nitroGLYCERIN (NITROSTAT) 0.4 MG SL tablet Place 1 tablet (0.4 mg total) under the tongue every 5 (five) minutes as needed for chest pain (up to 3 doses).  . ranolazine (RANEXA) 1000 MG SR tablet Take 1 tablet (1,000 mg total) by mouth 2 (two) times daily.      Review of systems complete and found to be negative unless listed above in HPI    Physical exam Blood pressure 134/70, pulse 66, height 5\' 10"  (1.778 m), weight 165 lb (74.8 kg), SpO2 99 %. General: NAD Neck: No JVD, no thyromegaly or thyroid nodule.  Lungs: Clear to auscultation bilaterally with normal respiratory effort. CV: Nondisplaced PMI. Regular rate and rhythm, normal S1/S2, no S3/S4, no murmur.  No peripheral edema.  No carotid bruit.    Abdomen: Soft, nontender, no distention.  Skin: Intact without lesions or rashes.  Neurologic: Alert and oriented x 3.  Psych: Normal affect. Extremities: No  clubbing or cyanosis.  HEENT: Normal.   ECG: Most recent ECG reviewed.   Labs: Lab Results  Component Value Date/Time   K 4.5 09/11/2013 04:02 PM   BUN 13 09/11/2013 04:02 PM   CREATININE 1.03 09/11/2013 04:02 PM   ALT 27 01/30/2013 01:11 PM   HGB 13.5 03/21/2012 12:41 PM     Lipids: Lab Results  Component Value Date/Time   LDLCALC 87 01/30/2013 01:11 PM   LDLDIRECT 120 (H) 01/03/2013 08:45 AM   CHOL 185 01/30/2013 01:11 PM   TRIG 261 (H) 01/30/2013 01:11 PM   HDL 46 01/30/2013 01:11 PM        ASSESSMENT AND PLAN:  1.  Coronary artery disease: Currently on aspirin, metoprolol succinate, and ranolazine.  He has chest pain which sounds most typical for a gastrointestinal etiology.  However he has had previous symptom overlap which required drug-eluting stent placement.  I will obtain a Lexiscan Myoview for further clarification.  I will also attempt Crestor 5 mg daily.  2.  Hypertension: Blood pressure is normal.  No changes to therapy.  3.  Hyperlipidemia: He has been statin intolerant.  LDL elevated at 122 on 12/27/2017 with full lipid panel reviewed above.  It appears he has been intolerant of low-dose statins.  I will try Crestor 5 mg daily.  I will repeat lipids in 3 months.  4.  Abdominal pain/GERD: He is scheduled to see GI in March 2020.  He appears to be intolerant of both proton pump inhibitors and H2 blockers.     Disposition: Follow up in 6 months  Signed: Kate Sable, M.D., F.A.C.C.  09/04/2018, 10:33 AM

## 2018-09-04 NOTE — Telephone Encounter (Signed)
°  Precert needed for: lexiscan - chest pain   Location: [facility]     Date: Sep 18, 2018

## 2018-09-11 ENCOUNTER — Telehealth: Payer: Self-pay | Admitting: Cardiovascular Disease

## 2018-09-11 MED ORDER — ROSUVASTATIN CALCIUM 5 MG PO TABS: 5.0000 mg | ORAL_TABLET | ORAL | 6 refills | Status: DC

## 2018-09-11 NOTE — Telephone Encounter (Signed)
Patient c/o that crestor causes him pain in his legs. Patient confirmed that he has been taking it daily. Patient advised to take the crestor every other day and call us back with an update. Verbalized understanding.

## 2018-09-11 NOTE — Telephone Encounter (Signed)
Patient called in regards to a new medication he has started. States that he is uncertain if it is going to agree with him.

## 2018-09-18 ENCOUNTER — Ambulatory Visit (HOSPITAL_COMMUNITY)
Admission: RE | Admit: 2018-09-18 | Discharge: 2018-09-18 | Disposition: A | Discharge status: Home / Self Care | Payer: No Typology Code available for payment source | Source: Ambulatory Visit | Attending: Cardiovascular Disease | Admitting: Cardiovascular Disease

## 2018-09-18 ENCOUNTER — Telehealth: Payer: Self-pay | Admitting: *Deleted

## 2018-09-18 ENCOUNTER — Encounter (HOSPITAL_COMMUNITY)
Admission: RE | Admit: 2018-09-18 | Discharge: 2018-09-18 | Disposition: A | Discharge status: Home / Self Care | Payer: No Typology Code available for payment source | Source: Ambulatory Visit | Attending: Cardiovascular Disease | Admitting: Cardiovascular Disease

## 2018-09-18 ENCOUNTER — Encounter (HOSPITAL_COMMUNITY): Payer: Self-pay

## 2018-09-18 DIAGNOSIS — R079 Chest pain, unspecified: Secondary | ICD-10-CM | POA: Insufficient documentation

## 2018-09-18 LAB — NM MYOCAR MULTI W/SPECT W/WALL MOTION / EF
CHL CUP NUCLEAR SRS: 9
CHL CUP RESTING HR STRESS: 57 {beats}/min
LHR: 0.37
LV dias vol: 104 mL (ref 62–150)
LV sys vol: 45 mL
NUC STRESS TID: 1.27
Peak HR: 86 {beats}/min
SDS: 5
SSS: 14

## 2018-09-18 MED ORDER — REGADENOSON 0.4 MG/5ML IV SOLN
INTRAVENOUS | Status: AC
  Administered 2018-09-18: 0.4 mg via INTRAVENOUS
  Filled 2018-09-18: qty 5

## 2018-09-18 MED ORDER — TECHNETIUM TC 99M TETROFOSMIN IV KIT
30.0000 | PACK | Freq: Once | INTRAVENOUS | Status: AC | PRN
  Administered 2018-09-18: 28.9 via INTRAVENOUS

## 2018-09-18 MED ORDER — AMINOPHYLLINE 25 MG/ML IV SOLN
INTRAVENOUS | Status: AC
  Administered 2018-09-18: 25 mg via INTRAVENOUS
  Filled 2018-09-18: qty 10

## 2018-09-18 MED ORDER — TECHNETIUM TC 99M TETROFOSMIN IV KIT
10.0000 | PACK | Freq: Once | INTRAVENOUS | Status: AC | PRN
  Administered 2018-09-18: 10.2 via INTRAVENOUS

## 2018-09-18 MED ORDER — AMINOPHYLLINE 25 MG/ML IV (NUC MED)
75.0000 mg | Freq: Once | INTRAVENOUS | Status: AC
  Administered 2018-09-18: 25 mg via INTRAVENOUS
  Filled 2018-09-18: qty 3

## 2018-09-18 MED ORDER — SODIUM CHLORIDE 0.9% FLUSH
INTRAVENOUS | Status: AC
  Administered 2018-09-18: 10 mL via INTRAVENOUS
  Filled 2018-09-18: qty 10

## 2018-09-18 NOTE — Telephone Encounter (Signed)
-----   Message from Herminio Commons, MD sent at 09/18/2018  3:42 PM EST ----- Suggestive of blockages.  Continue current medical therapy.  Please move up follow-up office visit to discuss next steps (within next 6-8 weeks).

## 2018-09-18 NOTE — Telephone Encounter (Signed)
Patient informed. Copy sent to PCP °

## 2018-09-24 ENCOUNTER — Ambulatory Visit: Payer: Managed Care, Other (non HMO) | Admitting: Cardiovascular Disease

## 2018-11-02 ENCOUNTER — Ambulatory Visit (INDEPENDENT_AMBULATORY_CARE_PROVIDER_SITE_OTHER): Payer: No Typology Code available for payment source | Admitting: Cardiovascular Disease

## 2018-11-02 ENCOUNTER — Encounter: Payer: Self-pay | Admitting: Cardiovascular Disease

## 2018-11-02 VITALS — BP 126/62 | HR 67 | Ht 70.0 in | Wt 178.0 lb

## 2018-11-02 DIAGNOSIS — Z955 Presence of coronary angioplasty implant and graft: Secondary | ICD-10-CM

## 2018-11-02 DIAGNOSIS — I10 Essential (primary) hypertension: Secondary | ICD-10-CM | POA: Diagnosis not present

## 2018-11-02 DIAGNOSIS — K2100 Gastro-esophageal reflux disease with esophagitis, without bleeding: Secondary | ICD-10-CM

## 2018-11-02 DIAGNOSIS — I25118 Atherosclerotic heart disease of native coronary artery with other forms of angina pectoris: Secondary | ICD-10-CM

## 2018-11-02 DIAGNOSIS — E785 Hyperlipidemia, unspecified: Secondary | ICD-10-CM

## 2018-11-02 MED ORDER — ROSUVASTATIN CALCIUM 10 MG PO TABS: 10.0000 mg | ORAL_TABLET | Freq: Every day | ORAL | 3 refills | Status: DC

## 2018-11-02 NOTE — Progress Notes (Signed)
SUBJECTIVE: The patient returns for follow-up after undergoing cardiovascular testing performed for the evaluation of chest pain.  Nuclear stress test was abnormal.  Horizontal ST segment depression was noted in leads II, aVF, V5, and V6 of roughly 0.5 mm.  There was a moderate size defect in the mid anteroseptal, mid inferoseptal, apical anterior, apical septal and apical inferior walls consistent with prior MI with a moderate degree of peri-infarct ischemia.  It was deemed an intermediate risk study.  LVEF 57%.  He is here with his wife.  He said this is the best he has felt in a very long time.  He has lost about 10 pounds on his home scales.  He is completely changed his diet and keeps the head of the bed elevated and he denies chest pain and shortness of breath.  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  I reviewed labs dated 10/26/2018: TSH 1.24, A1c 4.9%, triglycerides 197, LDL 120, BUN 13.  He initially tried taking Crestor 5 mg daily and called our office stating that he had myalgias from this.  I then instructed him to take it every other day.  After short while he resumed taking it daily and now asked if he can be increased to 10 mg daily.    Review of Systems: As per "subjective", otherwise negative.  Allergies  Allergen Reactions  . Crestor [Rosuvastatin] Other (See Comments)    Myalgias; also Vytorin--  PT CAN SOMEWHAT TOLERATE CRESTOR 10 mg  . Isosorbide Mononitrate [Isosorbide Nitrate] Other (See Comments)    Unable to tolerate Imdur at a dose of 60 mg secondary to malaise and headache  . Vytorin [Ezetimibe-Simvastatin] Other (See Comments)    Myalgias; also rosuvastatin     Current Outpatient Medications  Medication Sig Dispense Refill  . aspirin 81 MG tablet Take 81 mg by mouth daily.    . Coenzyme Q10 (CO Q 10 PO) Take by mouth.    . esomeprazole (NEXIUM) 40 MG capsule Take 1 capsule (40 mg total)  by mouth daily before breakfast. 30 capsule 11  . metoprolol succinate (TOPROL-XL) 50 MG 24 hr tablet TAKE ONE TABLET BY MOUTH ONCE DAILY. TAKE WITH OR IMMEDIATELY FOLLOWING A MEAL 90 tablet 3  . nitroGLYCERIN (NITROSTAT) 0.4 MG SL tablet Place 1 tablet (0.4 mg total) under the tongue every 5 (five) minutes as needed for chest pain (up to 3 doses). 25 tablet 3  . ranolazine (RANEXA) 1000 MG SR tablet Take 1 tablet (1,000 mg total) by mouth 2 (two) times daily. 60 tablet 3  . rosuvastatin (CRESTOR) 5 MG tablet Take 1 tablet (5 mg total) by mouth every other day. 30 tablet 6   No current facility-administered medications for this visit.     Past Medical History:  Diagnosis Date  . Anginal pain ([place])   . 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 Coronary artery disease   . Hyperlipidemia    triglycerides greater than 1000  . Low back pain 04/27/2012  . Pneumonia    hx of PNA  . Tobacco abuse    remote; also remote moderate alcohol consumption    Past Surgical History:  Procedure Laterality Date  . CARDIAC CATHETERIZATION    . CORONARY ANGIOPLASTY  WITH STENT PLACEMENT  02/17/2012    proximal LAD  . ESOPHAGOGASTRODUODENOSCOPY N/A 07/24/2014   Procedure: ESOPHAGOGASTRODUODENOSCOPY (EGD);  Surgeon: Rogene Houston, MD;  [facility];  Service: Endoscopy;  Laterality: N/A;  210  . MALONEY DILATION N/A 07/24/2014   Procedure: Venia Minks DILATION;  Surgeon: Rogene Houston, MD;  [facility];  Service: Endoscopy;  Laterality: N/A;  . PERCUTANEOUS CORONARY STENT INTERVENTION (PCI-S) N/A 02/17/2012   Procedure: PERCUTANEOUS CORONARY STENT INTERVENTION (PCI-S);  Surgeon: Burnell Blanks, MD;  Location: [facility] CATH LAB;  Service: Cardiovascular;  Laterality: N/A;    Social History   Socioeconomic  History  . Marital status: Married    Spouse name: Not on file  . Number of children: Not on file  . Years of education: Not on file  . Highest education level: Not on file  Occupational History  . Not on file  Social Needs  . Financial resource strain: Not on file  . Food insecurity:    Worry: Not on file    Inability: Not on file  . Transportation needs:    Medical: Not on file    Non-medical: Not on file  Tobacco Use  . Smoking status: Former Smoker    Packs/day: 1.50    Years: 35.00    Pack years: 52.50    Types: Cigarettes    Last attempt to quit: 09/05/1998    Years since quitting: 20.1  . Smokeless tobacco: Never Used  Substance and Sexual Activity  . Alcohol use: Not on file    Comment: 6-8 beers daily   . Drug use: No  . Sexual activity: Not on file  Lifestyle  . Physical activity:    Days per week: Not on file    Minutes per session: Not on file  . Stress: Not on file  Relationships  . Social connections:    Talks on phone: Not on file    Gets together: Not on file    Attends religious service: Not on file    Active member of club or organization: Not on file    Attends meetings of clubs or organizations: Not on file    Relationship status: Not on file  . Intimate partner violence:    Fear of current or ex partner: Not on file    Emotionally abused: Not on file    Physically abused: Not on file    Forced sexual activity: Not on file  Other Topics Concern  . Not on file  Social History Narrative  . Not on file     Vitals:   11/02/18 1034  BP: 126/62  Pulse: 67  SpO2: 98%  Weight: 178 lb (80.7 kg)  Height: 5\' 10"  (1.778 m)    Wt Readings from Last 3 Encounters:  11/02/18 178 lb (80.7 kg)  09/04/18 185 lb (83.9 kg)  07/24/14 165 lb (74.8 kg)     PHYSICAL EXAM General: NAD HEENT: Normal. Neck: No JVD, no thyromegaly. Lungs: Clear to auscultation bilaterally with normal respiratory effort. CV: Regular rate and rhythm, normal S1/S2, no  S3/S4, no murmur. No pretibial or periankle edema.  No carotid bruit.   Abdomen: Soft, nontender, no distention.  Neurologic: Alert and oriented.  Psych: Normal affect. Skin: Normal. Musculoskeletal: No gross deformities.    ECG: Reviewed above under Subjective   Labs: Lab Results  Component Value Date/Time   K 4.5 09/11/2013 04:02 PM   BUN 13 09/11/2013 04:02 PM   CREATININE 1.03 09/11/2013  04:02 PM   ALT 27 01/30/2013 01:11 PM   HGB 13.5 03/21/2012 12:41 PM     Lipids: Lab Results  Component Value Date/Time   LDLCALC 87 01/30/2013 01:11 PM   LDLDIRECT 120 (H) 01/03/2013 08:45 AM   CHOL 185 01/30/2013 01:11 PM   TRIG 261 (H) 01/30/2013 01:11 PM   HDL 46 01/30/2013 01:11 PM      Cardiac catheterization report dated 08/21/2015:  95% in-stent restenosis of a mid LAD stent and a new proximal 99% LAD stenosis.  s/p drug-eluting stent placement x2 to the proximal and mid LAD.  The proximal LAD has a Xience Alpine 2.75 mm x 18 mm stent.  The mid LAD has a Xience Alpine 2.5 mm x 18 mm stent.  The proximal left circumflex had a 30% stenosis and the remaining vessels including the RCA had luminal irregularities.  ASSESSMENT AND PLAN:  1.  Coronary artery disease: Symptomatically improved without chest pain or shortness of breath.  Currently on aspirin, metoprolol succinate, and ranolazine.    Prior episodes of chest pain more more suggestive of a gastrointestinal etiology.  However he has had previous symptom overlap which required drug-eluting stent placement.  Nuclear stress test was abnormal.  Horizontal ST segment depression was noted in leads II, aVF, V5, and V6 of roughly 0.5 mm.  There was a moderate size defect in the mid anteroseptal, mid inferoseptal, apical anterior, apical septal and apical inferior walls consistent with prior MI with a moderate degree of peri-infarct ischemia.  It was deemed an intermediate risk study.  LVEF 57%. He is tolerating Crestor 5 mg daily and I  will increase this to 10 mg daily.  2.  Hypertension: Blood pressure is normal.  No changes to therapy.  3.  Hyperlipidemia: He has been statin intolerant.  LDL now 120. He initially tried taking Crestor 5 mg daily and called our office stating that he had myalgias from this.  I then instructed him to take it every other day.  After short while he resumed taking it daily and now asked if he can be increased to 10 mg daily. I will increase to 10 mg daily.  I told him if he cannot tolerate this to try taking 10 mg and 5 mg on alternate days and to inform me if he does so.  4.  Abdominal pain/GERD: He is scheduled to see GI in March 2020.  He appears to be intolerant of both proton pump inhibitors and H2 blockers.   Disposition: Follow up 3 months   Kate Sable, M.D., F.A.C.C.

## 2018-11-02 NOTE — Patient Instructions (Signed)
Medication Instructions:  Your physician has recommended you make the following change in your medication:  Increase Crestor to 10 mg   If you need a refill on your cardiac medications before your next appointment, please call your pharmacy.   Lab work: NONE  If you have labs (blood work) drawn today and your tests are completely normal, you will receive your results only by: Marland Kitchen MyChart Message (if you have MyChart) OR . A paper copy in the mail If you have any lab test that is abnormal or we need to change your treatment, we will call you to review the results.  Testing/Procedures: NONE   Follow-Up: At [facility], you and your health needs are our priority.  As part of our continuing mission to provide you with exceptional heart care, we have created designated Provider Care Teams.  These Care Teams include your primary Cardiologist (physician) and Advanced Practice Providers (APPs -  Physician Assistants and Nurse Practitioners) who all work together to provide you with the care you need, when you need it. You will need a follow up appointment in 3 months.  Please call our office 2 months in advance to schedule this appointment.  You may see Kate Sable, MD or one of the following Advanced Practice Providers on your designated Care Team:   Bernerd Pho, PA-C [facility]) . Ermalinda Barrios, PA-C ([place])  Any Other Special Instructions Will Be Listed Below (If Applicable). Thank you for choosing [place]!

## 2018-11-09 ENCOUNTER — Ambulatory Visit: Payer: Managed Care, Other (non HMO) | Admitting: Cardiovascular Disease

## 2018-11-27 ENCOUNTER — Ambulatory Visit (INDEPENDENT_AMBULATORY_CARE_PROVIDER_SITE_OTHER): Payer: Non-veteran care | Admitting: Internal Medicine

## 2018-12-03 ENCOUNTER — Ambulatory Visit (INDEPENDENT_AMBULATORY_CARE_PROVIDER_SITE_OTHER): Payer: Non-veteran care | Admitting: Internal Medicine

## 2018-12-31 DIAGNOSIS — R69 Illness, unspecified: Secondary | ICD-10-CM | POA: Diagnosis not present

## 2019-02-01 ENCOUNTER — Ambulatory Visit: Payer: Medicare HMO | Admitting: Cardiovascular Disease

## 2019-03-12 ENCOUNTER — Ambulatory Visit (INDEPENDENT_AMBULATORY_CARE_PROVIDER_SITE_OTHER): Payer: No Typology Code available for payment source | Admitting: Internal Medicine

## 2019-03-12 ENCOUNTER — Other Ambulatory Visit: Payer: Self-pay

## 2019-03-12 ENCOUNTER — Encounter (INDEPENDENT_AMBULATORY_CARE_PROVIDER_SITE_OTHER): Payer: Self-pay | Admitting: Internal Medicine

## 2019-03-12 DIAGNOSIS — K2270 Barrett's esophagus without dysplasia: Secondary | ICD-10-CM | POA: Diagnosis not present

## 2019-03-12 DIAGNOSIS — R1013 Epigastric pain: Secondary | ICD-10-CM

## 2019-03-12 DIAGNOSIS — K219 Gastro-esophageal reflux disease without esophagitis: Secondary | ICD-10-CM | POA: Diagnosis not present

## 2019-03-12 MED ORDER — FAMOTIDINE 40 MG PO TABS: 40.0000 mg | ORAL_TABLET | Freq: Every day | ORAL | 1 refills | Status: DC

## 2019-03-12 NOTE — Progress Notes (Signed)
Reason for consultation  Epigastric pain and worsening GERD symptoms.  History of present illness  Patient is 68 year old Caucasian male who is referred through courtesy of Ms. Maggie Louellen Molder, PA of [place] for GI evaluation. Patient has a history of erosive reflux esophagitis and was lost seen on 07/24/2014 when he underwent EGD revealing erosive reflux esophagitis and small sliding hiatal hernia.  GE junction was very suspicious of Barrett's esophagus and biopsy confirmed this diagnosis.  His esophagus was also dilated at the time because he was complaining of dysphagia.  He was treated with dexlansoprazole at the time and it seemed to help.  Patient was not able to return for follow-up visit.  Patient is accompanied by his wife today. Patient states that he used to experience daily chest pain and you also would wake up at 2 AM with chest pain and regurgitation.  He has been on various PPIs.  He states since he stopped the PPI he is not having daily chest pain.  In the meantime he also has been using wedge pillow and change his eating habit.  Now he may have 1 or 2 episodes per month.  He was on ranitidine at and was recalled.  He has not tried famotidine or Pepcid.  He has heartburn few times in a month usually after breakfast. He also complains of postprandial epigastric pain and bloating which is quite distressing to him.  He feels if he has anginal pain.  This also occurs when he plays golf.  He has a history of coronary artery disease with prior stenting.  He is under care of Dr. Bronson Ing. He denies nausea vomiting melena or rectal bleeding.  His bowels usually move daily.  He has taken MiraLAX occasionally.  He takes Maalox and/or Mylanta no more than once a months. His appetite is good.  He states he has gained 5 pounds over the last few months.  He had a EGD and colonoscopy by Dr. Essie Christine in June 2018. EGD revealed moderate esophagitis with Barrett's without dysplasia  as well as mild chronic gastritis and duodenitis.  He had small sliding hiatal hernia and fundic gland polyp. Biopsy results are not available. Colonoscopy was negative for polyps.  He has a history of colonic adenomas.  He was advised to undergo repeat colonoscopy in 5 years which would be June 2023.  He had upper GI series on 02/06/2018 and it was within normal limits.    Current Medications: Outpatient Encounter Medications as of 03/12/2019  Medication Sig  . aspirin 81 MG tablet Take 81 mg by mouth daily.  . cholecalciferol (VITAMIN D3) 25 MCG (1000 UT) tablet Take 50,000 Units by mouth once a week.  . Coenzyme Q10 (CO Q 10 PO) Take by mouth 2 (two) times a day.   . Cyanocobalamin (VITAMIN B 12 PO) Take 2,500 mcg by mouth daily.  . Garlic 0623 MG TBEC Take by mouth 2 (two) times a day.  . Magnesium 200 MG TABS Take by mouth daily.  . metoprolol succinate (TOPROL-XL) 50 MG 24 hr tablet TAKE ONE TABLET BY MOUTH ONCE DAILY. TAKE WITH OR IMMEDIATELY FOLLOWING A MEAL  . milk thistle 175 MG tablet Take 175 mg by mouth daily.  . nitroGLYCERIN (NITROSTAT) 0.4 MG SL tablet Place 1 tablet (0.4 mg total) under the tongue every 5 (five) minutes as needed for chest pain (up to 3 doses).  . Omega-3 Fatty Acids (FISH OIL) 1000 MG CAPS Take by mouth 2 (two) times a  day.  Marland Kitchen OVER THE COUNTER MEDICATION daily. DGL -patient takes this for hiatal hernia/  . OVER THE COUNTER MEDICATION 400 mg 2 (two) times a day. [place] -  . ranolazine (RANEXA) 1000 MG SR tablet Take 1 tablet (1,000 mg total) by mouth 2 (two) times daily.  . rosuvastatin (CRESTOR) 10 MG tablet Take 1 tablet (10 mg total) by mouth daily.  . Turmeric 500 MG CAPS Take by mouth daily.  . [DISCONTINUED] esomeprazole (NEXIUM) 40 MG capsule Take 1 capsule (40 mg total) by mouth daily before breakfast. (Patient not taking: Reported on 03/12/2019)   No facility-administered encounter medications on file as of 03/12/2019.    Past Medical History:   Diagnosis Date  . Anginal pain ([place])   . 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 Coronary artery disease   . Hyperlipidemia    triglycerides greater than 1000  . Low back pain 04/27/2012  . Pneumonia    hx of PNA  . Tobacco abuse    remote; also remote moderate alcohol consumption        GERD        Short segment Barrett's esophagus.        History of colonic adenomas.  None found on last colonoscopy of June 2018.        Chronic thrombocytopenia.        History of fatty liver which has resolved.   Past Surgical History:  Procedure Laterality Date  . CARDIAC CATHETERIZATION    . CORONARY ANGIOPLASTY WITH STENT PLACEMENT  02/17/2012    proximal LAD  . ESOPHAGOGASTRODUODENOSCOPY N/A 07/24/2014   Procedure: ESOPHAGOGASTRODUODENOSCOPY (EGD);  Surgeon: Rogene Houston, MD;  [facility];  Service: Endoscopy;  Laterality: N/A;  210  . MALONEY DILATION N/A 07/24/2014   Procedure: Venia Minks DILATION;  Surgeon: Rogene Houston, MD;  Location: [facility];  Service: Endoscopy;  Laterality: N/A;  . PERCUTANEOUS CORONARY STENT INTERVENTION (PCI-S) N/A 02/17/2012   Procedure: PERCUTANEOUS CORONARY STENT INTERVENTION (PCI-S);  Surgeon: Burnell Blanks, MD;  Location[facility] CATH LAB;  Service: Cardiovascular;  Laterality: N/A;   Allergies  Allergen Reactions  . Crestor [Rosuvastatin] Other (See Comments)    Myalgias; also Vytorin--  PT CAN SOMEWHAT TOLERATE CRESTOR 10 mg  . Isosorbide Mononitrate [Isosorbide Nitrate] Other (See Comments)    Unable to tolerate Imdur at a dose of 60 mg secondary to malaise and headache  . Vytorin [Ezetimibe-Simvastatin] Other (See Comments)    Myalgias; also rosuvastatin    Family history  Father lived to be 59 years old.  He had coronary artery disease. Mother died of  CHF at age 63. He has 1 sister age 44 has coronary artery disease and has undergone stenting.   Social history  Patient is married.  This is his second marriage. He has 2 children.  His biologic son has coronary artery disease and he has undergone CABG.  He is 46 years old. His stepdaughter is 9 years old and has type 1 diabetes. He retired in April of this year.  He worked at [employer] on [employer] for 17 years.  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 He quit cigarette smoking  20 years ago.  He smokes cigarettes for about 30 years about a pack a day.  He used to drink excessive amount of alcohol but now he has no more than 2 to 3/day but he does not drink every day.   Physical examination  Blood pressure (!) 145/86, pulse 62, temperature 98.1 F (36.7 C), temperature source Oral, resp. rate 18, height 5\' 10"  (1.778 m), weight 181 lb 14.4 oz (82.5 kg). Patient is alert and in no acute distress. Conjunctiva is pink. Sclera is nonicteric Oropharyngeal mucosa is normal. No neck masses or thyromegaly noted. Cardiac exam with regular rhythm normal S1 and S2. No murmur or gallop noted. Lungs are clear to auscultation. Abdomen is symmetrical.  Bowel sounds are normal.  On palpation abdomen is soft.  He has mild midepigastric tenderness.  No organomegaly or masses. No LE edema or clubbing noted.  Labs/studies Results:  Upper GI series normal on 02/06/2018.  Lab data from 10/26/2018 WBC 4.51 H&H 14.8 and 43.1 and platelet count 116 K. Bilirubin 0.9.   Assessment:  #1.  Chronic GERD.  He has well-documented history of erosive reflux esophagitis and short segment Barrett's esophagus.  Last EGD with biopsy was in June 2018 and negative for dysplasia.  He has tried all PPIs and it appears he is intolerant of this group of medications as these medications seem to have  caused chest and abdominal pain.  He is still having intermittent symptoms and would benefit from acid suppression.  Choices not limited to medications other than PPIs.  #2.  Epigastric pain which seems to occur postprandially and with exertion.  EGD 2 years ago negative for peptic ulcer disease.  He could have dyspepsia.  I am also concerned if some of his pain is due to angina.  He is being followed by Dr. Bronson Ing.   Recommendations  Continue antireflux measures. Famotidine 40 mg by mouth daily at bedtime. Will request biopsy results from last EGD of June 2018 and determine the timing of next EGD since he has Barrett's esophagus. Patient advised to keep symptom diary as to episodes of chest pain heartburn and epigastric pain until office visit in 1 month.

## 2019-03-12 NOTE — Patient Instructions (Addendum)
Keep symptom diary as to frequency of chest pain heartburn as well as epigastric pain until next office visit in 1 month. Will wait for results of CT which is planned for next week. Please also try to obtain copy of biopsy results from EGD of June 2018.

## 2019-03-14 ENCOUNTER — Telehealth (INDEPENDENT_AMBULATORY_CARE_PROVIDER_SITE_OTHER): Payer: Self-pay | Admitting: Internal Medicine

## 2019-03-14 NOTE — Telephone Encounter (Signed)
Note faxed.

## 2019-03-14 NOTE — Telephone Encounter (Signed)
Please fax notes from 03/12/19 visit to [phone]

## 2019-04-16 ENCOUNTER — Other Ambulatory Visit: Payer: Self-pay

## 2019-04-16 ENCOUNTER — Ambulatory Visit (INDEPENDENT_AMBULATORY_CARE_PROVIDER_SITE_OTHER): Payer: No Typology Code available for payment source | Admitting: Internal Medicine

## 2019-04-16 ENCOUNTER — Encounter (INDEPENDENT_AMBULATORY_CARE_PROVIDER_SITE_OTHER): Payer: Self-pay | Admitting: Internal Medicine

## 2019-04-16 VITALS — BP 153/83 | HR 64 | Temp 98.2°F | Resp 18 | Ht 70.0 in | Wt 180.8 lb

## 2019-04-16 DIAGNOSIS — K219 Gastro-esophageal reflux disease without esophagitis: Secondary | ICD-10-CM

## 2019-04-16 DIAGNOSIS — R1013 Epigastric pain: Secondary | ICD-10-CM | POA: Diagnosis not present

## 2019-04-16 NOTE — Patient Instructions (Signed)
Try taking nitroglycerin on some days before you play golf and see if pain can be prevented.   Also try taking extra dose of Pepcid 60 minutes before you play golf to see if these episodes can be prevented.

## 2019-04-16 NOTE — Progress Notes (Signed)
Presenting complaint;  Follow-up for epigastric pain.   Database and subjective:  Patient is 68 year old Caucasian male was history of erosive GERD short segment Barrett's esophagus was last EGD was in June 2018 at [facility] was seen in the office on 03/12/2019 for epigastric pain occurring after physical activity.  Prior cardiac evaluation was negative.  He had tried PPIs but without symptomatic relief.  I felt his pain could be due to GERD or esophageal spasm.  He was also having some pain at night. Patient was advised to keep symptom diary and begun on famotidine 40 mg at bedtime.  Patient states he feels much better.  He has been able to sleep at night.  Since his last visit he only has had mild episode of pain.  He had 2 episodes of pain which occurred when he started to play golf and each time he used nitroglycerin with relief. He denies heartburn chest pain nausea vomiting dysphagia melena or rectal bleeding.  He is not having any side effects with famotidine.  Current Medications: Outpatient Encounter Medications as of 04/16/2019  Medication Sig  . aspirin 81 MG tablet Take 81 mg by mouth daily.  . cholecalciferol (VITAMIN D3) 25 MCG (1000 UT) tablet Take 50,000 Units by mouth once a week.  . Coenzyme Q10 (CO Q 10 PO) Take by mouth 2 (two) times a day.   . Cyanocobalamin (VITAMIN B 12 PO) Take 2,500 mcg by mouth daily.  . famotidine (PEPCID) 40 MG tablet Take 1 tablet (40 mg total) by mouth at bedtime.  . Garlic 1443 MG TBEC Take by mouth 2 (two) times a day.  . Magnesium 200 MG TABS Take by mouth daily.  . metoprolol succinate (TOPROL-XL) 50 MG 24 hr tablet TAKE ONE TABLET BY MOUTH ONCE DAILY. TAKE WITH OR IMMEDIATELY FOLLOWING A MEAL  . nitroGLYCERIN (NITROSTAT) 0.4 MG SL tablet Place 1 tablet (0.4 mg total) under the tongue every 5 (five) minutes as needed for chest pain (up to 3 doses).  . Omega-3 Fatty Acids (FISH OIL) 1000 MG CAPS Take by mouth 2 (two) times a day.  Marland Kitchen OVER THE  COUNTER MEDICATION daily. DGL -patient takes this for hiatal hernia/  . OVER THE COUNTER MEDICATION 400 mg 2 (two) times a day. [place] -  . ranolazine (RANEXA) 1000 MG SR tablet Take 1 tablet (1,000 mg total) by mouth 2 (two) times daily.  . rosuvastatin (CRESTOR) 10 MG tablet Take 1 tablet (10 mg total) by mouth daily.  . Turmeric 500 MG CAPS Take by mouth daily.   No facility-administered encounter medications on file as of 04/16/2019.      Objective: Blood pressure (!) 153/83, pulse 64, temperature 98.2 F (36.8 C), temperature source Oral, resp. rate 18, height 5\' 10"  (1.778 m), weight 180 lb 12.8 oz (82 kg). Patient is alert and in no acute distress. Conjunctiva is pink. Sclera is nonicteric Oropharyngeal mucosa is normal. No neck masses or thyromegaly noted. Cardiac exam with regular rhythm normal S1 and S2. No murmur or gallop noted. Lungs are clear to auscultation. Abdomen is symmetrical soft and nontender with organomegaly or masses. No LE edema or clubbing noted.   Assessment:  #1.  Exercise-induced epigastric pain.  Cardiac evaluation has been negative.  Significant symptomatic improvement with nightly famotidine but he is still having mild episodes relieved with nitroglycerin.  Differential diagnosis includes dyspepsia exercise-induced esophageal spasm or atypical manifestation of GERD.  It remains to be seen if pain could be prevented  by him take an extra dose of famotidine or using nitroglycerin prior to him doing physical activity or playing golf.  Luckily he is not having any side effects with nitroglycerin such as headache or lightheadedness.  #2.  GERD with short segment Barrett's esophagus.  Last EGD was in June 2018 revealing no evidence of erosive esophagitis and biopsies revealed Barrett's without dysplasia.   Plan:  Patient will continue famotidine 40 mg p.o. nightly. Patient advised to take nitroglycerin 5 minutes before playing golf and see if pain could  be prevented. If pain cannot be prevented with this intervention he will try taking 40 mg of Pepcid 1 hour before playing golf. Patient will call with progress report in few weeks and return for office visit in 6 months.

## 2019-05-31 ENCOUNTER — Ambulatory Visit: Payer: Medicare HMO | Admitting: Cardiovascular Disease

## 2019-06-17 DIAGNOSIS — R69 Illness, unspecified: Secondary | ICD-10-CM | POA: Diagnosis not present

## 2019-06-20 DIAGNOSIS — H524 Presbyopia: Secondary | ICD-10-CM | POA: Diagnosis not present

## 2019-07-04 DIAGNOSIS — R69 Illness, unspecified: Secondary | ICD-10-CM | POA: Diagnosis not present

## 2019-07-19 ENCOUNTER — Encounter: Payer: Self-pay | Admitting: Cardiovascular Disease

## 2019-07-19 ENCOUNTER — Other Ambulatory Visit: Payer: Self-pay

## 2019-07-19 ENCOUNTER — Ambulatory Visit (INDEPENDENT_AMBULATORY_CARE_PROVIDER_SITE_OTHER): Payer: Medicare HMO | Admitting: Cardiovascular Disease

## 2019-07-19 VITALS — BP 158/78 | HR 78 | Ht 70.0 in | Wt 183.0 lb

## 2019-07-19 DIAGNOSIS — Z955 Presence of coronary angioplasty implant and graft: Secondary | ICD-10-CM

## 2019-07-19 DIAGNOSIS — I10 Essential (primary) hypertension: Secondary | ICD-10-CM

## 2019-07-19 DIAGNOSIS — E785 Hyperlipidemia, unspecified: Secondary | ICD-10-CM

## 2019-07-19 DIAGNOSIS — I25118 Atherosclerotic heart disease of native coronary artery with other forms of angina pectoris: Secondary | ICD-10-CM

## 2019-07-19 MED ORDER — LOSARTAN POTASSIUM 25 MG PO TABS: 25.0000 mg | ORAL_TABLET | Freq: Every day | ORAL | 3 refills | Status: DC

## 2019-07-19 NOTE — Progress Notes (Signed)
SUBJECTIVE: The patient presents for routine follow-up.  Overall he is doing well and denies chest pain, palpitations, leg swelling and shortness of breath.  He golfs 3-4 times per week.  His blood pressure has been elevated lately.  He takes Pepcid for GERD.  Review of Systems: As per "subjective", otherwise negative.  Allergies  Allergen Reactions  . Crestor [Rosuvastatin] Other (See Comments)    Myalgias; also Vytorin--  PT CAN SOMEWHAT TOLERATE CRESTOR 10 mg  . Isosorbide Mononitrate [Isosorbide Nitrate] Other (See Comments)    Unable to tolerate Imdur at a dose of 60 mg secondary to malaise and headache  . Vytorin [Ezetimibe-Simvastatin] Other (See Comments)    Myalgias; also rosuvastatin     Current Outpatient Medications  Medication Sig Dispense Refill  . aspirin 81 MG tablet Take 81 mg by mouth daily.    . Coenzyme Q10 (CO Q 10 PO) Take by mouth 2 (two) times a day.     . famotidine (PEPCID) 40 MG tablet Take 1 tablet (40 mg total) by mouth at bedtime. 30 tablet 1  . Garlic [id number] MG TBEC Take by mouth 2 (two) times a day.    . Magnesium 200 MG TABS Take by mouth daily.    . metoprolol succinate (TOPROL-XL) 50 MG 24 hr tablet TAKE ONE TABLET BY MOUTH ONCE DAILY. TAKE WITH OR IMMEDIATELY FOLLOWING A MEAL 90 tablet 3  . nitroGLYCERIN (NITROSTAT) 0.4 MG SL tablet Place 1 tablet (0.4 mg total) under the tongue every 5 (five) minutes as needed for chest pain (up to 3 doses). 25 tablet 3  . Omega-3 Fatty Acids (FISH OIL) 1000 MG CAPS Take by mouth 2 (two) times a day.    Marland Kitchen OVER THE COUNTER MEDICATION daily. DGL -patient takes this for hiatal hernia/    . OVER THE COUNTER MEDICATION 400 mg 2 (two) times a day. [place] -    . polyethylene glycol (MIRALAX / GLYCOLAX) 17 g packet Take 17 g by mouth daily as needed.    . ranolazine (RANEXA) 1000 MG SR tablet Take 1 tablet (1,000 mg total) by mouth 2 (two) times daily. 60 tablet 3  . rosuvastatin (CRESTOR) 10 MG tablet  Take 1 tablet (10 mg total) by mouth daily. 90 tablet 3  . Turmeric 500 MG CAPS Take by mouth daily.     No current facility-administered medications for this visit.     Past Medical History:  Diagnosis Date  . Anginal pain ([place])   . 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 Coronary artery disease   . Hyperlipidemia    triglycerides greater than 1000  . Low back pain 04/27/2012  . Pneumonia    hx of PNA  . Tobacco abuse    remote; also remote moderate alcohol consumption    Past Surgical History:  Procedure Laterality Date  . CARDIAC CATHETERIZATION    . CORONARY ANGIOPLASTY WITH STENT PLACEMENT  02/17/2012    proximal LAD  . ESOPHAGOGASTRODUODENOSCOPY N/A 07/24/2014   Procedure: ESOPHAGOGASTRODUODENOSCOPY (EGD);  Surgeon: Rogene Houston, MD;  [facility];  Service: Endoscopy;  Laterality: N/A;  210  . MALONEY DILATION N/A 07/24/2014   Procedure: Venia Minks DILATION;  Surgeon: Rogene Houston, MD;  [facility]: [facility];  Service: Endoscopy;  Laterality: N/A;  .  PERCUTANEOUS CORONARY STENT INTERVENTION (PCI-S) N/A 02/17/2012   Procedure: PERCUTANEOUS CORONARY STENT INTERVENTION (PCI-S);  Surgeon: Burnell Blanks, MD;  Location[facility] CATH LAB;  Service: Cardiovascular;  Laterality: N/A;    Social History   Socioeconomic History  . Marital status: Married    Spouse name: Not on file  . Number of children: Not on file  . Years of education: Not on file  . Highest education level: Not on file  Occupational History  . Not on file  Social Needs  . Financial resource strain: Not on file  . Food insecurity    Worry: Not on file    Inability: Not on file  . Transportation needs    Medical: Not on file    Non-medical: Not on file  Tobacco Use  . Smoking status: Former Smoker    Packs/day:  1.50    Years: 35.00    Pack years: 52.50    Types: Cigarettes    Quit date: 09/05/1998    Years since quitting: 20.8  . Smokeless tobacco: Never Used  . Tobacco comment: Patient quit May of 2000  Substance and Sexual Activity  . Alcohol use: Not on file    Comment: 6-8 beers daily   . Drug use: No  . Sexual activity: Not on file  Lifestyle  . Physical activity    Days per week: Not on file    Minutes per session: Not on file  . Stress: Not on file  Relationships  . Social Herbalist on phone: Not on file    Gets together: Not on file    Attends religious service: Not on file    Active member of club or organization: Not on file    Attends meetings of clubs or organizations: Not on file    Relationship status: Not on file  . Intimate partner violence    Fear of current or ex partner: Not on file    Emotionally abused: Not on file    Physically abused: Not on file    Forced sexual activity: Not on file  Other Topics Concern  . Not on file  Social History Narrative  . Not on file     Vitals:   07/19/19 1605  BP: (!) 158/78  Pulse: 78  SpO2: 98%  Weight: 183 lb (83 kg)  Height: 5\' 10"  (1.778 m)    Wt Readings from Last 3 Encounters:  07/19/19 183 lb (83 kg)  04/16/19 180 lb 12.8 oz (82 kg)  03/12/19 181 lb 14.4 oz (82.5 kg)     PHYSICAL EXAM General: NAD HEENT: Normal. Neck: No JVD, no thyromegaly. Lungs: Clear to auscultation bilaterally with normal respiratory effort. CV: Regular rate and rhythm, normal S1/S2, no S3/S4, no murmur. No pretibial or periankle edema.  No carotid bruit.   Abdomen: Soft, nontender, no distention.  Neurologic: Alert and oriented.  Psych: Normal affect. Skin: Normal. Musculoskeletal: No gross deformities.      Labs: Lab Results  Component Value Date/Time   K 4.5 09/11/2013 04:02 PM   BUN 13 09/11/2013 04:02 PM   CREATININE 1.03 09/11/2013 04:02 PM   ALT 27 01/30/2013 01:11 PM   HGB 13.5 03/21/2012 12:41 PM      Lipids: Lab Results  Component Value Date/Time   LDLCALC 87 01/30/2013 01:11 PM   LDLDIRECT 120 (H) 01/03/2013 08:45 AM   CHOL 185 01/30/2013 01:11 PM   TRIG 261 (H) 01/30/2013 01:11 PM   HDL 46 01/30/2013  01:11 PM       ASSESSMENT AND PLAN: 1. Coronary artery disease: Symptomatically stable.  Currently on aspirin, metoprolol succinate, rosuvastatin and ranolazine. Prior episodes of chest pain more more suggestive of a gastrointestinal etiology. However he has had previous symptom overlap which required drug-eluting stent placement.  Nuclear stress test in January 2020 was abnormal.  Horizontal ST segment depression was noted in leads II, aVF, V5, and V6 of roughly 0.5 mm.  There was a moderate size defect in the mid anteroseptal, mid inferoseptal, apical anterior, apical septal and apical inferior walls consistent with prior MI with a moderate degree of peri-infarct ischemia.  It was deemed an intermediate risk study.  LVEF 57%.  2. Hypertension: Blood pressure is elevated today.  I will try losartan 25 mg daily.  3. Hyperlipidemia: Currently tolerating rosuvastatin.   Disposition: Follow up 1 yr   Kate Sable, M.D., F.A.C.C.

## 2019-07-19 NOTE — Patient Instructions (Addendum)
Medication Instructions:    Your physician has recommended you make the following change in your medication:   Start losartan 25 mg by mouth daily  Continue other medications the same  Labwork:  NONE  Testing/Procedures:  NONE  Follow-Up:  Your physician recommends that you schedule a follow-up appointment in: 1 year. You will receive a reminder letter in the mail in about 10 months reminding you to call and schedule your appointment. If you don't receive this letter, please contact our office.  Any Other Special Instructions Will Be Listed Below (If Applicable).  If you need a refill on your cardiac medications before your next appointment, please call your pharmacy.

## 2019-07-24 ENCOUNTER — Other Ambulatory Visit: Payer: Self-pay

## 2019-07-24 DIAGNOSIS — Z20822 Contact with and (suspected) exposure to covid-19: Secondary | ICD-10-CM

## 2019-07-26 LAB — NOVEL CORONAVIRUS, NAA: SARS-CoV-2, NAA: NOT DETECTED

## 2019-08-07 DIAGNOSIS — Z0100 Encounter for examination of eyes and vision without abnormal findings: Secondary | ICD-10-CM | POA: Diagnosis not present

## 2019-08-23 ENCOUNTER — Other Ambulatory Visit: Payer: Self-pay

## 2019-08-23 ENCOUNTER — Ambulatory Visit: Payer: No Typology Code available for payment source | Attending: Internal Medicine

## 2019-08-23 DIAGNOSIS — Z20822 Contact with and (suspected) exposure to covid-19: Secondary | ICD-10-CM

## 2019-08-24 LAB — NOVEL CORONAVIRUS, NAA: SARS-CoV-2, NAA: NOT DETECTED

## 2019-10-22 ENCOUNTER — Encounter (INDEPENDENT_AMBULATORY_CARE_PROVIDER_SITE_OTHER): Payer: Self-pay | Admitting: Internal Medicine

## 2019-10-22 ENCOUNTER — Other Ambulatory Visit: Payer: Self-pay

## 2019-10-22 ENCOUNTER — Ambulatory Visit (INDEPENDENT_AMBULATORY_CARE_PROVIDER_SITE_OTHER): Payer: Medicare Other | Admitting: Internal Medicine

## 2019-10-22 VITALS — BP 145/80 | HR 70 | Temp 97.4°F | Ht 70.0 in | Wt 194.8 lb

## 2019-10-22 DIAGNOSIS — K2270 Barrett's esophagus without dysplasia: Secondary | ICD-10-CM | POA: Diagnosis not present

## 2019-10-22 DIAGNOSIS — K219 Gastro-esophageal reflux disease without esophagitis: Secondary | ICD-10-CM | POA: Diagnosis not present

## 2019-10-22 NOTE — Progress Notes (Signed)
Presenting complaint;  Follow-up for chronic GERD complicated by short segment Barrett's esophagus.  Database and subjective:  Patient is 69 year old Caucasian male who has chronic GERD complicated by short segment Barrett's esophagus.  His last EGD was in June 2018 at [facility].  He has been on various PPIs and thought he was having chest pain.  On his last visit I recommended trying the famotidine 40 mg at bedtime. Patient is here for follow-up visit.  He was last seen on 04/16/2019. He states he is not taking famotidine anymore.  It caused him to have severe bloating.  Bloating has resolved since he stopped this medication.  He rarely has heartburn or regurgitation.  He says he took nitroglycerin because he had chest pressure last week.  He has not taken nitroglycerin in several months prior to that.  He takes D GL which is OTC medication occasionally.  His bowels are moving daily.  He is having to take MiraLAX sporadically.  He denies dysphagia nausea vomiting melena or rectal bleeding. He has gained 14 pounds since his last visit.  He feels weight gain is because of Covid and he is not getting out and just moving too much.  Current Medications: Outpatient Encounter Medications as of 10/22/2019  Medication Sig  . aspirin 81 MG tablet Take 81 mg by mouth daily.  . Coenzyme Q10 (CO Q 10 PO) Take by mouth 2 (two) times a day.   . Cyanocobalamin (VITAMIN B 12 PO) Take 2,500 mcg by mouth daily.  . Garlic [id number] MG TBEC Take by mouth 2 (two) times a day.  . losartan (COZAAR) 25 MG tablet Take 1 tablet (25 mg total) by mouth daily.  . Magnesium 200 MG TABS Take by mouth daily.  . metoprolol succinate (TOPROL-XL) 50 MG 24 hr tablet TAKE ONE TABLET BY MOUTH ONCE DAILY. TAKE WITH OR IMMEDIATELY FOLLOWING A MEAL  . nitroGLYCERIN (NITROSTAT) 0.4 MG SL tablet Place 1 tablet (0.4 mg total) under the tongue every 5 (five) minutes as needed for chest pain (up to 3 doses).  . Omega-3 Fatty Acids (FISH OIL)  1000 MG CAPS Take by mouth 2 (two) times a day.  Marland Kitchen OVER THE COUNTER MEDICATION daily. DGL -patient takes this for hiatal hernia/  . OVER THE COUNTER MEDICATION 400 mg 2 (two) times a day. [place] -  . polyethylene glycol (MIRALAX / GLYCOLAX) 17 g packet Take 17 g by mouth daily as needed.  . Probiotic Product (ALIGN PO) Take by mouth daily.  . ranolazine (RANEXA) 1000 MG SR tablet Take 1 tablet (1,000 mg total) by mouth 2 (two) times daily.  . rosuvastatin (CRESTOR) 10 MG tablet Take 1 tablet (10 mg total) by mouth daily.  . Turmeric 500 MG CAPS Take by mouth daily.  . famotidine (PEPCID) 40 MG tablet Take 1 tablet (40 mg total) by mouth at bedtime. (Patient not taking: Reported on 10/22/2019)   No facility-administered encounter medications on file as of 10/22/2019.     Objective: Blood pressure (!) 145/80, pulse 70, temperature (!) 97.4 F (36.3 C), temperature source Temporal, height 5\' 10"  (1.778 m), weight 194 lb 12.8 oz (88.4 kg). Patient is alert and in no acute distress. He is wearing a facial mask. Conjunctiva is pink. Sclera is nonicteric Oropharyngeal mucosa is normal. No neck masses or thyromegaly noted. Cardiac exam with regular rhythm normal S1 and S2. No murmur or gallop noted. Lungs are clear to auscultation. Abdomen full soft and nontender with organomegaly or masses.  No LE edema or clubbing noted.  Assessment:  #1.  Chronic GERD complicated by short segment Barrett's esophagus.  Patient presently is not on any acid suppression and appears to be doing well.  He has been intolerant of PPIs and he also developed bloating with famotidine.  General consensus is patients with Barrett's esophagus should be on some form of acid suppression even if there is symptomatic.  Patient is quite comfortable not taking any medications.  He does not smoke cigarettes or consume heavy alcohol.  Therefore his risk for dysplasia is low. Unless he has dysphagia relapse of his symptoms  would consider EGD in July 2023.   Plan:  Patient will continue antireflux measures as before. He can use simethicone or Gas-X OTC on as-needed basis for bloating. Patient will call office if he has heartburn more than once a week or if he develops dysphagia. Office visit in 1 year.

## 2019-10-22 NOTE — Patient Instructions (Signed)
Continue antireflux measures. Can take simethicone or Gas-X OTC on as needed for bloating. Notify if you experience heartburn more than once a week.

## 2019-11-11 ENCOUNTER — Other Ambulatory Visit: Payer: Self-pay | Admitting: Cardiovascular Disease

## 2020-02-11 ENCOUNTER — Telehealth: Payer: Self-pay | Admitting: Cardiovascular Disease

## 2020-02-11 MED ORDER — ROSUVASTATIN CALCIUM 10 MG PO TABS: 10.0000 mg | ORAL_TABLET | Freq: Every day | ORAL | 1 refills | Status: DC

## 2020-02-11 NOTE — Telephone Encounter (Signed)
Patient called requesting that we change Pharmacy to [facility] - states that they need refill on  rosuvastatin (CRESTOR) 10 MG sent to [facility].

## 2020-03-26 ENCOUNTER — Other Ambulatory Visit: Payer: Self-pay | Admitting: *Deleted

## 2020-03-26 MED ORDER — NITROGLYCERIN 0.4 MG SL SUBL: 0.4000 mg | SUBLINGUAL_TABLET | SUBLINGUAL | 3 refills | Status: DC | PRN

## 2020-07-29 ENCOUNTER — Other Ambulatory Visit: Payer: Self-pay | Admitting: *Deleted

## 2020-07-29 MED ORDER — ROSUVASTATIN CALCIUM 10 MG PO TABS: 10.0000 mg | ORAL_TABLET | Freq: Every day | ORAL | 0 refills | Status: DC

## 2020-09-03 ENCOUNTER — Encounter: Payer: Self-pay | Admitting: *Deleted

## 2020-10-27 ENCOUNTER — Ambulatory Visit (INDEPENDENT_AMBULATORY_CARE_PROVIDER_SITE_OTHER): Payer: No Typology Code available for payment source | Admitting: Internal Medicine

## 2020-11-03 ENCOUNTER — Other Ambulatory Visit: Payer: Self-pay | Admitting: Family Medicine

## 2020-11-03 ENCOUNTER — Ambulatory Visit (INDEPENDENT_AMBULATORY_CARE_PROVIDER_SITE_OTHER): Payer: Medicare Other | Admitting: Internal Medicine

## 2020-11-03 ENCOUNTER — Other Ambulatory Visit: Payer: Self-pay

## 2020-11-03 ENCOUNTER — Encounter (INDEPENDENT_AMBULATORY_CARE_PROVIDER_SITE_OTHER): Payer: Self-pay | Admitting: Internal Medicine

## 2020-11-03 VITALS — BP 145/75 | HR 71 | Temp 97.8°F | Ht 70.0 in | Wt 194.7 lb

## 2020-11-03 DIAGNOSIS — K2270 Barrett's esophagus without dysplasia: Secondary | ICD-10-CM

## 2020-11-03 DIAGNOSIS — K2100 Gastro-esophageal reflux disease with esophagitis, without bleeding: Secondary | ICD-10-CM | POA: Diagnosis not present

## 2020-11-03 DIAGNOSIS — Z86010 Personal history of colonic polyps: Secondary | ICD-10-CM

## 2020-11-03 MED ORDER — ESOMEPRAZOLE MAGNESIUM 40 MG PO CPDR: 40.0000 mg | DELAYED_RELEASE_CAPSULE | Freq: Every day | ORAL | 1 refills | Status: DC

## 2020-11-03 NOTE — Progress Notes (Signed)
Presenting complaint;  History of GERD complicated by short segment Barrett's esophagus. Intermittent lower sternal pain.  Database and subjective:  Patient is 70 year old Caucasian male who is here for yearly visit.  He has history of GERD complicated by short segment Barrett's esophagus.  His last EGD with biopsy was in June 2018 at [facility] when he also had colonoscopy and no polyps were found.  He has history of colonic polyps. He has been on various PPIs and stopped these medications thinking that they were causing chest pain.  On his last visit of February 2016 he was given prescription for famotidine bedtime.  He he states it made him feel bloated and he decided to stop the medication. He continues to complain of lower sternal pain which occurs when he walks to the mailbox usually after breakfast.  This pain is not associated with diaphoreses shortness of breath or palpitation.  This pain is described to be dull pain but at times it can be intense.  It does not radiate into his neck jaw or arms.  He previously has been evaluated by cardiologist.  He has an upcoming visit tomorrow for reevaluation. He he rarely has pain in the afternoon when he walks and or jogs.  He also reports having an episode of pain at 4:00 in the morning relieved with nitroglycerin.  He does not describe this pain to be heartburn.  He denies regurgitation dysphagia nausea or vomiting. His appetite is good.  His bowels move daily or every other day.  He denies melena or rectal bleeding. He has been watching his diet.  He is quit drinking coffee.  He drinks ginger tea with probiotic every day. He drinks beer occasionally and does not smoke cigarettes.  Current Medications: Outpatient Encounter Medications as of 11/03/2020  Medication Sig  . aspirin 81 MG tablet Take 81 mg by mouth daily.  . Coenzyme Q10 (CO Q 10 PO) Take by mouth 2 (two) times a day.   . Cyanocobalamin (VITAMIN B 12 PO) Take 2,500 mcg by  mouth daily.  . Garlic 4196 MG CAPS Take 1,000 mg by mouth daily.  Marland Kitchen losartan (COZAAR) 25 MG tablet Take 1 tablet (25 mg total) by mouth daily.  . Magnesium 200 MG TABS Take by mouth daily.  . metoprolol succinate (TOPROL-XL) 50 MG 24 hr tablet TAKE ONE TABLET BY MOUTH ONCE DAILY. TAKE WITH OR IMMEDIATELY FOLLOWING A MEAL  . nitroGLYCERIN (NITROSTAT) 0.4 MG SL tablet Place 1 tablet (0.4 mg total) under the tongue every 5 (five) minutes as needed for chest pain (up to 3 doses).  . Omega-3 Fatty Acids (FISH OIL) 1000 MG CAPS Take by mouth 2 (two) times a day.  . polyethylene glycol (MIRALAX / GLYCOLAX) 17 g packet Take 17 g by mouth daily as needed.  . ranolazine (RANEXA) 1000 MG SR tablet Take 1 tablet (1,000 mg total) by mouth 2 (two) times daily.  . rosuvastatin (CRESTOR) 10 MG tablet TAKE 1 TABLET BY MOUTH  DAILY (Patient not taking: Reported on 11/03/2020)  . [DISCONTINUED] OVER THE COUNTER MEDICATION daily as needed. DGL -patient takes this for hiatal hernia/  (Patient not taking: Reported on 11/03/2020)  . [DISCONTINUED] OVER THE COUNTER MEDICATION 400 mg 2 (two) times daily as needed. [place] -  (Patient not taking: Reported on 11/03/2020)  . [DISCONTINUED] Probiotic Product (ALIGN PO) Take by mouth daily. (Patient not taking: Reported on 11/03/2020)  . [DISCONTINUED] Turmeric 500 MG CAPS Take by mouth daily. (Patient  not taking: Reported on 11/03/2020)   No facility-administered encounter medications on file as of 11/03/2020.     Objective: Blood pressure (!) 145/75, pulse 71, temperature 97.8 F (36.6 C), temperature source Oral, height 5\' 10"  (1.778 m), weight 194 lb 11.2 oz (88.3 kg). Patient is alert and in no acute distress. He is wearing a mask. Conjunctiva is pink. Sclera is nonicteric Oropharyngeal mucosa is normal. No neck masses or thyromegaly noted. Cardiac exam with regular rhythm normal S1 and S2. No murmur or gallop noted. Lungs are clear to auscultation. Abdomen is full.   On palpation is soft and nontender with organomegaly or masses. No LE edema or clubbing noted.  Labs/studies Results: No recent lab data on file.  Assessment:  #1.  Intermittent retrosternal chest pain triggered by walking usually in the morning but rarely at other times.  This pain very well could be due to GE reflux given that he has chronic GERD complicated by short segment Barrett's esophagus he did have erosive disease when he had EGD back in November 2015.  He is presently on no therapy.  He has taken esomeprazole in the past and does not recall having side effects.  Not sure why he was switched over to other medications. I am curious to know if he would have chest pain if he walks to his mailbox before eating breakfast. Agree with plans for evaluation by cardiology.  #2.  Chronic GERD complicated by short segment Barrett's esophagus.  Last EGD with biopsy was in June 2018.  He would be due for repeat exam in June 2023 unless he develops dysphagia or does not respond to PPI therapy.  #3.  History of colonic adenomas.  None found on his last exam of June 2018.  [facility] recommended repeat exam in June 2023.   Plan:  Begin Nexium/esomeprazole 40 mg p.o. every morning.  Patient will take first dose before lunch today.  Prescription sent to local pharmacy for 30 days with 1 refill.  If he tolerates esomeprazole will send long-term prescription to mail order pharmacy. Patient advised to walk to his mailbox before and after breakfast and see if there is any difference. Cardiology evaluation as planned. Patient will return for office visit in 2 months.

## 2020-11-03 NOTE — Patient Instructions (Signed)
Please call office with progress report in 2 weeks. If Nexium/esomeprazole works and you have no side effects will send 90-day supply to [facility]

## 2020-11-04 ENCOUNTER — Other Ambulatory Visit: Payer: Self-pay | Admitting: *Deleted

## 2020-11-04 ENCOUNTER — Ambulatory Visit: Payer: No Typology Code available for payment source | Admitting: Cardiology

## 2020-11-04 ENCOUNTER — Telehealth: Payer: Self-pay | Admitting: Cardiology

## 2020-11-04 ENCOUNTER — Encounter: Payer: Self-pay | Admitting: Cardiology

## 2020-11-04 ENCOUNTER — Other Ambulatory Visit: Payer: Self-pay | Admitting: Cardiology

## 2020-11-04 VITALS — BP 144/64 | HR 62 | Ht 70.0 in | Wt 195.0 lb

## 2020-11-04 DIAGNOSIS — I200 Unstable angina: Secondary | ICD-10-CM

## 2020-11-04 DIAGNOSIS — Z789 Other specified health status: Secondary | ICD-10-CM

## 2020-11-04 DIAGNOSIS — I25119 Atherosclerotic heart disease of native coronary artery with unspecified angina pectoris: Secondary | ICD-10-CM

## 2020-11-04 DIAGNOSIS — E782 Mixed hyperlipidemia: Secondary | ICD-10-CM | POA: Diagnosis not present

## 2020-11-04 MED ORDER — ROSUVASTATIN CALCIUM 20 MG PO TABS
20.0000 mg | ORAL_TABLET | Freq: Every day | ORAL | 3 refills | Status: DC
Start: 2020-11-04 — End: 2020-12-21

## 2020-11-04 MED ORDER — SODIUM CHLORIDE 0.9% FLUSH: 3.0000 mL | Freq: Two times a day (BID) | INTRAVENOUS | Status: DC

## 2020-11-04 MED ORDER — LOSARTAN POTASSIUM 25 MG PO TABS: 25.0000 mg | ORAL_TABLET | Freq: Every day | ORAL | 3 refills | Status: DC

## 2020-11-04 NOTE — Patient Instructions (Addendum)
Medication Instructions:   Your physician has recommended you make the following change in your medication:   Resume crestor at 20 mg by mouth daily at bedtime  Continue other medications the same  Labwork:  Covid screen 11/09/2020 @1 :05 pm at [facility]. Please quarantine after covid test until your heart cath is complete.   Testing/Procedures: Your physician has requested that you have a cardiac catheterization. Cardiac catheterization is used to diagnose and/or treat various heart conditions. Doctors may recommend this procedure for a number of different reasons. The most common reason is to evaluate chest pain. Chest pain can be a symptom of coronary artery disease (CAD), and cardiac catheterization can show whether plaque is narrowing or blocking your heart's arteries. This procedure is also used to evaluate the valves, as well as measure the blood flow and oxygen levels in different parts of your heart. For further information please visit [email or web address]. Please follow instruction sheet, as given.  Follow-Up:  Your physician recommends that you schedule a follow-up appointment in: 1 month.  Any Other Special Instructions Will Be Listed Below (If Applicable).  If you need a refill on your cardiac medications before your next appointment, please call your pharmacy.     [place] [address] Dept: [phone] Loc: [phone]  [name]  11/04/2020  You are scheduled for a Cardiac Catheterization on Wednesday, March 9 with Dr. Larae Grooms.  1. Please arrive at the [facility] (Main Entrance A) at [facility]: [address] [place], [place] [address] at 9:30 AM (This time is two hours before your procedure to ensure your preparation). Free valet parking service is available.   Special note: Every effort is made to have  your procedure done on time. Please understand that emergencies sometimes delay scheduled procedures.  2. Diet: Do not eat solid foods after midnight.  The patient may have clear liquids until 5am upon the day of the procedure.  3. Labs: You will need to have blood drawn on Monday, March 7 at [facility]. Quarantine after covid test until your heart cath is completed.  4. Medication instructions in preparation for your procedure:   Contrast Allergy: No  On the morning of your procedure, take your Aspirin 81 mg and any morning medicines NOT listed above.  You may use sips of water.  5. Plan for one night stay--bring personal belongings. 6. Bring a current list of your medications and current insurance cards. 7. You MUST have a responsible person to drive you home. 8. Someone MUST be with you the first 24 hours after you arrive home or your discharge will be delayed. 9. Please wear clothes that are easy to get on and off and wear slip-on shoes.  Thank you for allowing [place] to care for you!   -- [facility]

## 2020-11-04 NOTE — Telephone Encounter (Signed)
Pre-cert Verification for the following procedure     Left heart cath dx: accelerating exertional angina Wednesday, November 11, 2020 @11 :30 am with Dr. Irish Lack

## 2020-11-04 NOTE — Progress Notes (Signed)
Cardiology Office Note  Date: 11/04/2020   ID: Lopez, Dentinger 07-Jan-1951, MRN [id number]  PCP: [facility] [place] Louretta Shorten) Cardiologist:  Rozann Lesches, MD Electrophysiologist:  None   Chief Complaint  Patient presents with  . Cardiac follow-up    History of Present Illness: [name] is a 70 y.o. male former patient of Dr. Bronson Ing now presenting to establish follow-up with me.  This is our first meeting, I reviewed his records and updated the chart.  He was last seen in November 2020.  He presents reporting recurring exertional chest tightness consistent with angina.  He also reports symptoms at nighttime, he takes nitroglycerin with improvement.  He does have GERD with Barrett's esophagus, and has difficulty distinguishing reflux from angina sometimes.  He was just seen by Dr. Laural Golden yesterday, I reviewed the note.  He had symptoms after walking in from the parking lot today.  I personally reviewed his ECG today which is normal.  We went over his medications.  He stopped Crestor about a month ago complaining of abdominal bloating, but also thought it might have been related to Nexium.  He has a history of prior intolerance to statins including Vytorin and Lipitor.  Unfortunately, he has significantly abnormal lipids, lab work from August 2021 showed LDL of 221 and total cholesterol 300.  Last ischemic testing was in January 2020 as noted below. I do not have complete information regarding prior coronary PCI, he did undergo DES to the LAD in 2007, apparently had reintervention related to in-stent restenosis through the [place] system about 5 years ago, I do not have access to these records.  He also has branch vessel disease it has been managed medically.  Past Medical History:  Diagnosis Date  . Barrett's esophagus   . Coronary artery disease    ACS in 02/2006: DES to subtotal LAD; 50% D1; 30% CX; normal RCA; normal EF;repeat cath in 06/2006: Residual  80% small D1 and 70% D2-medical therapy advised  . GERD (gastroesophageal reflux disease)   . History of pneumonia   . Hyperlipidemia   . Low back pain     Past Surgical History:  Procedure Laterality Date  . CARDIAC CATHETERIZATION    . CORONARY ANGIOPLASTY WITH STENT PLACEMENT  02/17/2012    proximal LAD  . ESOPHAGOGASTRODUODENOSCOPY N/A 07/24/2014   Procedure: ESOPHAGOGASTRODUODENOSCOPY (EGD);  Surgeon: Rogene Houston, MD;  Location: [facility];  Service: Endoscopy;  Laterality: N/A;  210  . MALONEY DILATION N/A 07/24/2014   Procedure: Venia Minks DILATION;  Surgeon: Rogene Houston, MD;  Location: [facility];  Service: Endoscopy;  Laterality: N/A;  . PERCUTANEOUS CORONARY STENT INTERVENTION (PCI-S) N/A 02/17/2012   Procedure: PERCUTANEOUS CORONARY STENT INTERVENTION (PCI-S);  Surgeon: Burnell Blanks, MD;  Location: [facility] CATH LAB;  Service: Cardiovascular;  Laterality: N/A;    Current Outpatient Medications  Medication Sig Dispense Refill  . allopurinol (ZYLOPRIM) 100 MG tablet Take 100 mg by mouth daily.    Marland Kitchen aspirin 81 MG tablet Take 81 mg by mouth daily.    . Coenzyme Q10 (CO Q 10 PO) Take by mouth 2 (two) times a day.     . Cyanocobalamin (VITAMIN B 12 PO) Take 2,500 mcg by mouth daily.    Marland Kitchen esomeprazole (NEXIUM) 40 MG capsule Take 1 capsule (40 mg total) by mouth daily before breakfast. 30 capsule 1  . Garlic 7026 MG CAPS Take 1,000 mg by mouth daily.    Marland Kitchen  losartan (COZAAR) 25 MG tablet Take 1 tablet (25 mg total) by mouth daily. 90 tablet 3  . Magnesium 200 MG TABS Take by mouth daily.    . metoprolol succinate (TOPROL-XL) 50 MG 24 hr tablet TAKE ONE TABLET BY MOUTH ONCE DAILY. TAKE WITH OR IMMEDIATELY FOLLOWING A MEAL 90 tablet 3  . nitroGLYCERIN (NITROSTAT) 0.4 MG SL tablet Place 1 tablet (0.4 mg total) under the tongue every 5 (five) minutes as needed for chest pain (up to 3 doses). 25 tablet 3  . Omega-3 Fatty Acids (FISH OIL) 1000 MG CAPS Take by mouth 2 (two)  times a day.    . polyethylene glycol (MIRALAX / GLYCOLAX) 17 g packet Take 17 g by mouth daily as needed.    . ranolazine (RANEXA) 1000 MG SR tablet Take 1 tablet (1,000 mg total) by mouth 2 (two) times daily. 60 tablet 3  . rosuvastatin (CRESTOR) 20 MG tablet Take 1 tablet (20 mg total) by mouth daily. 90 tablet 3   No current facility-administered medications for this visit.   Allergies:  Crestor [rosuvastatin], Isosorbide mononitrate [isosorbide nitrate], and Vytorin [ezetimibe-simvastatin]   Social History: The patient  reports that he quit smoking about 22 years ago. His smoking use included cigarettes. He has a 52.50 pack-year smoking history. He has never used smokeless tobacco. He reports current alcohol use. He reports that he does not use drugs.   Family History: The patient's family history includes Heart disease in his sister and son; Hyperlipidemia in his father and mother.   ROS: No palpitations or syncope.  Physical Exam: VS:  BP (!) 144/64   Pulse 62   Ht 5\' 10"  (1.778 m)   Wt 195 lb (88.5 kg)   SpO2 97%   BMI 27.98 kg/m , BMI Body mass index is 27.98 kg/m.  Wt Readings from Last 3 Encounters:  11/04/20 195 lb (88.5 kg)  11/03/20 194 lb 11.2 oz (88.3 kg)  10/22/19 194 lb 12.8 oz (88.4 kg)    General: Patient appears comfortable at rest. HEENT: Conjunctiva and lids normal, wearing a mask. Neck: Supple, no elevated JVP or carotid bruits, no thyromegaly. Lungs: Clear to auscultation, nonlabored breathing at rest. Cardiac: Regular rate and rhythm, no S3, soft systolic murmur, no pericardial rub. Abdomen: Soft, nontender, bowel sounds present, no guarding or rebound. Extremities: No pitting edema, distal pulses 2+. Skin: Warm and dry. Musculoskeletal: No kyphosis. Neuropsychiatric: Alert and oriented x3, affect grossly appropriate.  ECG:  An ECG dated 09/04/2018 was personally reviewed today and demonstrated:  Sinus rhythm with RSR' in V1 and V2.  Recent  Labwork:  August 2021: Hemoglobin A1c 4.9%, hemoglobin 14.4, platelets 115, BUN 13, creatinine 0.9, potassium 5.2, AST 29, ALT 44, cholesterol 300, triglycerides 139, HDL 51, LDL 221, TSH 1.2  Other Studies Reviewed Today:  Exercise Myoview 09/18/2018:  Horizontal ST segment depression ST segment depression of 0.5 mm was noted during stress in the II, aVF, V5 and V6 leads, and returning to baseline after 1-5 minutes of recovery.  No T wave inversion was noted during stress.  Defect 1: There is a medium defect of moderate severity present in the mid anteroseptal, mid inferoseptal, apical anterior, apical septal and apical inferior location.  Findings consistent with prior myocardial infarction with a moderate degree of peri-infarct ischemia.  This is an intermediate risk study.  Nuclear stress EF: 57%.  Assessment and Plan:  1.  Accelerating exertional angina.  Patient has a prior history of DES to the  LAD and possibly reintervention related to in-stent restenosis through the [facility] system perhaps about 5 years ago.  I do not have access to complete information.  He did have a follow-up exercise Myoview in January 2020 which was abnormal showing moderate predominantly anterior/anteroseptal distribution ischemia in the setting of scar that was managed medically by Dr. Bronson Ing.  Resting ECG is normal today.  Plan to continue aspirin, Cozaar, Toprol-XL, increase Ranexa to twice daily (he had been taking it once a day), also resume Crestor although at 20 mg daily.  We discussed the risks and benefits of a diagnostic cardiac catheterization to reevaluate coronary anatomy and assess for any potential revascularization options.  He is in agreement to proceed.  2.  Mixed hyperlipidemia, LDL 221 by lab work from August 2021.  He has a history of statin intolerance (fatigue and myalgias) including Vytorin and Lipitor.  We are going to rechallenge with Crestor, if he does not tolerate this and/or  cannot get LDL back down to goal, he would be a good candidate for PCSK9 inhibitor.  3.  GERD with Barrett's esophagus.  Now back on Nexium and following with Dr. Laural Golden.   Medication Adjustments/Labs and Tests Ordered: Current medicines are reviewed at length with the patient today.  Concerns regarding medicines are outlined above.   Tests Ordered: Orders Placed This Encounter  Procedures  . EKG 12-Lead    Medication Changes: Meds ordered this encounter  Medications  . rosuvastatin (CRESTOR) 20 MG tablet    Sig: Take 1 tablet (20 mg total) by mouth daily.    Dispense:  90 tablet    Refill:  3    11/04/2020 dose increase    Disposition:  Follow up after procedure.  Signed, Satira Sark, MD, [facility] 11/04/2020 12:27 PM    [place] [facility] [place], [place], [place] [address] Phone: [phone]; Fax: [phone]

## 2020-11-04 NOTE — H&P (View-Only) (Signed)
Cardiology Office Note  Date: 11/04/2020   ID: Juan, Hobbs Sep 01, 1951, MRN [id number]  PCP: [facility] in [place] Louretta Shorten) Cardiologist:  Rozann Lesches, MD Electrophysiologist:  None   Chief Complaint  Patient presents with  . Cardiac follow-up    History of Present Illness: [name] is a 70 y.o. male former patient of Dr. Bronson Ing now presenting to establish follow-up with me.  This is our first meeting, I reviewed his records and updated the chart.  He was last seen in November 2020.  He presents reporting recurring exertional chest tightness consistent with angina.  He also reports symptoms at nighttime, he takes nitroglycerin with improvement.  He does have GERD with Barrett's esophagus, and has difficulty distinguishing reflux from angina sometimes.  He was just seen by Dr. Laural Golden yesterday, I reviewed the note.  He had symptoms after walking in from the parking lot today.  I personally reviewed his ECG today which is normal.  We went over his medications.  He stopped Crestor about a month ago complaining of abdominal bloating, but also thought it might have been related to Nexium.  He has a history of prior intolerance to statins including Vytorin and Lipitor.  Unfortunately, he has significantly abnormal lipids, lab work from August 2021 showed LDL of 221 and total cholesterol 300.  Last ischemic testing was in January 2020 as noted below. I do not have complete information regarding prior coronary PCI, he did undergo DES to the LAD in 2007, apparently had reintervention related to in-stent restenosis through the [place] system about 5 years ago, I do not have access to these records.  He also has branch vessel disease it has been managed medically.  Past Medical History:  Diagnosis Date  . Barrett's esophagus   . Coronary artery disease    ACS in 02/2006: DES to subtotal LAD; 50% D1; 30% CX; normal RCA; normal EF;repeat cath in 06/2006: Residual  80% small D1 and 70% D2-medical therapy advised  . GERD (gastroesophageal reflux disease)   . History of pneumonia   . Hyperlipidemia   . Low back pain     Past Surgical History:  Procedure Laterality Date  . CARDIAC CATHETERIZATION    . CORONARY ANGIOPLASTY WITH STENT PLACEMENT  02/17/2012    proximal LAD  . ESOPHAGOGASTRODUODENOSCOPY N/A 07/24/2014   Procedure: ESOPHAGOGASTRODUODENOSCOPY (EGD);  Surgeon: Rogene Houston, MD;  [facility];  Service: Endoscopy;  Laterality: N/A;  210  . MALONEY DILATION N/A 07/24/2014   Procedure: Venia Minks DILATION;  Surgeon: Rogene Houston, MD;  [facility];  Service: Endoscopy;  Laterality: N/A;  . PERCUTANEOUS CORONARY STENT INTERVENTION (PCI-S) N/A 02/17/2012   Procedure: PERCUTANEOUS CORONARY STENT INTERVENTION (PCI-S);  Surgeon: Burnell Blanks, MD;  [facility]: [facility] CATH LAB;  Service: Cardiovascular;  Laterality: N/A;    Current Outpatient Medications  Medication Sig Dispense Refill  . allopurinol (ZYLOPRIM) 100 MG tablet Take 100 mg by mouth daily.    Marland Kitchen aspirin 81 MG tablet Take 81 mg by mouth daily.    . Coenzyme Q10 (CO Q 10 PO) Take by mouth 2 (two) times a day.     . Cyanocobalamin (VITAMIN B 12 PO) Take 2,500 mcg by mouth daily.    Marland Kitchen esomeprazole (NEXIUM) 40 MG capsule Take 1 capsule (40 mg total) by mouth daily before breakfast. 30 capsule 1  . Garlic 2119 MG CAPS Take 1,000 mg by mouth daily.    Marland Kitchen  losartan (COZAAR) 25 MG tablet Take 1 tablet (25 mg total) by mouth daily. 90 tablet 3  . Magnesium 200 MG TABS Take by mouth daily.    . metoprolol succinate (TOPROL-XL) 50 MG 24 hr tablet TAKE ONE TABLET BY MOUTH ONCE DAILY. TAKE WITH OR IMMEDIATELY FOLLOWING A MEAL 90 tablet 3  . nitroGLYCERIN (NITROSTAT) 0.4 MG SL tablet Place 1 tablet (0.4 mg total) under the tongue every 5 (five) minutes as needed for chest pain (up to 3 doses). 25 tablet 3  . Omega-3 Fatty Acids (FISH OIL) 1000 MG CAPS Take by mouth 2 (two)  times a day.    . polyethylene glycol (MIRALAX / GLYCOLAX) 17 g packet Take 17 g by mouth daily as needed.    . ranolazine (RANEXA) 1000 MG SR tablet Take 1 tablet (1,000 mg total) by mouth 2 (two) times daily. 60 tablet 3  . rosuvastatin (CRESTOR) 20 MG tablet Take 1 tablet (20 mg total) by mouth daily. 90 tablet 3   No current facility-administered medications for this visit.   Allergies:  Crestor [rosuvastatin], Isosorbide mononitrate [isosorbide nitrate], and Vytorin [ezetimibe-simvastatin]   Social History: The patient  reports that he quit smoking about 22 years ago. His smoking use included cigarettes. He has a 52.50 pack-year smoking history. He has never used smokeless tobacco. He reports current alcohol use. He reports that he does not use drugs.   Family History: The patient's family history includes Heart disease in his sister and son; Hyperlipidemia in his father and mother.   ROS: No palpitations or syncope.  Physical Exam: VS:  BP (!) 144/64   Pulse 62   Ht 5\' 10"  (1.778 m)   Wt 195 lb (88.5 kg)   SpO2 97%   BMI 27.98 kg/m , BMI Body mass index is 27.98 kg/m.  Wt Readings from Last 3 Encounters:  11/04/20 195 lb (88.5 kg)  11/03/20 194 lb 11.2 oz (88.3 kg)  10/22/19 194 lb 12.8 oz (88.4 kg)    General: Patient appears comfortable at rest. HEENT: Conjunctiva and lids normal, wearing a mask. Neck: Supple, no elevated JVP or carotid bruits, no thyromegaly. Lungs: Clear to auscultation, nonlabored breathing at rest. Cardiac: Regular rate and rhythm, no S3, soft systolic murmur, no pericardial rub. Abdomen: Soft, nontender, bowel sounds present, no guarding or rebound. Extremities: No pitting edema, distal pulses 2+. Skin: Warm and dry. Musculoskeletal: No kyphosis. Neuropsychiatric: Alert and oriented x3, affect grossly appropriate.  ECG:  An ECG dated 09/04/2018 was personally reviewed today and demonstrated:  Sinus rhythm with RSR' in V1 and V2.  Recent  Labwork:  August 2021: Hemoglobin A1c 4.9%, hemoglobin 14.4, platelets 115, BUN 13, creatinine 0.9, potassium 5.2, AST 29, ALT 44, cholesterol 300, triglycerides 139, HDL 51, LDL 221, TSH 1.2  Other Studies Reviewed Today:  Exercise Myoview 09/18/2018:  Horizontal ST segment depression ST segment depression of 0.5 mm was noted during stress in the II, aVF, V5 and V6 leads, and returning to baseline after 1-5 minutes of recovery.  No T wave inversion was noted during stress.  Defect 1: There is a medium defect of moderate severity present in the mid anteroseptal, mid inferoseptal, apical anterior, apical septal and apical inferior location.  Findings consistent with prior myocardial infarction with a moderate degree of peri-infarct ischemia.  This is an intermediate risk study.  Nuclear stress EF: 57%.  Assessment and Plan:  1.  Accelerating exertional angina.  Patient has a prior history of DES to the  LAD and possibly reintervention related to in-stent restenosis through the [facility] system perhaps about 5 years ago.  I do not have access to complete information.  He did have a follow-up exercise Myoview in January 2020 which was abnormal showing moderate predominantly anterior/anteroseptal distribution ischemia in the setting of scar that was managed medically by Dr. Bronson Ing.  Resting ECG is normal today.  Plan to continue aspirin, Cozaar, Toprol-XL, increase Ranexa to twice daily (he had been taking it once a day), also resume Crestor although at 20 mg daily.  We discussed the risks and benefits of a diagnostic cardiac catheterization to reevaluate coronary anatomy and assess for any potential revascularization options.  He is in agreement to proceed.  2.  Mixed hyperlipidemia, LDL 221 by lab work from August 2021.  He has a history of statin intolerance (fatigue and myalgias) including Vytorin and Lipitor.  We are going to rechallenge with Crestor, if he does not tolerate this and/or  cannot get LDL back down to goal, he would be a good candidate for PCSK9 inhibitor.  3.  GERD with Barrett's esophagus.  Now back on Nexium and following with Dr. Laural Golden.   Medication Adjustments/Labs and Tests Ordered: Current medicines are reviewed at length with the patient today.  Concerns regarding medicines are outlined above.   Tests Ordered: Orders Placed This Encounter  Procedures  . EKG 12-Lead    Medication Changes: Meds ordered this encounter  Medications  . rosuvastatin (CRESTOR) 20 MG tablet    Sig: Take 1 tablet (20 mg total) by mouth daily.    Dispense:  90 tablet    Refill:  3    11/04/2020 dose increase    Disposition:  Follow up after procedure.  Signed, Satira Sark, MD, [facility] 11/04/2020 12:27 PM    [address], [place], [place] [address] Phone: [phone]; Fax: [phone]

## 2020-11-09 ENCOUNTER — Other Ambulatory Visit (HOSPITAL_COMMUNITY)
Admission: RE | Admit: 2020-11-09 | Discharge: 2020-11-09 | Disposition: A | Discharge status: Home / Self Care | Payer: Medicare Other | Source: Ambulatory Visit | Attending: Interventional Cardiology | Admitting: Interventional Cardiology

## 2020-11-09 ENCOUNTER — Other Ambulatory Visit: Payer: Self-pay

## 2020-11-09 DIAGNOSIS — Z01812 Encounter for preprocedural laboratory examination: Secondary | ICD-10-CM | POA: Insufficient documentation

## 2020-11-09 DIAGNOSIS — Z20822 Contact with and (suspected) exposure to covid-19: Secondary | ICD-10-CM | POA: Diagnosis not present

## 2020-11-09 LAB — SARS CORONAVIRUS 2 (TAT 6-24 HRS): SARS Coronavirus 2: NEGATIVE

## 2020-11-10 ENCOUNTER — Telehealth: Payer: Self-pay | Admitting: *Deleted

## 2020-11-10 NOTE — Telephone Encounter (Signed)
Pt contacted pre-catheterization scheduled at [facility] for: Wednesday November 11, 2020 11:30 AM Verified arrival time and place: [place] [facility]) at: 9:30 AM   No solid food after midnight prior to cath, clear liquids until 5 AM day of procedure.   AM meds can be  taken pre-cath with sips of water including: ASA 81 mg   Confirmed patient has responsible adult to drive home post procedure and be with patient first 24 hours after arriving home: yes  You are allowed ONE visitor in the waiting room during the time you are at the hospital for your procedure. Both you and your visitor must wear a mask once you enter the hospital.    Reviewed procedure/mask/visitor instructions with patient.  Lab results 10/23/20 scanned to Epic under Lab tab.

## 2020-11-11 ENCOUNTER — Encounter (HOSPITAL_COMMUNITY)
Admission: RE | Disposition: A | Discharge status: Home / Self Care | Payer: Self-pay | Source: Home / Self Care | Attending: Interventional Cardiology

## 2020-11-11 ENCOUNTER — Telehealth: Payer: Self-pay | Admitting: Cardiology

## 2020-11-11 ENCOUNTER — Ambulatory Visit (HOSPITAL_COMMUNITY)
Admission: RE | Admit: 2020-11-11 | Discharge: 2020-11-11 | Disposition: A | Discharge status: Home / Self Care | Payer: No Typology Code available for payment source | Attending: Interventional Cardiology | Admitting: Interventional Cardiology

## 2020-11-11 DIAGNOSIS — Z79899 Other long term (current) drug therapy: Secondary | ICD-10-CM | POA: Diagnosis not present

## 2020-11-11 DIAGNOSIS — Z8249 Family history of ischemic heart disease and other diseases of the circulatory system: Secondary | ICD-10-CM | POA: Diagnosis not present

## 2020-11-11 DIAGNOSIS — K219 Gastro-esophageal reflux disease without esophagitis: Secondary | ICD-10-CM | POA: Diagnosis not present

## 2020-11-11 DIAGNOSIS — I2582 Chronic total occlusion of coronary artery: Secondary | ICD-10-CM | POA: Diagnosis not present

## 2020-11-11 DIAGNOSIS — K2270 Barrett's esophagus without dysplasia: Secondary | ICD-10-CM | POA: Diagnosis not present

## 2020-11-11 DIAGNOSIS — Z7982 Long term (current) use of aspirin: Secondary | ICD-10-CM | POA: Insufficient documentation

## 2020-11-11 DIAGNOSIS — E782 Mixed hyperlipidemia: Secondary | ICD-10-CM | POA: Diagnosis not present

## 2020-11-11 DIAGNOSIS — Z955 Presence of coronary angioplasty implant and graft: Secondary | ICD-10-CM | POA: Diagnosis not present

## 2020-11-11 DIAGNOSIS — I200 Unstable angina: Secondary | ICD-10-CM

## 2020-11-11 DIAGNOSIS — Z87891 Personal history of nicotine dependence: Secondary | ICD-10-CM | POA: Insufficient documentation

## 2020-11-11 DIAGNOSIS — I25110 Atherosclerotic heart disease of native coronary artery with unstable angina pectoris: Secondary | ICD-10-CM | POA: Diagnosis not present

## 2020-11-11 HISTORY — PX: LEFT HEART CATH AND CORONARY ANGIOGRAPHY: CATH118249

## 2020-11-11 SURGERY — LEFT HEART CATH AND CORONARY ANGIOGRAPHY
Anesthesia: LOCAL

## 2020-11-11 MED ORDER — SODIUM CHLORIDE 0.9% FLUSH: 3.0000 mL | INTRAVENOUS | Status: DC | PRN

## 2020-11-11 MED ORDER — ACETAMINOPHEN 325 MG PO TABS: 650.0000 mg | ORAL_TABLET | ORAL | Status: DC | PRN

## 2020-11-11 MED ORDER — IOHEXOL 350 MG/ML SOLN
INTRAVENOUS | Status: DC | PRN
  Administered 2020-11-11: 55 mL via INTRA_ARTERIAL

## 2020-11-11 MED ORDER — FENTANYL CITRATE (PF) 100 MCG/2ML IJ SOLN
INTRAMUSCULAR | Status: DC | PRN
  Administered 2020-11-11 (×2): 25 ug via INTRAVENOUS

## 2020-11-11 MED ORDER — SODIUM CHLORIDE 0.9% FLUSH: 3.0000 mL | Freq: Two times a day (BID) | INTRAVENOUS | Status: DC

## 2020-11-11 MED ORDER — LABETALOL HCL 5 MG/ML IV SOLN: 10.0000 mg | INTRAVENOUS | Status: DC | PRN

## 2020-11-11 MED ORDER — SODIUM CHLORIDE 0.9 % IV SOLN: INTRAVENOUS | Status: AC

## 2020-11-11 MED ORDER — ONDANSETRON HCL 4 MG/2ML IJ SOLN: 4.0000 mg | Freq: Four times a day (QID) | INTRAMUSCULAR | Status: DC | PRN

## 2020-11-11 MED ORDER — MIDAZOLAM HCL 2 MG/2ML IJ SOLN
INTRAMUSCULAR | Status: AC
  Filled 2020-11-11: qty 2

## 2020-11-11 MED ORDER — SODIUM CHLORIDE 0.9 % IV SOLN: 250.0000 mL | INTRAVENOUS | Status: DC | PRN

## 2020-11-11 MED ORDER — HYDRALAZINE HCL 20 MG/ML IJ SOLN: 10.0000 mg | INTRAMUSCULAR | Status: DC | PRN

## 2020-11-11 MED ORDER — LIDOCAINE HCL (PF) 1 % IJ SOLN
INTRAMUSCULAR | Status: AC
  Filled 2020-11-11: qty 30

## 2020-11-11 MED ORDER — VERAPAMIL HCL 2.5 MG/ML IV SOLN
INTRAVENOUS | Status: AC
  Filled 2020-11-11: qty 2

## 2020-11-11 MED ORDER — ASPIRIN 81 MG PO CHEW: 81.0000 mg | CHEWABLE_TABLET | ORAL | Status: DC

## 2020-11-11 MED ORDER — HEPARIN (PORCINE) IN NACL 1000-0.9 UT/500ML-% IV SOLN
INTRAVENOUS | Status: DC | PRN
  Administered 2020-11-11 (×2): 500 mL

## 2020-11-11 MED ORDER — FENTANYL CITRATE (PF) 100 MCG/2ML IJ SOLN
INTRAMUSCULAR | Status: AC
  Filled 2020-11-11: qty 2

## 2020-11-11 MED ORDER — MIDAZOLAM HCL 2 MG/2ML IJ SOLN
INTRAMUSCULAR | Status: DC | PRN
  Administered 2020-11-11: 1 mg via INTRAVENOUS
  Administered 2020-11-11: 2 mg via INTRAVENOUS

## 2020-11-11 MED ORDER — LIDOCAINE HCL (PF) 1 % IJ SOLN
INTRAMUSCULAR | Status: DC | PRN
  Administered 2020-11-11: 2 mL via INTRADERMAL

## 2020-11-11 MED ORDER — HEPARIN (PORCINE) IN NACL 1000-0.9 UT/500ML-% IV SOLN
INTRAVENOUS | Status: AC
  Filled 2020-11-11: qty 1000

## 2020-11-11 MED ORDER — VERAPAMIL HCL 2.5 MG/ML IV SOLN
INTRAVENOUS | Status: DC | PRN
  Administered 2020-11-11: 10 mL via INTRA_ARTERIAL

## 2020-11-11 MED ORDER — HEPARIN SODIUM (PORCINE) 1000 UNIT/ML IJ SOLN
INTRAMUSCULAR | Status: DC | PRN
  Administered 2020-11-11: 4500 [IU] via INTRAVENOUS

## 2020-11-11 MED ORDER — SODIUM CHLORIDE 0.9 % WEIGHT BASED INFUSION: 3.0000 mL/kg/h | INTRAVENOUS | Status: AC

## 2020-11-11 MED ORDER — HEPARIN SODIUM (PORCINE) 1000 UNIT/ML IJ SOLN
INTRAMUSCULAR | Status: AC
  Filled 2020-11-11: qty 1

## 2020-11-11 MED ORDER — SODIUM CHLORIDE 0.9 % WEIGHT BASED INFUSION: 1.0000 mL/kg/h | INTRAVENOUS | Status: DC

## 2020-11-11 SURGICAL SUPPLY — 10 items
CATH 5FR JL3.5 JR4 ANG PIG MP (CATHETERS) ×1 IMPLANT
DEVICE RAD COMP TR BAND LRG (VASCULAR PRODUCTS) ×1 IMPLANT
GLIDESHEATH SLEND SS 6F .021 (SHEATH) ×1 IMPLANT
GUIDEWIRE INQWIRE 1.5J.035X260 (WIRE) IMPLANT
INQWIRE 1.5J .035X260CM (WIRE) ×4
KIT HEART LEFT (KITS) ×2 IMPLANT
PACK CARDIAC CATHETERIZATION (CUSTOM PROCEDURE TRAY) ×2 IMPLANT
SHEATH PROBE COVER 6X72 (BAG) ×1 IMPLANT
TRANSDUCER W/STOPCOCK (MISCELLANEOUS) ×2 IMPLANT
TUBING CIL FLEX 10 FLL-RA (TUBING) ×2 IMPLANT

## 2020-11-11 NOTE — Progress Notes (Signed)
TCTS consulted for outpatient CABG evaluation. TCTS office will call patient with an appointment. 

## 2020-11-11 NOTE — Telephone Encounter (Signed)
Wife of the patient called. She was told that the patient will need a referral to another surgeon to have bypass surgery. The wife was told this today after the patient was done with his heart cath.  The wife wanted to get the process started asap. She does not want to wait until his appointment in April with Dr. Domenic Polite.

## 2020-11-11 NOTE — Progress Notes (Signed)
Arm board applied to right arm

## 2020-11-11 NOTE — Telephone Encounter (Signed)
Pt is currently still admitted (post cath) pt wife aware that upon discharge providers would make referrals based on wheat was needed - wife voiced understanding

## 2020-11-11 NOTE — Interval H&P Note (Signed)
Cath Lab Visit (complete for each Cath Lab visit)  Clinical Evaluation Leading to the Procedure:   ACS: No.  Non-ACS:    Anginal Classification: CCS III  Anti-ischemic medical therapy: Minimal Therapy (1 class of medications)  Non-Invasive Test Results: No non-invasive testing performed  Prior CABG: No previous CABG      History and Physical Interval Note:  11/11/2020 2:25 PM  [name]  has presented today for surgery, with the diagnosis of accelerating exertional angina.  The various methods of treatment have been discussed with the patient and family. After consideration of risks, benefits and other options for treatment, the patient has consented to  Procedure(s): LEFT HEART CATH AND CORONARY ANGIOGRAPHY (N/A) as a surgical intervention.  The patient's history has been reviewed, patient examined, no change in status, stable for surgery.  I have reviewed the patient's chart and labs.  Questions were answered to the patient's satisfaction.     Larae Grooms

## 2020-11-11 NOTE — Telephone Encounter (Signed)
See previous note

## 2020-11-11 NOTE — Telephone Encounter (Signed)
Pt's wife called stating that they were unable to complete is cath today due to blockages being 100%, so they're referring him out to have open heart surgery.  Pt's wife is very worried and would like to get this referral done ASAP.

## 2020-11-11 NOTE — Discharge Instructions (Signed)
Radial Site Care  This sheet gives you information about how to care for yourself after your procedure. Your health care provider may also give you more specific instructions. If you have problems or questions, contact your health care provider. What can I expect after the procedure? After the procedure, it is common to have:  Bruising and tenderness at the catheter insertion area. Follow these instructions at home: Medicines  Take over-the-counter and prescription medicines only as told by your health care provider. Insertion site care 1. Follow instructions from your health care provider about how to take care of your insertion site. Make sure you: ? Wash your hands with soap and water before you remove your bandage (dressing). If soap and water are not available, use hand sanitizer. ? May remove dressing in 24 hours. 2. Check your insertion site every day for signs of infection. Check for: ? Redness, swelling, or pain. ? Fluid or blood. ? Pus or a bad smell. ? Warmth. 3. Do no take baths, swim, or use a hot tub for 5 days. 4. You may shower 24-48 hours after the procedure. ? Remove the dressing and gently wash the site with plain soap and water. ? Pat the area dry with a clean towel. ? Do not rub the site. That could cause bleeding. 5. Do not apply powder or lotion to the site. Activity  1. For 24 hours after the procedure, or as directed by your health care provider: ? Do not flex or bend the affected arm. ? Do not push or pull heavy objects with the affected arm. ? Do not drive yourself home from the hospital or clinic. You may drive 24 hours after the procedure. ? Do not operate machinery or power tools. ? KEEP ARM ELEVATED THE REMAINDER OF THE DAY. 2. Do not push, pull or lift anything that is heavier than 10 lb for 5 days. 3. Ask your health care provider when it is okay to: ? Return to work or school. ? Resume usual physical activities or sports. ? Resume sexual  activity. General instructions  If the catheter site starts to bleed, raise your arm and put firm pressure on the site. If the bleeding does not stop, get help right away. This is a medical emergency.  DRINK PLENTY OF FLUIDS FOR THE NEXT 2-3 DAYS.  No alcohol consumption for 24 hours after receiving sedation.  If you went home on the same day as your procedure, a responsible adult should be with you for the first 24 hours after you arrive home.  Keep all follow-up visits as told by your health care provider. This is important. Contact a health care provider if:  You have a fever.  You have redness, swelling, or yellow drainage around your insertion site. Get help right away if:  You have unusual pain at the radial site.  The catheter insertion area swells very fast.  The insertion area is bleeding, and the bleeding does not stop when you hold steady pressure on the area.  Your arm or hand becomes pale, cool, tingly, or numb. These symptoms may represent a serious problem that is an emergency. Do not wait to see if the symptoms will go away. Get medical help right away. Call your local emergency services (911 in the [place] Do not drive yourself to the hospital. Summary  After the procedure, it is common to have bruising and tenderness at the site.  Follow instructions from your health care provider about how to take care   of your radial site wound. Check the wound every day for signs of infection.  This information is not intended to replace advice given to you by your health care provider. Make sure you discuss any questions you have with your health care provider. Document Revised: 09/27/2017 Document Reviewed: 09/27/2017 Elsevier Patient Education  2020 Elsevier Inc. 

## 2020-11-12 ENCOUNTER — Encounter (HOSPITAL_COMMUNITY): Payer: Self-pay | Admitting: Interventional Cardiology

## 2020-11-19 ENCOUNTER — Other Ambulatory Visit: Payer: Self-pay | Admitting: Thoracic Surgery (Cardiothoracic Vascular Surgery)

## 2020-11-19 ENCOUNTER — Encounter: Payer: Self-pay | Admitting: Thoracic Surgery (Cardiothoracic Vascular Surgery)

## 2020-11-19 ENCOUNTER — Institutional Professional Consult (permissible substitution) (INDEPENDENT_AMBULATORY_CARE_PROVIDER_SITE_OTHER): Payer: No Typology Code available for payment source | Admitting: Thoracic Surgery (Cardiothoracic Vascular Surgery)

## 2020-11-19 ENCOUNTER — Telehealth: Payer: Self-pay | Admitting: Cardiology

## 2020-11-19 ENCOUNTER — Other Ambulatory Visit: Payer: Self-pay

## 2020-11-19 VITALS — BP 132/74 | HR 69 | Resp 20 | Ht 70.0 in | Wt 196.0 lb

## 2020-11-19 DIAGNOSIS — I2510 Atherosclerotic heart disease of native coronary artery without angina pectoris: Secondary | ICD-10-CM | POA: Diagnosis not present

## 2020-11-19 NOTE — H&P (View-Only) (Signed)
[address]       [place] [address]             [id number]        [place] J Fl[place] Medical Record #[id number] Date of Birth: 1951-06-04  Referring: Jettie Booze, MD Primary Care: Myrla Halsted, MD Primary Cardiologist:Samuel Domenic Polite, MD  Chief Complaint:    Chief Complaint  Patient presents with  . Coronary Artery Disease    Surgical consult, Cardiac Cath 11/11/20    History of Present Illness:     70 year old gentleman referred by Dr. Irish Lack for surgical evaluation of LAD in-stent restenosis.  He has a long history of coronary artery disease and has undergone PCI to the LAD on 3 separate occasions.  Over the last 2 years she has had exertional chest pain and dyspnea that is relieved with his nitroglycerin.  He has been somewhat confused about his symptoms given his history of gastroesophageal reflux, but he does endorse relief of his symptoms with sublingual nitroglycerin.  He remains active and is still able to play golf and work in the yard without many symptoms.  He is unable to lay flat due to his reflux.  He denies any lower extremity swelling.   Past Medical and Surgical History: Previous Chest Surgery: No Previous Chest Radiation: No Diabetes Mellitus: No.  HbA1C pending Creatinine: Pending  Past Medical History:  Diagnosis Date  . Barrett's esophagus   . Coronary artery disease    ACS in 02/2006: DES to subtotal LAD; 50% D1; 30% CX; normal RCA; normal EF;repeat cath in 06/2006: Residual 80% small D1 and 70% D2-medical therapy advised  . GERD (gastroesophageal reflux disease)   . History of pneumonia   . Hyperlipidemia   . Low back pain     Past Surgical History:  Procedure Laterality Date  . CARDIAC CATHETERIZATION    . CORONARY ANGIOPLASTY WITH STENT PLACEMENT  02/17/2012    proximal LAD  . ESOPHAGOGASTRODUODENOSCOPY N/A 07/24/2014   Procedure: ESOPHAGOGASTRODUODENOSCOPY (EGD);  Surgeon: Rogene Houston, MD;   [facility];  Service: Endoscopy;  Laterality: N/A;  210  . LEFT HEART CATH AND CORONARY ANGIOGRAPHY N/A 11/11/2020   Procedure: LEFT HEART CATH AND CORONARY ANGIOGRAPHY;  Surgeon: Jettie Booze, MD;  Location: [place] CV LAB;  Service: Cardiovascular;  Laterality: N/A;  . MALONEY DILATION N/A 07/24/2014   Procedure: Venia Minks DILATION;  Surgeon: Rogene Houston, MD;  [facility];  Service: Endoscopy;  Laterality: N/A;  . PERCUTANEOUS CORONARY STENT INTERVENTION (PCI-S) N/A 02/17/2012   Procedure: PERCUTANEOUS CORONARY STENT INTERVENTION (PCI-S);  Surgeon: Burnell Blanks, MD;  Location: [facility] CATH LAB;  Service: Cardiovascular;  Laterality: N/A;    Social History: Support: Lives with his wife.  Social History   Tobacco Use  Smoking Status Former Smoker  . Packs/day: 1.50  . Years: 35.00  . Pack years: 52.50  . Types: Cigarettes  . Quit date: 09/05/1998  . Years since quitting: 22.2  Smokeless Tobacco Never Used  Tobacco Comment   Patient quit May of 2000    Social History   Substance and Sexual Activity  Alcohol Use Yes   Comment: 3-4 daily     Allergies  Allergen Reactions  . Crestor [Rosuvastatin] Other (See Comments)    Myalgias; also Vytorin--  PT CAN SOMEWHAT TOLERATE CRESTOR 10 mg  . Isosorbide Mononitrate [Isosorbide Nitrate] Other (See Comments)    Unable to tolerate Imdur at  a dose of 60 mg secondary to malaise and headache  . Vytorin [Ezetimibe-Simvastatin] Other (See Comments)    Myalgias; also rosuvastatin     Medications: Asprin: Yes Statin: Yes Beta Blocker: Yes Ace Inhibitor: No Anti-Coagulation: No  Current Outpatient Medications  Medication Sig Dispense Refill  . allopurinol (ZYLOPRIM) 100 MG tablet Take 100 mg by mouth every evening.    Marland Kitchen aspirin EC 81 MG tablet Take 81 mg by mouth daily. Swallow whole.    . Coenzyme Q10 (COQ10) 100 MG CAPS Take 100 mg by mouth in the morning and at bedtime.    .  Cyanocobalamin (VITAMIN B-12) 2500 MCG SUBL Place 2,500 mcg under the tongue daily after supper.    . esomeprazole (NEXIUM) 40 MG capsule Take 1 capsule (40 mg total) by mouth daily before breakfast. 30 capsule 1  . losartan (COZAAR) 25 MG tablet Take 1 tablet (25 mg total) by mouth daily. (Patient taking differently: Take 25 mg by mouth every evening.) 90 tablet 3  . Magnesium 250 MG TABS Take 250 mg by mouth daily.    . metoprolol succinate (TOPROL-XL) 50 MG 24 hr tablet TAKE ONE TABLET BY MOUTH ONCE DAILY. TAKE WITH OR IMMEDIATELY FOLLOWING A MEAL (Patient taking differently: Take 50 mg by mouth daily.) 90 tablet 3  . nitroGLYCERIN (NITROSTAT) 0.4 MG SL tablet Place 1 tablet (0.4 mg total) under the tongue every 5 (five) minutes as needed for chest pain (up to 3 doses). (Patient taking differently: Place 0.4 mg under the tongue every 5 (five) minutes x 3 doses as needed for chest pain (up to 3 doses).) 25 tablet 3  . Omega-3 Fatty Acids (FISH OIL) 1000 MG CAPS Take 1,000 mg by mouth in the morning and at bedtime.    . polyethylene glycol (MIRALAX / GLYCOLAX) 17 g packet Take 17 g by mouth daily as needed (constipation).    . Polyethylene Glycol 400 (BLINK TEARS) 0.25 % SOLN Place 1-2 drops into both eyes 3 (three) times daily as needed (dry/irritated eyes.).    Marland Kitchen ranolazine (RANEXA) 1000 MG SR tablet Take 1 tablet (1,000 mg total) by mouth 2 (two) times daily. 60 tablet 3  . rosuvastatin (CRESTOR) 20 MG tablet Take 1 tablet (20 mg total) by mouth daily. (Patient taking differently: Take 20 mg by mouth every evening.) 90 tablet 3   Current Facility-Administered Medications  Medication Dose Route Frequency Provider Last Rate Last Admin  . sodium chloride flush (NS) 0.9 % injection 3 mL  3 mL Intravenous Q12H Satira Sark, MD        (Not in a hospital admission)   Family History  Problem Relation Age of Onset  . Hyperlipidemia Mother   . Hyperlipidemia Father   . Heart disease Sister    . Heart disease Son      Review of Systems:   Review of Systems  Constitutional: Negative.   Respiratory: Positive for shortness of breath.   Cardiovascular: Positive for chest pain. Negative for palpitations.  Gastrointestinal: Positive for abdominal pain, heartburn and nausea.  Neurological: Negative.       Physical Exam: BP 132/74 (BP Location: Right Arm, Patient Position: Sitting)   Pulse 69   Resp 20   Ht 5\' 10"  (1.778 m)   Wt 196 lb (88.9 kg)   SpO2 97% Comment: RA  BMI 28.12 kg/m  Physical Exam Constitutional:      General: He is not in acute distress.    Appearance: Normal appearance. He  is normal weight.  Eyes:     Extraocular Movements: Extraocular movements intact.  Cardiovascular:     Rate and Rhythm: Normal rate.  Pulmonary:     Effort: Pulmonary effort is normal. No respiratory distress.  Musculoskeletal:        General: Normal range of motion.  Skin:    General: Skin is warm and dry.  Neurological:     General: No focal deficit present.     Mental Status: He is alert and oriented to person, place, and time.       Diagnostic Studies & Laboratory data:    Left Heart Catherization: Left heart cath was reviewed.  He does have multiple stents in his proximal to mid LAD with in-stent restenosis.  He does not have any flow in his LAD.  It does fill from right to left collaterals.  He has a nonobstructive blockage approximately 25% in the mid RCA.  There is no circumflex disease.  Echo: Pending   I have independently reviewed the above radiologic studies and discussed with the patient   Recent Lab Findings: Lab Results  Component Value Date   WBC 3.9 (L) 03/21/2012   HGB 13.5 03/21/2012   HCT 38.7 (L) 03/21/2012   PLT 146 (L) 03/21/2012   GLUCOSE 84 09/11/2013   CHOL 185 01/30/2013   TRIG 261 (H) 01/30/2013   HDL 46 01/30/2013   LDLDIRECT 120 (H) 01/03/2013   LDLCALC 87 01/30/2013   ALT 27 01/30/2013   AST 23 01/30/2013   NA 140  09/11/2013   K 4.5 09/11/2013   CL 101 09/11/2013   CREATININE 1.03 09/11/2013   BUN 13 09/11/2013   CO2 28 09/11/2013   INR 1.03 03/21/2012      Assessment / Plan:   70 year old male with totally occluded LAD.  He does have a history of multiple PCI's to the LAD.  He will require another echocardiogram for further evaluation of his valves and ventricular function.  We discussed multiple options for surgical revascularization, and given that he only has single-vessel disease he has elected to proceed with a robotic assisted LIMA harvest with minimally invasive LIMA to LAD bypass.  The risks and benefits have been discussed and is agreeable to proceed.  He is tentatively scheduled for the next 2 weeks.     I  spent 40 minutes counseling the patient face to face.   Lajuana Matte 11/19/2020 3:19 PM

## 2020-11-19 NOTE — Telephone Encounter (Signed)
TO: PAM RILEY  Just got a call from [facility]. Robey [phone] saying that he needs an Authorization for Dr. Kipp Brood. Is this something that we get for that doctor?   PER:  Jarold Song.Marland KitchenMarland KitchenMarland KitchenIf our doctor wants to send patient to another doctor for procedure ...Marland KitchenMarland KitchenMarland Kitchenour doctors nurse must send the request in with notes to the [place] primary M,D.  They will then decide if patient can have done by our doctors request or not.  This will need follow up for sure.  It sometimes takes weeks to get an answer back.  The patient should follow up as well to make sure it does not fall thru the cracks.  There are forms they will need to complete.    Thanks  Per Pam, we will need to contact [place] in regards to this referral . We will need to ask [facility] if they can fax the proper form to be completed.

## 2020-11-19 NOTE — Progress Notes (Signed)
[address]       [place] [id number]             [phone]        [name] [place] Medical Record #[id number] Date of Birth: 1951/01/09  Referring: Jettie Booze, MD Primary Care: Myrla Halsted, MD Primary Cardiologist:Samuel Domenic Polite, MD  Chief Complaint:    Chief Complaint  Patient presents with  . Coronary Artery Disease    Surgical consult, Cardiac Cath 11/11/20    History of Present Illness:     70 year old gentleman referred by Dr. Irish Lack for surgical evaluation of LAD in-stent restenosis.  He has a long history of coronary artery disease and has undergone PCI to the LAD on 3 separate occasions.  Over the last 2 years she has had exertional chest pain and dyspnea that is relieved with his nitroglycerin.  He has been somewhat confused about his symptoms given his history of gastroesophageal reflux, but he does endorse relief of his symptoms with sublingual nitroglycerin.  He remains active and is still able to play golf and work in the yard without many symptoms.  He is unable to lay flat due to his reflux.  He denies any lower extremity swelling.   Past Medical and Surgical History: Previous Chest Surgery: No Previous Chest Radiation: No Diabetes Mellitus: No.  HbA1C pending Creatinine: Pending  Past Medical History:  Diagnosis Date  . Barrett's esophagus   . Coronary artery disease    ACS in 02/2006: DES to subtotal LAD; 50% D1; 30% CX; normal RCA; normal EF;repeat cath in 06/2006: Residual 80% small D1 and 70% D2-medical therapy advised  . GERD (gastroesophageal reflux disease)   . History of pneumonia   . Hyperlipidemia   . Low back pain     Past Surgical History:  Procedure Laterality Date  . CARDIAC CATHETERIZATION    . CORONARY ANGIOPLASTY WITH STENT PLACEMENT  02/17/2012    proximal LAD  . ESOPHAGOGASTRODUODENOSCOPY N/A 07/24/2014   Procedure: ESOPHAGOGASTRODUODENOSCOPY (EGD);  Surgeon: Rogene Houston, MD;   [facility];  Service: Endoscopy;  Laterality: N/A;  210  . LEFT HEART CATH AND CORONARY ANGIOGRAPHY N/A 11/11/2020   Procedure: LEFT HEART CATH AND CORONARY ANGIOGRAPHY;  Surgeon: Jettie Booze, MD;  Location: [facility] CV LAB;  Service: Cardiovascular;  Laterality: N/A;  . MALONEY DILATION N/A 07/24/2014   Procedure: Venia Minks DILATION;  Surgeon: Rogene Houston, MD;  Location: [facility];  Service: Endoscopy;  Laterality: N/A;  . PERCUTANEOUS CORONARY STENT INTERVENTION (PCI-S) N/A 02/17/2012   Procedure: PERCUTANEOUS CORONARY STENT INTERVENTION (PCI-S);  Surgeon: Burnell Blanks, MD;  Location: [facility] CATH LAB;  Service: Cardiovascular;  Laterality: N/A;    Social History: Support: Lives with his wife.  Social History   Tobacco Use  Smoking Status Former Smoker  . Packs/day: 1.50  . Years: 35.00  . Pack years: 52.50  . Types: Cigarettes  . Quit date: 09/05/1998  . Years since quitting: 22.2  Smokeless Tobacco Never Used  Tobacco Comment   Patient quit May of 2000    Social History   Substance and Sexual Activity  Alcohol Use Yes   Comment: 3-4 daily     Allergies  Allergen Reactions  . Crestor [Rosuvastatin] Other (See Comments)    Myalgias; also Vytorin--  PT CAN SOMEWHAT TOLERATE CRESTOR 10 mg  . Isosorbide Mononitrate [Isosorbide Nitrate] Other (See Comments)    Unable to tolerate Imdur at  a dose of 60 mg secondary to malaise and headache  . Vytorin [Ezetimibe-Simvastatin] Other (See Comments)    Myalgias; also rosuvastatin     Medications: Asprin: Yes Statin: Yes Beta Blocker: Yes Ace Inhibitor: No Anti-Coagulation: No  Current Outpatient Medications  Medication Sig Dispense Refill  . allopurinol (ZYLOPRIM) 100 MG tablet Take 100 mg by mouth every evening.    Marland Kitchen aspirin EC 81 MG tablet Take 81 mg by mouth daily. Swallow whole.    . Coenzyme Q10 (COQ10) 100 MG CAPS Take 100 mg by mouth in the morning and at bedtime.    .  Cyanocobalamin (VITAMIN B-12) 2500 MCG SUBL Place 2,500 mcg under the tongue daily after supper.    . esomeprazole (NEXIUM) 40 MG capsule Take 1 capsule (40 mg total) by mouth daily before breakfast. 30 capsule 1  . losartan (COZAAR) 25 MG tablet Take 1 tablet (25 mg total) by mouth daily. (Patient taking differently: Take 25 mg by mouth every evening.) 90 tablet 3  . Magnesium 250 MG TABS Take 250 mg by mouth daily.    . metoprolol succinate (TOPROL-XL) 50 MG 24 hr tablet TAKE ONE TABLET BY MOUTH ONCE DAILY. TAKE WITH OR IMMEDIATELY FOLLOWING A MEAL (Patient taking differently: Take 50 mg by mouth daily.) 90 tablet 3  . nitroGLYCERIN (NITROSTAT) 0.4 MG SL tablet Place 1 tablet (0.4 mg total) under the tongue every 5 (five) minutes as needed for chest pain (up to 3 doses). (Patient taking differently: Place 0.4 mg under the tongue every 5 (five) minutes x 3 doses as needed for chest pain (up to 3 doses).) 25 tablet 3  . Omega-3 Fatty Acids (FISH OIL) 1000 MG CAPS Take 1,000 mg by mouth in the morning and at bedtime.    . polyethylene glycol (MIRALAX / GLYCOLAX) 17 g packet Take 17 g by mouth daily as needed (constipation).    . Polyethylene Glycol 400 (BLINK TEARS) 0.25 % SOLN Place 1-2 drops into both eyes 3 (three) times daily as needed (dry/irritated eyes.).    Marland Kitchen ranolazine (RANEXA) 1000 MG SR tablet Take 1 tablet (1,000 mg total) by mouth 2 (two) times daily. 60 tablet 3  . rosuvastatin (CRESTOR) 20 MG tablet Take 1 tablet (20 mg total) by mouth daily. (Patient taking differently: Take 20 mg by mouth every evening.) 90 tablet 3   Current Facility-Administered Medications  Medication Dose Route Frequency Provider Last Rate Last Admin  . sodium chloride flush (NS) 0.9 % injection 3 mL  3 mL Intravenous Q12H Satira Sark, MD        (Not in a hospital admission)   Family History  Problem Relation Age of Onset  . Hyperlipidemia Mother   . Hyperlipidemia Father   . Heart disease Sister    . Heart disease Son      Review of Systems:   Review of Systems  Constitutional: Negative.   Respiratory: Positive for shortness of breath.   Cardiovascular: Positive for chest pain. Negative for palpitations.  Gastrointestinal: Positive for abdominal pain, heartburn and nausea.  Neurological: Negative.       Physical Exam: BP 132/74 (BP Location: Right Arm, Patient Position: Sitting)   Pulse 69   Resp 20   Ht 5\' 10"  (1.778 m)   Wt 196 lb (88.9 kg)   SpO2 97% Comment: RA  BMI 28.12 kg/m  Physical Exam Constitutional:      General: He is not in acute distress.    Appearance: Normal appearance. He  is normal weight.  Eyes:     Extraocular Movements: Extraocular movements intact.  Cardiovascular:     Rate and Rhythm: Normal rate.  Pulmonary:     Effort: Pulmonary effort is normal. No respiratory distress.  Musculoskeletal:        General: Normal range of motion.  Skin:    General: Skin is warm and dry.  Neurological:     General: No focal deficit present.     Mental Status: He is alert and oriented to person, place, and time.       Diagnostic Studies & Laboratory data:    Left Heart Catherization: Left heart cath was reviewed.  He does have multiple stents in his proximal to mid LAD with in-stent restenosis.  He does not have any flow in his LAD.  It does fill from right to left collaterals.  He has a nonobstructive blockage approximately 25% in the mid RCA.  There is no circumflex disease.  Echo: Pending   I have independently reviewed the above radiologic studies and discussed with the patient   Recent Lab Findings: Lab Results  Component Value Date   WBC 3.9 (L) 03/21/2012   HGB 13.5 03/21/2012   HCT 38.7 (L) 03/21/2012   PLT 146 (L) 03/21/2012   GLUCOSE 84 09/11/2013   CHOL 185 01/30/2013   TRIG 261 (H) 01/30/2013   HDL 46 01/30/2013   LDLDIRECT 120 (H) 01/03/2013   LDLCALC 87 01/30/2013   ALT 27 01/30/2013   AST 23 01/30/2013   NA 140  09/11/2013   K 4.5 09/11/2013   CL 101 09/11/2013   CREATININE 1.03 09/11/2013   BUN 13 09/11/2013   CO2 28 09/11/2013   INR 1.03 03/21/2012      Assessment / Plan:   70 year old male with totally occluded LAD.  He does have a history of multiple PCI's to the LAD.  He will require another echocardiogram for further evaluation of his valves and ventricular function.  We discussed multiple options for surgical revascularization, and given that he only has single-vessel disease he has elected to proceed with a robotic assisted LIMA harvest with minimally invasive LIMA to LAD bypass.  The risks and benefits have been discussed and is agreeable to proceed.  He is tentatively scheduled for the next 2 weeks.     I  spent 40 minutes counseling the patient face to face.   Lajuana Matte 11/19/2020 3:19 PM

## 2020-11-23 NOTE — Telephone Encounter (Signed)
Per Pam:  We need to send this request to Dr. Hassell Done nurse. Will forward to [place], RN

## 2020-11-23 NOTE — Telephone Encounter (Signed)
[facility], spoke with Rip Harbour in [place] at [facility] to check on authorization, and was advised that Ellouise Newer is handling this request (ext [phone]). Rip Harbour will give message to Bethena Roys to call office to discuss.

## 2020-11-26 ENCOUNTER — Ambulatory Visit (HOSPITAL_COMMUNITY)
Admission: RE | Admit: 2020-11-26 | Discharge: 2020-11-26 | Disposition: A | Discharge status: Home / Self Care | Payer: No Typology Code available for payment source | Source: Ambulatory Visit | Attending: Thoracic Surgery (Cardiothoracic Vascular Surgery) | Admitting: Thoracic Surgery (Cardiothoracic Vascular Surgery)

## 2020-11-26 ENCOUNTER — Other Ambulatory Visit: Payer: Self-pay

## 2020-11-26 DIAGNOSIS — I25700 Atherosclerosis of coronary artery bypass graft(s), unspecified, with unstable angina pectoris: Secondary | ICD-10-CM | POA: Insufficient documentation

## 2020-11-26 DIAGNOSIS — I119 Hypertensive heart disease without heart failure: Secondary | ICD-10-CM | POA: Diagnosis not present

## 2020-11-26 DIAGNOSIS — E785 Hyperlipidemia, unspecified: Secondary | ICD-10-CM | POA: Diagnosis not present

## 2020-11-26 DIAGNOSIS — I2510 Atherosclerotic heart disease of native coronary artery without angina pectoris: Secondary | ICD-10-CM

## 2020-11-26 LAB — ECHOCARDIOGRAM COMPLETE
Area-P 1/2: 3.76 cm2
Calc EF: 67 %
Radius: 0.4 cm
S' Lateral: 2.6 cm
Single Plane A2C EF: 72.4 %
Single Plane A4C EF: 60.3 %

## 2020-11-26 NOTE — Progress Notes (Signed)
  Echocardiogram 2D Echocardiogram has been performed.  [name] 11/26/2020, 1:51 PM

## 2020-12-07 ENCOUNTER — Encounter: Payer: Self-pay | Admitting: *Deleted

## 2020-12-07 ENCOUNTER — Other Ambulatory Visit: Payer: Self-pay | Admitting: *Deleted

## 2020-12-07 DIAGNOSIS — I2510 Atherosclerotic heart disease of native coronary artery without angina pectoris: Secondary | ICD-10-CM

## 2020-12-15 NOTE — Pre-Procedure Instructions (Signed)
[name]  12/15/2020     Your procedure is scheduled on Fri., December 18, 2020 from 7:30AM-1:30PM  Report to [facility] Entrance "A" at 5:30AM  Call this number if you have problems the morning of surgery:  [phone]   Remember:  Do not eat or drink after midnight on April 14th    Take these medicines the morning of surgery with A SIP OF WATER: Esomeprazole (NEXIUM) Loratadine (CLARITIN) Metoprolol succinate (TOPROL-XL) Ranolazine (RANEXA) Rosuvastatin (CRESTOR  If Needed: Allopurinol (ZYLOPRIM) NitroGLYCERIN (NITROSTAT)  As of today, STOP taking all Aspirin (Unless instructed by your doctor) and Other Aspirin containing products, Vitamins, Fish oils, and Herbal medications. Also stop all NSAIDS i.e. Advil, Ibuprofen, Motrin, Aleve, Anaprox, Naproxen, BC, Goody Powders, and all Supplements.   No Smoking of any kind, Tobacco/Vaping, or Alcohol products 24 hours prior to your procedure. If you use a Cpap at night, you may bring all equipment for your overnight stay.   Day of Surgery:  Do not wear jewelry.  Do not wear lotions, powders, colognes, or deodorant.  Do not shave 48 hours prior to surgery.  Men may shave face and neck.  Do not bring valuables to the hospital.  [facility] is not responsible for any belongings or valuables.  Contacts, dentures or bridgework may not be worn into surgery.    For patients admitted to the hospital, discharge time will be determined by your treatment team.  Patients discharged the day of surgery will not be allowed to drive home, and someone age 60 and over needs to stay with them for 24 hours.   Special instructions:  [place]- Preparing For Surgery  Before surgery, you can play an important role. Because skin is not sterile, your skin needs to be as free of germs as possible. You can reduce the number of germs on your skin by washing with CHG (chlorahexidine gluconate) Soap before surgery.  CHG is an  antiseptic cleaner which kills germs and bonds with the skin to continue killing germs even after washing.    Oral Hygiene is also important to reduce your risk of infection.  Remember - BRUSH YOUR TEETH THE MORNING OF SURGERY WITH YOUR REGULAR TOOTHPASTE  Please do not use if you have an allergy to CHG or antibacterial soaps. If your skin becomes reddened/irritated stop using the CHG.  Do not shave (including legs and underarms) for at least 48 hours prior to first CHG shower. It is OK to shave your face.  Please follow these instructions carefully.   1. Shower the NIGHT BEFORE SURGERY and the MORNING OF SURGERY with CHG.   2. If you chose to wash your hair, wash your hair first as usual with your normal shampoo.  3. After you shampoo, rinse your hair and body thoroughly to remove the shampoo.  4. Use CHG as you would any other liquid soap. You can apply CHG directly to the skin and wash gently with a scrungie or a clean washcloth.   5. Apply the CHG Soap to your body ONLY FROM THE NECK DOWN.  Do not use on open wounds or open sores. Avoid contact with your eyes, ears, mouth and genitals (private parts). Wash Face and genitals (private parts)  with your normal soap.  6. Wash thoroughly, paying special attention to the area where your surgery will be performed.  7. Thoroughly rinse your body with warm water from the neck down.  8. DO NOT shower/wash with your normal  soap after using and rinsing off the CHG Soap.  9. Pat yourself dry with a CLEAN TOWEL.  10. Wear CLEAN PAJAMAS to bed the night before surgery, wear comfortable clothes the morning of surgery  11. Place CLEAN SHEETS on your bed the night of your first shower and DO NOT SLEEP WITH PETS.  Reminders: Do not apply any deodorants/lotions.  Please wear clean clothes to the hospital/surgery center.   Remember to brush your teeth WITH YOUR REGULAR TOOTHPASTE.  Please read over the following fact sheets that you were  given.

## 2020-12-16 ENCOUNTER — Other Ambulatory Visit: Payer: Self-pay

## 2020-12-16 ENCOUNTER — Other Ambulatory Visit (HOSPITAL_COMMUNITY)
Admission: RE | Admit: 2020-12-16 | Discharge: 2020-12-16 | Disposition: A | Discharge status: Home / Self Care | Payer: No Typology Code available for payment source | Source: Ambulatory Visit | Attending: Thoracic Surgery (Cardiothoracic Vascular Surgery) | Admitting: Thoracic Surgery (Cardiothoracic Vascular Surgery)

## 2020-12-16 ENCOUNTER — Ambulatory Visit (HOSPITAL_COMMUNITY)
Admission: RE | Admit: 2020-12-16 | Discharge: 2020-12-16 | Disposition: A | Discharge status: Home / Self Care | Payer: No Typology Code available for payment source | Source: Ambulatory Visit | Attending: Thoracic Surgery (Cardiothoracic Vascular Surgery) | Admitting: Thoracic Surgery (Cardiothoracic Vascular Surgery)

## 2020-12-16 ENCOUNTER — Encounter (HOSPITAL_COMMUNITY)
Admission: RE | Admit: 2020-12-16 | Discharge: 2020-12-16 | Disposition: A | Discharge status: Home / Self Care | Payer: No Typology Code available for payment source | Source: Ambulatory Visit | Attending: Thoracic Surgery (Cardiothoracic Vascular Surgery) | Admitting: Thoracic Surgery (Cardiothoracic Vascular Surgery)

## 2020-12-16 ENCOUNTER — Encounter (HOSPITAL_COMMUNITY): Payer: Self-pay

## 2020-12-16 DIAGNOSIS — Z01818 Encounter for other preprocedural examination: Secondary | ICD-10-CM

## 2020-12-16 DIAGNOSIS — I2510 Atherosclerotic heart disease of native coronary artery without angina pectoris: Secondary | ICD-10-CM

## 2020-12-16 HISTORY — DX: Essential (primary) hypertension: I10

## 2020-12-16 HISTORY — DX: Gout, unspecified: M10.9

## 2020-12-16 HISTORY — DX: Personal history of other diseases of the digestive system: Z87.19

## 2020-12-16 LAB — COMPREHENSIVE METABOLIC PANEL
ALT: 27 U/L (ref 0–44)
AST: 23 U/L (ref 15–41)
Albumin: 4.3 g/dL (ref 3.5–5.0)
Alkaline Phosphatase: 49 U/L (ref 38–126)
Anion gap: 10 (ref 5–15)
BUN: 17 mg/dL (ref 8–23)
CO2: 21 mmol/L — ABNORMAL LOW (ref 22–32)
Calcium: 9.2 mg/dL (ref 8.9–10.3)
Chloride: 100 mmol/L (ref 98–111)
Creatinine, Ser: 0.94 mg/dL (ref 0.61–1.24)
GFR, Estimated: >60 mL/min (ref >60)
Glucose, Bld: 150 mg/dL — ABNORMAL HIGH (ref 70–99)
Potassium: 3.9 mmol/L (ref 3.5–5.1)
Sodium: 131 mmol/L — ABNORMAL LOW (ref 135–145)
Total Bilirubin: 1.3 mg/dL — ABNORMAL HIGH (ref 0.3–1.2)
Total Protein: 6.5 g/dL (ref 6.5–8.1)

## 2020-12-16 LAB — BLOOD GAS, ARTERIAL
Acid-base deficit: 0.9 mmol/L (ref 0.0–2.0)
Bicarbonate: 23 mmol/L (ref 20.0–28.0)
Drawn by: 58793
FIO2: 21
O2 Saturation: 96 %
Patient temperature: 37
pCO2 arterial: 36.2 mmHg (ref 32.0–48.0)
pH, Arterial: 7.419 (ref 7.350–7.450)
pO2, Arterial: 114 mmHg — ABNORMAL HIGH (ref 83.0–108.0)

## 2020-12-16 LAB — TYPE AND SCREEN
ABO/RH(D): O POS
Antibody Screen: NEGATIVE

## 2020-12-16 LAB — HEMOGLOBIN A1C
Hgb A1c MFr Bld: 4.7 % — ABNORMAL LOW (ref 4.8–5.6)
Mean Plasma Glucose: 88.19 mg/dL

## 2020-12-16 LAB — URINALYSIS, ROUTINE W REFLEX MICROSCOPIC
Bilirubin Urine: NEGATIVE
Glucose, UA: NEGATIVE mg/dL
Hgb urine dipstick: NEGATIVE
Ketones, ur: NEGATIVE mg/dL
Leukocytes,Ua: NEGATIVE
Nitrite: NEGATIVE
Protein, ur: NEGATIVE mg/dL
Specific Gravity, Urine: 1.013 (ref 1.005–1.030)
pH: 5 (ref 5.0–8.0)

## 2020-12-16 LAB — CBC
HCT: 38.4 % — ABNORMAL LOW (ref 39.0–52.0)
Hemoglobin: 13.4 g/dL (ref 13.0–17.0)
MCH: 33.8 pg (ref 26.0–34.0)
MCHC: 34.9 g/dL (ref 30.0–36.0)
MCV: 96.7 fL (ref 80.0–100.0)
Platelets: 115 10*3/uL — ABNORMAL LOW (ref 150–400)
RBC: 3.97 MIL/uL — ABNORMAL LOW (ref 4.22–5.81)
RDW: 11.6 % (ref 11.5–15.5)
WBC: 5.4 10*3/uL (ref 4.0–10.5)
nRBC: 0 % (ref 0.0–0.2)

## 2020-12-16 LAB — APTT: aPTT: 25 seconds (ref 24–36)

## 2020-12-16 LAB — SURGICAL PCR SCREEN
MRSA, PCR: NEGATIVE
Staphylococcus aureus: NEGATIVE

## 2020-12-16 LAB — PROTIME-INR
INR: 1 (ref 0.8–1.2)
Prothrombin Time: 13.1 seconds (ref 11.4–15.2)

## 2020-12-16 NOTE — Progress Notes (Signed)
PCP - Angelina Sheriff, [place] Cardiologist - Dr. Rozann Lesches  PPM/ICD - n/a Device Orders -  Rep Notified -   Chest x-ray - 12/16/20 EKG - 12/16/20 Stress Test - 09/18/2018 ECHO - 11/26/2020 Cardiac Cath - 11/11/2020  Sleep Study - n/a, stop-bang negative CPAP -   Fasting Blood Sugar - n/a Checks Blood Sugar _____ times a day  Blood Thinner Instructions: n/a Aspirin Instructions: per patient, told by Dr. Abran Duke office to stop, last dose 12/16/2020  ERAS Protcol - n/a PRE-SURGERY Ensure or G2-   COVID TEST- 12/16/2020   Anesthesia review: cardiac history  Patient denies shortness of breath, fever, cough and chest pain at PAT appointment   All instructions explained to the patient, with a verbal understanding of the material. Patient agrees to go over the instructions while at home for a better understanding. Patient also instructed to self quarantine after being tested for COVID-19. The opportunity to ask questions was provided.

## 2020-12-17 ENCOUNTER — Encounter (HOSPITAL_COMMUNITY): Payer: Self-pay | Admitting: Thoracic Surgery (Cardiothoracic Vascular Surgery)

## 2020-12-17 LAB — SARS CORONAVIRUS 2 (TAT 6-24 HRS): SARS Coronavirus 2: NEGATIVE

## 2020-12-18 ENCOUNTER — Other Ambulatory Visit: Payer: Self-pay

## 2020-12-18 ENCOUNTER — Inpatient Hospital Stay (HOSPITAL_COMMUNITY): Payer: No Typology Code available for payment source

## 2020-12-18 ENCOUNTER — Inpatient Hospital Stay (HOSPITAL_COMMUNITY): Payer: No Typology Code available for payment source | Admitting: Anesthesiology

## 2020-12-18 ENCOUNTER — Encounter (HOSPITAL_COMMUNITY)
Admission: RE | Disposition: A | Discharge status: Home / Self Care | Payer: Self-pay | Source: Home / Self Care | Attending: Thoracic Surgery (Cardiothoracic Vascular Surgery)

## 2020-12-18 ENCOUNTER — Encounter (HOSPITAL_COMMUNITY): Payer: Self-pay | Admitting: Thoracic Surgery (Cardiothoracic Vascular Surgery)

## 2020-12-18 ENCOUNTER — Inpatient Hospital Stay (HOSPITAL_COMMUNITY)
Admission: RE | Admit: 2020-12-18 | Discharge: 2020-12-21 | DRG: 236 | Disposition: A | Discharge status: Home / Self Care | Payer: No Typology Code available for payment source | Attending: Thoracic Surgery (Cardiothoracic Vascular Surgery) | Admitting: Thoracic Surgery (Cardiothoracic Vascular Surgery)

## 2020-12-18 DIAGNOSIS — E8770 Fluid overload, unspecified: Secondary | ICD-10-CM | POA: Diagnosis not present

## 2020-12-18 DIAGNOSIS — I10 Essential (primary) hypertension: Secondary | ICD-10-CM | POA: Diagnosis present

## 2020-12-18 DIAGNOSIS — E785 Hyperlipidemia, unspecified: Secondary | ICD-10-CM | POA: Diagnosis present

## 2020-12-18 DIAGNOSIS — D62 Acute posthemorrhagic anemia: Secondary | ICD-10-CM | POA: Diagnosis not present

## 2020-12-18 DIAGNOSIS — K2270 Barrett's esophagus without dysplasia: Secondary | ICD-10-CM | POA: Diagnosis present

## 2020-12-18 DIAGNOSIS — Y831 Surgical operation with implant of artificial internal device as the cause of abnormal reaction of the patient, or of later complication, without mention of misadventure at the time of the procedure: Secondary | ICD-10-CM | POA: Diagnosis present

## 2020-12-18 DIAGNOSIS — Z7982 Long term (current) use of aspirin: Secondary | ICD-10-CM | POA: Diagnosis not present

## 2020-12-18 DIAGNOSIS — Z79899 Other long term (current) drug therapy: Secondary | ICD-10-CM | POA: Diagnosis not present

## 2020-12-18 DIAGNOSIS — K5900 Constipation, unspecified: Secondary | ICD-10-CM | POA: Diagnosis not present

## 2020-12-18 DIAGNOSIS — Z20822 Contact with and (suspected) exposure to covid-19: Secondary | ICD-10-CM | POA: Diagnosis present

## 2020-12-18 DIAGNOSIS — Z888 Allergy status to other drugs, medicaments and biological substances status: Secondary | ICD-10-CM

## 2020-12-18 DIAGNOSIS — Z8249 Family history of ischemic heart disease and other diseases of the circulatory system: Secondary | ICD-10-CM | POA: Diagnosis not present

## 2020-12-18 DIAGNOSIS — I2510 Atherosclerotic heart disease of native coronary artery without angina pectoris: Secondary | ICD-10-CM | POA: Diagnosis present

## 2020-12-18 DIAGNOSIS — Z8701 Personal history of pneumonia (recurrent): Secondary | ICD-10-CM

## 2020-12-18 DIAGNOSIS — Z83438 Family history of other disorder of lipoprotein metabolism and other lipidemia: Secondary | ICD-10-CM

## 2020-12-18 DIAGNOSIS — D696 Thrombocytopenia, unspecified: Secondary | ICD-10-CM | POA: Diagnosis not present

## 2020-12-18 DIAGNOSIS — Z87891 Personal history of nicotine dependence: Secondary | ICD-10-CM | POA: Diagnosis not present

## 2020-12-18 DIAGNOSIS — J90 Pleural effusion, not elsewhere classified: Secondary | ICD-10-CM | POA: Diagnosis present

## 2020-12-18 DIAGNOSIS — T82855A Stenosis of coronary artery stent, initial encounter: Principal | ICD-10-CM | POA: Diagnosis present

## 2020-12-18 DIAGNOSIS — I2582 Chronic total occlusion of coronary artery: Secondary | ICD-10-CM | POA: Diagnosis present

## 2020-12-18 DIAGNOSIS — K219 Gastro-esophageal reflux disease without esophagitis: Secondary | ICD-10-CM | POA: Diagnosis present

## 2020-12-18 DIAGNOSIS — J939 Pneumothorax, unspecified: Secondary | ICD-10-CM

## 2020-12-18 DIAGNOSIS — Z951 Presence of aortocoronary bypass graft: Secondary | ICD-10-CM

## 2020-12-18 HISTORY — PX: TEE WITHOUT CARDIOVERSION: SHX5443

## 2020-12-18 LAB — POCT I-STAT 7, (LYTES, BLD GAS, ICA,H+H)
Acid-base deficit: 1 mmol/L (ref 0.0–2.0)
Acid-base deficit: 2 mmol/L (ref 0.0–2.0)
Acid-base deficit: 3 mmol/L — ABNORMAL HIGH (ref 0.0–2.0)
Acid-base deficit: 4 mmol/L — ABNORMAL HIGH (ref 0.0–2.0)
Acid-base deficit: 4 mmol/L — ABNORMAL HIGH (ref 0.0–2.0)
Acid-base deficit: 3 mmol/L — ABNORMAL HIGH (ref 0.0–2.0)
Acid-base deficit: 4 mmol/L — ABNORMAL HIGH (ref 0.0–2.0)
Bicarbonate: 24.8 mmol/L (ref 20.0–28.0)
Bicarbonate: 25.4 mmol/L (ref 20.0–28.0)
Bicarbonate: 23.3 mmol/L (ref 20.0–28.0)
Bicarbonate: 22.4 mmol/L (ref 20.0–28.0)
Bicarbonate: 21.3 mmol/L (ref 20.0–28.0)
Bicarbonate: 20.1 mmol/L (ref 20.0–28.0)
Bicarbonate: 21.7 mmol/L (ref 20.0–28.0)
Calcium, Ion: 1.29 mmol/L (ref 1.15–1.40)
Calcium, Ion: 1.27 mmol/L (ref 1.15–1.40)
Calcium, Ion: 1.28 mmol/L (ref 1.15–1.40)
Calcium, Ion: 1.23 mmol/L (ref 1.15–1.40)
Calcium, Ion: 1.18 mmol/L (ref 1.15–1.40)
Calcium, Ion: 1.22 mmol/L (ref 1.15–1.40)
Calcium, Ion: 1.25 mmol/L (ref 1.15–1.40)
HCT: 36 % — ABNORMAL LOW (ref 39.0–52.0)
HCT: 38 % — ABNORMAL LOW (ref 39.0–52.0)
HCT: 37 % — ABNORMAL LOW (ref 39.0–52.0)
HCT: 33 % — ABNORMAL LOW (ref 39.0–52.0)
HCT: 27 % — ABNORMAL LOW (ref 39.0–52.0)
HCT: 27 % — ABNORMAL LOW (ref 39.0–52.0)
HCT: 27 % — ABNORMAL LOW (ref 39.0–52.0)
Hemoglobin: 12.2 g/dL — ABNORMAL LOW (ref 13.0–17.0)
Hemoglobin: 12.9 g/dL — ABNORMAL LOW (ref 13.0–17.0)
Hemoglobin: 12.6 g/dL — ABNORMAL LOW (ref 13.0–17.0)
Hemoglobin: 11.2 g/dL — ABNORMAL LOW (ref 13.0–17.0)
Hemoglobin: 9.2 g/dL — ABNORMAL LOW (ref 13.0–17.0)
Hemoglobin: 9.2 g/dL — ABNORMAL LOW (ref 13.0–17.0)
Hemoglobin: 9.2 g/dL — ABNORMAL LOW (ref 13.0–17.0)
O2 Saturation: 100 %
O2 Saturation: 97 %
O2 Saturation: 96 %
O2 Saturation: 95 %
O2 Saturation: 99 %
O2 Saturation: 100 %
O2 Saturation: 99 %
Patient temperature: 34.8
Patient temperature: 35.3
Patient temperature: 36.6
Potassium: 3.9 mmol/L (ref 3.5–5.1)
Potassium: 4.3 mmol/L (ref 3.5–5.1)
Potassium: 3.9 mmol/L (ref 3.5–5.1)
Potassium: 3.7 mmol/L (ref 3.5–5.1)
Potassium: 3.4 mmol/L — ABNORMAL LOW (ref 3.5–5.1)
Potassium: 4 mmol/L (ref 3.5–5.1)
Potassium: 4.1 mmol/L (ref 3.5–5.1)
Sodium: 137 mmol/L (ref 135–145)
Sodium: 137 mmol/L (ref 135–145)
Sodium: 137 mmol/L (ref 135–145)
Sodium: 136 mmol/L (ref 135–145)
Sodium: 140 mmol/L (ref 135–145)
Sodium: 137 mmol/L (ref 135–145)
Sodium: 137 mmol/L (ref 135–145)
TCO2: 26 mmol/L (ref 22–32)
TCO2: 27 mmol/L (ref 22–32)
TCO2: 25 mmol/L (ref 22–32)
TCO2: 24 mmol/L (ref 22–32)
TCO2: 22 mmol/L (ref 22–32)
TCO2: 21 mmol/L — ABNORMAL LOW (ref 22–32)
TCO2: 23 mmol/L (ref 22–32)
pCO2 arterial: 45.9 mmHg (ref 32.0–48.0)
pCO2 arterial: 55.6 mmHg — ABNORMAL HIGH (ref 32.0–48.0)
pCO2 arterial: 45.5 mmHg (ref 32.0–48.0)
pCO2 arterial: 43 mmHg (ref 32.0–48.0)
pCO2 arterial: 35 mmHg (ref 32.0–48.0)
pCO2 arterial: 27.1 mmHg — ABNORMAL LOW (ref 32.0–48.0)
pCO2 arterial: 40.2 mmHg (ref 32.0–48.0)
pH, Arterial: 7.34 — ABNORMAL LOW (ref 7.350–7.450)
pH, Arterial: 7.268 — ABNORMAL LOW (ref 7.350–7.450)
pH, Arterial: 7.317 — ABNORMAL LOW (ref 7.350–7.450)
pH, Arterial: 7.324 — ABNORMAL LOW (ref 7.350–7.450)
pH, Arterial: 7.382 (ref 7.350–7.450)
pH, Arterial: 7.473 — ABNORMAL HIGH (ref 7.350–7.450)
pH, Arterial: 7.338 — ABNORMAL LOW (ref 7.350–7.450)
pO2, Arterial: 201 mmHg — ABNORMAL HIGH (ref 83.0–108.0)
pO2, Arterial: 101 mmHg (ref 83.0–108.0)
pO2, Arterial: 90 mmHg (ref 83.0–108.0)
pO2, Arterial: 83 mmHg (ref 83.0–108.0)
pO2, Arterial: 139 mmHg — ABNORMAL HIGH (ref 83.0–108.0)
pO2, Arterial: 158 mmHg — ABNORMAL HIGH (ref 83.0–108.0)
pO2, Arterial: 145 mmHg — ABNORMAL HIGH (ref 83.0–108.0)

## 2020-12-18 LAB — BASIC METABOLIC PANEL
Anion gap: 6 (ref 5–15)
BUN: 14 mg/dL (ref 8–23)
CO2: 23 mmol/L (ref 22–32)
Calcium: 8.7 mg/dL — ABNORMAL LOW (ref 8.9–10.3)
Chloride: 107 mmol/L (ref 98–111)
Creatinine, Ser: 0.9 mg/dL (ref 0.61–1.24)
GFR, Estimated: >60 mL/min (ref >60)
Glucose, Bld: 133 mg/dL — ABNORMAL HIGH (ref 70–99)
Potassium: 4.4 mmol/L (ref 3.5–5.1)
Sodium: 136 mmol/L (ref 135–145)

## 2020-12-18 LAB — POCT I-STAT, CHEM 8
BUN: 16 mg/dL (ref 8–23)
BUN: 16 mg/dL (ref 8–23)
BUN: 16 mg/dL (ref 8–23)
BUN: 16 mg/dL (ref 8–23)
BUN: 14 mg/dL (ref 8–23)
Calcium, Ion: 1.31 mmol/L (ref 1.15–1.40)
Calcium, Ion: 1.3 mmol/L (ref 1.15–1.40)
Calcium, Ion: 1.31 mmol/L (ref 1.15–1.40)
Calcium, Ion: 1.31 mmol/L (ref 1.15–1.40)
Calcium, Ion: 1.25 mmol/L (ref 1.15–1.40)
Chloride: 101 mmol/L (ref 98–111)
Chloride: 102 mmol/L (ref 98–111)
Chloride: 102 mmol/L (ref 98–111)
Chloride: 102 mmol/L (ref 98–111)
Chloride: 101 mmol/L (ref 98–111)
Creatinine, Ser: 0.8 mg/dL (ref 0.61–1.24)
Creatinine, Ser: 0.8 mg/dL (ref 0.61–1.24)
Creatinine, Ser: 0.8 mg/dL (ref 0.61–1.24)
Creatinine, Ser: 0.8 mg/dL (ref 0.61–1.24)
Creatinine, Ser: 0.8 mg/dL (ref 0.61–1.24)
Glucose, Bld: 124 mg/dL — ABNORMAL HIGH (ref 70–99)
Glucose, Bld: 127 mg/dL — ABNORMAL HIGH (ref 70–99)
Glucose, Bld: 140 mg/dL — ABNORMAL HIGH (ref 70–99)
Glucose, Bld: 142 mg/dL — ABNORMAL HIGH (ref 70–99)
Glucose, Bld: 132 mg/dL — ABNORMAL HIGH (ref 70–99)
HCT: 35 % — ABNORMAL LOW (ref 39.0–52.0)
HCT: 38 % — ABNORMAL LOW (ref 39.0–52.0)
HCT: 35 % — ABNORMAL LOW (ref 39.0–52.0)
HCT: 34 % — ABNORMAL LOW (ref 39.0–52.0)
HCT: 31 % — ABNORMAL LOW (ref 39.0–52.0)
Hemoglobin: 11.9 g/dL — ABNORMAL LOW (ref 13.0–17.0)
Hemoglobin: 12.9 g/dL — ABNORMAL LOW (ref 13.0–17.0)
Hemoglobin: 11.9 g/dL — ABNORMAL LOW (ref 13.0–17.0)
Hemoglobin: 11.6 g/dL — ABNORMAL LOW (ref 13.0–17.0)
Hemoglobin: 10.5 g/dL — ABNORMAL LOW (ref 13.0–17.0)
Potassium: 3.9 mmol/L (ref 3.5–5.1)
Potassium: 4.2 mmol/L (ref 3.5–5.1)
Potassium: 4.3 mmol/L (ref 3.5–5.1)
Potassium: 3.9 mmol/L (ref 3.5–5.1)
Potassium: 3.8 mmol/L (ref 3.5–5.1)
Sodium: 136 mmol/L (ref 135–145)
Sodium: 136 mmol/L (ref 135–145)
Sodium: 136 mmol/L (ref 135–145)
Sodium: 137 mmol/L (ref 135–145)
Sodium: 135 mmol/L (ref 135–145)
TCO2: 25 mmol/L (ref 22–32)
TCO2: 26 mmol/L (ref 22–32)
TCO2: 25 mmol/L (ref 22–32)
TCO2: 24 mmol/L (ref 22–32)
TCO2: 22 mmol/L (ref 22–32)

## 2020-12-18 LAB — GLUCOSE, CAPILLARY
Glucose-Capillary: 122 mg/dL — ABNORMAL HIGH (ref 70–99)
Glucose-Capillary: 123 mg/dL — ABNORMAL HIGH (ref 70–99)
Glucose-Capillary: 122 mg/dL — ABNORMAL HIGH (ref 70–99)
Glucose-Capillary: 132 mg/dL — ABNORMAL HIGH (ref 70–99)
Glucose-Capillary: 113 mg/dL — ABNORMAL HIGH (ref 70–99)
Glucose-Capillary: 168 mg/dL — ABNORMAL HIGH (ref 70–99)

## 2020-12-18 LAB — CBC
HCT: 28.3 % — ABNORMAL LOW (ref 39.0–52.0)
HCT: 28.9 % — ABNORMAL LOW (ref 39.0–52.0)
Hemoglobin: 10.1 g/dL — ABNORMAL LOW (ref 13.0–17.0)
Hemoglobin: 10.1 g/dL — ABNORMAL LOW (ref 13.0–17.0)
MCH: 34.9 pg — ABNORMAL HIGH (ref 26.0–34.0)
MCH: 34.2 pg — ABNORMAL HIGH (ref 26.0–34.0)
MCHC: 35.7 g/dL (ref 30.0–36.0)
MCHC: 34.9 g/dL (ref 30.0–36.0)
MCV: 97.9 fL (ref 80.0–100.0)
MCV: 98 fL (ref 80.0–100.0)
Platelets: 103 10*3/uL — ABNORMAL LOW (ref 150–400)
Platelets: 108 10*3/uL — ABNORMAL LOW (ref 150–400)
RBC: 2.89 MIL/uL — ABNORMAL LOW (ref 4.22–5.81)
RBC: 2.95 MIL/uL — ABNORMAL LOW (ref 4.22–5.81)
RDW: 11.8 % (ref 11.5–15.5)
RDW: 11.7 % (ref 11.5–15.5)
WBC: 7.9 10*3/uL (ref 4.0–10.5)
WBC: 9.2 10*3/uL (ref 4.0–10.5)
nRBC: 0 % (ref 0.0–0.2)
nRBC: 0 % (ref 0.0–0.2)

## 2020-12-18 LAB — PROTIME-INR
INR: 1.3 — ABNORMAL HIGH (ref 0.8–1.2)
Prothrombin Time: 15.7 seconds — ABNORMAL HIGH (ref 11.4–15.2)

## 2020-12-18 LAB — ABO/RH: ABO/RH(D): O POS

## 2020-12-18 LAB — MAGNESIUM: Magnesium: 2.6 mg/dL — ABNORMAL HIGH (ref 1.7–2.4)

## 2020-12-18 LAB — APTT: aPTT: 27 seconds (ref 24–36)

## 2020-12-18 SURGERY — CORONARY ARTERY BYPASS GRAFTING (CABG), ROBOT ASSISTED
Anesthesia: General | Site: Chest

## 2020-12-18 MED ORDER — TRAMADOL HCL 50 MG PO TABS
50.0000 mg | ORAL_TABLET | ORAL | Status: DC | PRN
  Administered 2020-12-18 – 2020-12-21 (×6): 100 mg via ORAL
  Filled 2020-12-18 (×6): qty 2

## 2020-12-18 MED ORDER — PROPOFOL 10 MG/ML IV BOLUS
INTRAVENOUS | Status: AC
  Filled 2020-12-18: qty 20

## 2020-12-18 MED ORDER — DEXMEDETOMIDINE HCL IN NACL 400 MCG/100ML IV SOLN
0.1000 ug/kg/h | INTRAVENOUS | Status: AC
  Administered 2020-12-18: .7 ug/kg/h via INTRAVENOUS
  Filled 2020-12-18: qty 100

## 2020-12-18 MED ORDER — NITROGLYCERIN IN D5W 200-5 MCG/ML-% IV SOLN: 0.0000 ug/min | INTRAVENOUS | Status: DC

## 2020-12-18 MED ORDER — CHLORHEXIDINE GLUCONATE 0.12% ORAL RINSE (MEDLINE KIT): 15.0000 mL | Freq: Two times a day (BID) | OROMUCOSAL | Status: DC

## 2020-12-18 MED ORDER — PROTAMINE SULFATE 10 MG/ML IV SOLN
INTRAVENOUS | Status: DC | PRN
  Administered 2020-12-18: 90 mg via INTRAVENOUS

## 2020-12-18 MED ORDER — ROCURONIUM BROMIDE 10 MG/ML (PF) SYRINGE
PREFILLED_SYRINGE | INTRAVENOUS | Status: AC
  Filled 2020-12-18: qty 20

## 2020-12-18 MED ORDER — PROPOFOL 10 MG/ML IV BOLUS
INTRAVENOUS | Status: DC | PRN
  Administered 2020-12-18: 100 mg via INTRAVENOUS
  Administered 2020-12-18: 50 mg via INTRAVENOUS

## 2020-12-18 MED ORDER — INSULIN REGULAR(HUMAN) IN NACL 100-0.9 UT/100ML-% IV SOLN
INTRAVENOUS | Status: AC
  Administered 2020-12-18: 1.7 [IU]/h via INTRAVENOUS
  Filled 2020-12-18: qty 100

## 2020-12-18 MED ORDER — BUPIVACAINE LIPOSOME 1.3 % IJ SUSP
INTRAMUSCULAR | Status: DC | PRN
  Administered 2020-12-18: 100 mL

## 2020-12-18 MED ORDER — ONDANSETRON HCL 4 MG/2ML IJ SOLN
INTRAMUSCULAR | Status: AC
  Filled 2020-12-18: qty 2

## 2020-12-18 MED ORDER — CHLORHEXIDINE GLUCONATE CLOTH 2 % EX PADS
6.0000 | MEDICATED_PAD | Freq: Every day | CUTANEOUS | Status: DC
  Administered 2020-12-18 – 2020-12-19 (×2): 6 via TOPICAL

## 2020-12-18 MED ORDER — ORAL CARE MOUTH RINSE
15.0000 mL | Freq: Two times a day (BID) | OROMUCOSAL | Status: DC
  Administered 2020-12-18 – 2020-12-20 (×3): 15 mL via OROMUCOSAL

## 2020-12-18 MED ORDER — ONDANSETRON HCL 4 MG/2ML IJ SOLN
INTRAMUSCULAR | Status: DC | PRN
  Administered 2020-12-18: 4 mg via INTRAVENOUS

## 2020-12-18 MED ORDER — SODIUM CHLORIDE 0.9 % IV SOLN
750.0000 mg | INTRAVENOUS | Status: AC
  Administered 2020-12-18: 750 mg via INTRAVENOUS
  Filled 2020-12-18: qty 750

## 2020-12-18 MED ORDER — BUPIVACAINE HCL (PF) 0.5 % IJ SOLN
INTRAMUSCULAR | Status: AC
  Filled 2020-12-18: qty 30

## 2020-12-18 MED ORDER — CHLORHEXIDINE GLUCONATE 0.12 % MT SOLN
OROMUCOSAL | Status: AC
  Administered 2020-12-18: 15 mL via OROMUCOSAL
  Filled 2020-12-18: qty 15

## 2020-12-18 MED ORDER — LACTATED RINGERS IV SOLN: INTRAVENOUS | Status: DC

## 2020-12-18 MED ORDER — MAGNESIUM SULFATE 4 GM/100ML IV SOLN
4.0000 g | Freq: Once | INTRAVENOUS | Status: AC
  Administered 2020-12-18: 4 g via INTRAVENOUS

## 2020-12-18 MED ORDER — VANCOMYCIN HCL IN DEXTROSE 1-5 GM/200ML-% IV SOLN
1000.0000 mg | Freq: Once | INTRAVENOUS | Status: AC
Start: 2020-12-18 — End: 2020-12-18
  Administered 2020-12-18: 1000 mg via INTRAVENOUS
  Filled 2020-12-18: qty 200

## 2020-12-18 MED ORDER — ACETAMINOPHEN 160 MG/5ML PO SOLN: 650.0000 mg | Freq: Once | ORAL | Status: AC

## 2020-12-18 MED ORDER — LACTATED RINGERS IV SOLN: INTRAVENOUS | Status: DC | PRN

## 2020-12-18 MED ORDER — CHLORHEXIDINE GLUCONATE 0.12 % MT SOLN
15.0000 mL | OROMUCOSAL | Status: AC
  Administered 2020-12-18: 15 mL via OROMUCOSAL

## 2020-12-18 MED ORDER — EPINEPHRINE HCL 5 MG/250ML IV SOLN IN NS
0.0000 ug/min | INTRAVENOUS | Status: DC
Start: 2020-12-18 — End: 2020-12-18
  Filled 2020-12-18: qty 250

## 2020-12-18 MED ORDER — FENTANYL CITRATE (PF) 250 MCG/5ML IJ SOLN
INTRAMUSCULAR | Status: DC | PRN
  Administered 2020-12-18: 400 ug via INTRAVENOUS
  Administered 2020-12-18 (×5): 50 ug via INTRAVENOUS

## 2020-12-18 MED ORDER — ALBUTEROL SULFATE (2.5 MG/3ML) 0.083% IN NEBU
INHALATION_SOLUTION | RESPIRATORY_TRACT | Status: AC
  Filled 2020-12-18: qty 3

## 2020-12-18 MED ORDER — FENTANYL CITRATE (PF) 250 MCG/5ML IJ SOLN
INTRAMUSCULAR | Status: AC
  Filled 2020-12-18: qty 10

## 2020-12-18 MED ORDER — MAGNESIUM SULFATE 50 % IJ SOLN
40.0000 meq | INTRAMUSCULAR | Status: DC
  Filled 2020-12-18: qty 9.85

## 2020-12-18 MED ORDER — MILRINONE LACTATE IN DEXTROSE 20-5 MG/100ML-% IV SOLN
0.3000 ug/kg/min | INTRAVENOUS | Status: DC
  Filled 2020-12-18: qty 100

## 2020-12-18 MED ORDER — PROTAMINE SULFATE 10 MG/ML IV SOLN
INTRAVENOUS | Status: AC
  Filled 2020-12-18: qty 10

## 2020-12-18 MED ORDER — MIDAZOLAM HCL 5 MG/5ML IJ SOLN
INTRAMUSCULAR | Status: DC | PRN
  Administered 2020-12-18: 2 mg via INTRAVENOUS

## 2020-12-18 MED ORDER — VANCOMYCIN HCL 1250 MG/250ML IV SOLN
1250.0000 mg | INTRAVENOUS | Status: AC
  Administered 2020-12-18: 1250 mg via INTRAVENOUS
  Filled 2020-12-18 (×2): qty 250

## 2020-12-18 MED ORDER — METOPROLOL TARTRATE 5 MG/5ML IV SOLN: 2.5000 mg | INTRAVENOUS | Status: DC | PRN

## 2020-12-18 MED ORDER — ACETAMINOPHEN 160 MG/5ML PO SOLN
1000.0000 mg | Freq: Four times a day (QID) | ORAL | Status: DC
  Administered 2020-12-19: 1000 mg
  Filled 2020-12-18: qty 40.6

## 2020-12-18 MED ORDER — INDOCYANINE GREEN 25 MG IV SOLR
INTRAVENOUS | Status: AC
  Filled 2020-12-18: qty 10

## 2020-12-18 MED ORDER — SODIUM CHLORIDE 0.9% FLUSH
10.0000 mL | Freq: Two times a day (BID) | INTRAVENOUS | Status: DC
  Administered 2020-12-18 – 2020-12-20 (×4): 10 mL

## 2020-12-18 MED ORDER — FAMOTIDINE IN NACL 20-0.9 MG/50ML-% IV SOLN
20.0000 mg | Freq: Two times a day (BID) | INTRAVENOUS | Status: AC
  Administered 2020-12-18 (×2): 20 mg via INTRAVENOUS
  Filled 2020-12-18: qty 50

## 2020-12-18 MED ORDER — ASPIRIN 81 MG PO CHEW
324.0000 mg | CHEWABLE_TABLET | Freq: Every day | ORAL | Status: DC
  Administered 2020-12-19: 324 mg
  Filled 2020-12-18: qty 4

## 2020-12-18 MED ORDER — DOCUSATE SODIUM 100 MG PO CAPS
200.0000 mg | ORAL_CAPSULE | Freq: Every day | ORAL | Status: DC
  Administered 2020-12-19 – 2020-12-21 (×3): 200 mg via ORAL
  Filled 2020-12-18 (×3): qty 2

## 2020-12-18 MED ORDER — PHENYLEPHRINE HCL-NACL 20-0.9 MG/250ML-% IV SOLN
30.0000 ug/min | INTRAVENOUS | Status: AC
Start: 2020-12-18 — End: 2020-12-18
  Administered 2020-12-18: 15 ug/min via INTRAVENOUS
  Filled 2020-12-18: qty 250

## 2020-12-18 MED ORDER — ORAL CARE MOUTH RINSE
15.0000 mL | OROMUCOSAL | Status: DC
  Administered 2020-12-18: 15 mL via OROMUCOSAL

## 2020-12-18 MED ORDER — ACETAMINOPHEN 500 MG PO TABS: 1000.0000 mg | ORAL_TABLET | Freq: Once | ORAL | Status: DC

## 2020-12-18 MED ORDER — DEXTROSE 50 % IV SOLN: 0.0000 mL | INTRAVENOUS | Status: DC | PRN

## 2020-12-18 MED ORDER — MIDAZOLAM HCL 2 MG/2ML IJ SOLN: 2.0000 mg | INTRAMUSCULAR | Status: DC | PRN

## 2020-12-18 MED ORDER — SODIUM CHLORIDE 0.9 % IV SOLN
1.5000 g | INTRAVENOUS | Status: AC
  Administered 2020-12-18: 1.5 g via INTRAVENOUS
  Filled 2020-12-18: qty 1.5

## 2020-12-18 MED ORDER — SODIUM CHLORIDE 0.45 % IV SOLN: INTRAVENOUS | Status: DC | PRN

## 2020-12-18 MED ORDER — BISACODYL 10 MG RE SUPP: 10.0000 mg | Freq: Every day | RECTAL | Status: DC

## 2020-12-18 MED ORDER — PANTOPRAZOLE SODIUM 40 MG PO TBEC
40.0000 mg | DELAYED_RELEASE_TABLET | Freq: Every day | ORAL | Status: DC
  Administered 2020-12-20 – 2020-12-21 (×2): 40 mg via ORAL
  Filled 2020-12-18 (×2): qty 1

## 2020-12-18 MED ORDER — PHENYLEPHRINE 40 MCG/ML (10ML) SYRINGE FOR IV PUSH (FOR BLOOD PRESSURE SUPPORT)
PREFILLED_SYRINGE | INTRAVENOUS | Status: DC | PRN
  Administered 2020-12-18 (×4): 80 ug via INTRAVENOUS

## 2020-12-18 MED ORDER — PLASMA-LYTE 148 IV SOLN
INTRAVENOUS | Status: AC
  Administered 2020-12-18: 500 mL
  Filled 2020-12-18: qty 2.5

## 2020-12-18 MED ORDER — MIDAZOLAM HCL 2 MG/2ML IJ SOLN
INTRAMUSCULAR | Status: AC
  Filled 2020-12-18: qty 2

## 2020-12-18 MED ORDER — POTASSIUM CHLORIDE 2 MEQ/ML IV SOLN
80.0000 meq | INTRAVENOUS | Status: DC
  Filled 2020-12-18: qty 40

## 2020-12-18 MED ORDER — MORPHINE SULFATE (PF) 2 MG/ML IV SOLN
1.0000 mg | INTRAVENOUS | Status: DC | PRN
  Administered 2020-12-18 (×2): 2 mg via INTRAVENOUS
  Administered 2020-12-18 – 2020-12-19 (×2): 4 mg via INTRAVENOUS
  Filled 2020-12-18: qty 1
  Filled 2020-12-18 (×2): qty 2
  Filled 2020-12-18: qty 1

## 2020-12-18 MED ORDER — METOPROLOL TARTRATE 12.5 MG HALF TABLET: 12.5000 mg | ORAL_TABLET | Freq: Once | ORAL | Status: DC

## 2020-12-18 MED ORDER — INSULIN REGULAR(HUMAN) IN NACL 100-0.9 UT/100ML-% IV SOLN: INTRAVENOUS | Status: DC

## 2020-12-18 MED ORDER — CLOPIDOGREL BISULFATE 75 MG PO TABS
75.0000 mg | ORAL_TABLET | Freq: Every day | ORAL | Status: DC
  Administered 2020-12-19 – 2020-12-21 (×3): 75 mg via ORAL
  Filled 2020-12-18 (×3): qty 1

## 2020-12-18 MED ORDER — METOPROLOL TARTRATE 12.5 MG HALF TABLET
ORAL_TABLET | ORAL | Status: AC
  Filled 2020-12-18: qty 1

## 2020-12-18 MED ORDER — DEXMEDETOMIDINE HCL IN NACL 400 MCG/100ML IV SOLN: 0.0000 ug/kg/h | INTRAVENOUS | Status: DC

## 2020-12-18 MED ORDER — SODIUM CHLORIDE 0.9 % IV SOLN
1.5000 g | Freq: Two times a day (BID) | INTRAVENOUS | Status: AC
  Administered 2020-12-18 – 2020-12-20 (×4): 1.5 g via INTRAVENOUS
  Filled 2020-12-18 (×5): qty 1.5

## 2020-12-18 MED ORDER — SODIUM CHLORIDE 0.9 % IV SOLN
INTRAVENOUS | Status: DC
  Filled 2020-12-18: qty 30

## 2020-12-18 MED ORDER — FENTANYL CITRATE (PF) 250 MCG/5ML IJ SOLN
INTRAMUSCULAR | Status: AC
  Filled 2020-12-18: qty 5

## 2020-12-18 MED ORDER — ACETAMINOPHEN 10 MG/ML IV SOLN
INTRAVENOUS | Status: DC | PRN
  Administered 2020-12-18: 1000 mg via INTRAVENOUS

## 2020-12-18 MED ORDER — DEXAMETHASONE SODIUM PHOSPHATE 10 MG/ML IJ SOLN
INTRAMUSCULAR | Status: AC
  Filled 2020-12-18: qty 1

## 2020-12-18 MED ORDER — SODIUM CHLORIDE 0.9% FLUSH: 3.0000 mL | INTRAVENOUS | Status: DC | PRN

## 2020-12-18 MED ORDER — MANNITOL 20 % IV SOLN
Freq: Once | INTRAVENOUS | Status: DC
  Filled 2020-12-18: qty 13

## 2020-12-18 MED ORDER — ACETAMINOPHEN 650 MG RE SUPP
650.0000 mg | Freq: Once | RECTAL | Status: AC
  Administered 2020-12-18: 650 mg via RECTAL

## 2020-12-18 MED ORDER — TRANEXAMIC ACID 1000 MG/10ML IV SOLN
1.5000 mg/kg/h | INTRAVENOUS | Status: AC
  Administered 2020-12-18: 1.5 mg/kg/h via INTRAVENOUS
  Filled 2020-12-18: qty 25

## 2020-12-18 MED ORDER — METOPROLOL TARTRATE 12.5 MG HALF TABLET
12.5000 mg | ORAL_TABLET | Freq: Two times a day (BID) | ORAL | Status: DC
  Administered 2020-12-19 – 2020-12-21 (×5): 12.5 mg via ORAL
  Filled 2020-12-18 (×5): qty 1

## 2020-12-18 MED ORDER — HEPARIN SODIUM (PORCINE) 1000 UNIT/ML IJ SOLN
INTRAMUSCULAR | Status: DC | PRN
  Administered 2020-12-18: 9000 [IU] via INTRAVENOUS

## 2020-12-18 MED ORDER — SODIUM CHLORIDE 0.9 % IV SOLN: INTRAVENOUS | Status: DC

## 2020-12-18 MED ORDER — SODIUM CHLORIDE 0.9 % IV SOLN: 250.0000 mL | INTRAVENOUS | Status: DC

## 2020-12-18 MED ORDER — POTASSIUM CHLORIDE 10 MEQ/50ML IV SOLN
10.0000 meq | INTRAVENOUS | Status: AC
Start: 2020-12-18 — End: 2020-12-18
  Administered 2020-12-18 (×3): 10 meq via INTRAVENOUS

## 2020-12-18 MED ORDER — METOPROLOL TARTRATE 25 MG/10 ML ORAL SUSPENSION
12.5000 mg | Freq: Two times a day (BID) | ORAL | Status: DC
  Filled 2020-12-18 (×4): qty 5

## 2020-12-18 MED ORDER — NOREPINEPHRINE 4 MG/250ML-% IV SOLN
0.0000 ug/min | INTRAVENOUS | Status: DC
  Filled 2020-12-18 (×2): qty 250

## 2020-12-18 MED ORDER — LACTATED RINGERS IV SOLN: 500.0000 mL | Freq: Once | INTRAVENOUS | Status: DC | PRN

## 2020-12-18 MED ORDER — ALBUMIN HUMAN 5 % IV SOLN: INTRAVENOUS | Status: DC | PRN

## 2020-12-18 MED ORDER — NITROGLYCERIN IN D5W 200-5 MCG/ML-% IV SOLN
2.0000 ug/min | INTRAVENOUS | Status: DC
  Filled 2020-12-18: qty 250

## 2020-12-18 MED ORDER — TRANEXAMIC ACID (OHS) BOLUS VIA INFUSION
15.0000 mg/kg | INTRAVENOUS | Status: AC
  Administered 2020-12-18: 1309.5 mg via INTRAVENOUS
  Filled 2020-12-18: qty 1310

## 2020-12-18 MED ORDER — ROCURONIUM BROMIDE 10 MG/ML (PF) SYRINGE
PREFILLED_SYRINGE | INTRAVENOUS | Status: DC | PRN
  Administered 2020-12-18: 20 mg via INTRAVENOUS
  Administered 2020-12-18 (×3): 30 mg via INTRAVENOUS
  Administered 2020-12-18: 80 mg via INTRAVENOUS
  Administered 2020-12-18: 30 mg via INTRAVENOUS

## 2020-12-18 MED ORDER — ACETAMINOPHEN 10 MG/ML IV SOLN
INTRAVENOUS | Status: AC
  Filled 2020-12-18: qty 100

## 2020-12-18 MED ORDER — ASPIRIN EC 325 MG PO TBEC: 325.0000 mg | DELAYED_RELEASE_TABLET | Freq: Every day | ORAL | Status: DC

## 2020-12-18 MED ORDER — CHLORHEXIDINE GLUCONATE 4 % EX LIQD: 30.0000 mL | CUTANEOUS | Status: DC

## 2020-12-18 MED ORDER — ALBUMIN HUMAN 5 % IV SOLN
250.0000 mL | INTRAVENOUS | Status: AC | PRN
  Administered 2020-12-18: 12.5 g via INTRAVENOUS
  Filled 2020-12-18: qty 250

## 2020-12-18 MED ORDER — SODIUM CHLORIDE 0.9 % IV SOLN: INTRAVENOUS | Status: DC | PRN

## 2020-12-18 MED ORDER — HEMOSTATIC AGENTS (NO CHARGE) OPTIME
TOPICAL | Status: DC | PRN
  Administered 2020-12-18: 1 via TOPICAL

## 2020-12-18 MED ORDER — DEXAMETHASONE SODIUM PHOSPHATE 10 MG/ML IJ SOLN
INTRAMUSCULAR | Status: DC | PRN
  Administered 2020-12-18: 8 mg via INTRAVENOUS

## 2020-12-18 MED ORDER — SODIUM CHLORIDE 0.9% FLUSH: 10.0000 mL | INTRAVENOUS | Status: DC | PRN

## 2020-12-18 MED ORDER — TRANEXAMIC ACID (OHS) PUMP PRIME SOLUTION
2.0000 mg/kg | INTRAVENOUS | Status: DC
  Filled 2020-12-18: qty 1.75

## 2020-12-18 MED ORDER — ONDANSETRON HCL 4 MG/2ML IJ SOLN: 4.0000 mg | Freq: Four times a day (QID) | INTRAMUSCULAR | Status: DC | PRN

## 2020-12-18 MED ORDER — BISACODYL 5 MG PO TBEC
10.0000 mg | DELAYED_RELEASE_TABLET | Freq: Every day | ORAL | Status: DC
  Administered 2020-12-19 – 2020-12-21 (×3): 10 mg via ORAL
  Filled 2020-12-18 (×3): qty 2

## 2020-12-18 MED ORDER — 0.9 % SODIUM CHLORIDE (POUR BTL) OPTIME
TOPICAL | Status: DC | PRN
  Administered 2020-12-18: 1000 mL

## 2020-12-18 MED ORDER — INDOCYANINE GREEN 25 MG IV SOLR
INTRAVENOUS | Status: DC | PRN
  Administered 2020-12-18: 15 mg via INTRAVENOUS

## 2020-12-18 MED ORDER — PHENYLEPHRINE HCL-NACL 20-0.9 MG/250ML-% IV SOLN
0.0000 ug/min | INTRAVENOUS | Status: DC
  Administered 2020-12-18: 20 ug/min via INTRAVENOUS
  Filled 2020-12-18 (×2): qty 250

## 2020-12-18 MED ORDER — BUPIVACAINE LIPOSOME 1.3 % IJ SUSP
INTRAMUSCULAR | Status: AC
  Filled 2020-12-18: qty 20

## 2020-12-18 MED ORDER — OXYCODONE HCL 5 MG PO TABS
5.0000 mg | ORAL_TABLET | ORAL | Status: DC | PRN
  Administered 2020-12-19 – 2020-12-20 (×7): 10 mg via ORAL
  Filled 2020-12-18 (×7): qty 2

## 2020-12-18 MED ORDER — SODIUM CHLORIDE 0.9% FLUSH
3.0000 mL | Freq: Two times a day (BID) | INTRAVENOUS | Status: DC
  Administered 2020-12-19: 3 mL via INTRAVENOUS

## 2020-12-18 MED ORDER — NOREPINEPHRINE 4 MG/250ML-% IV SOLN: 0.0000 ug/min | INTRAVENOUS | Status: DC

## 2020-12-18 MED ORDER — ACETAMINOPHEN 500 MG PO TABS
1000.0000 mg | ORAL_TABLET | Freq: Four times a day (QID) | ORAL | Status: DC
  Administered 2020-12-19 – 2020-12-21 (×8): 1000 mg via ORAL
  Filled 2020-12-18 (×8): qty 2

## 2020-12-18 MED ORDER — HEPARIN SODIUM (PORCINE) 1000 UNIT/ML IJ SOLN
INTRAMUSCULAR | Status: AC
  Filled 2020-12-18: qty 1

## 2020-12-18 MED ORDER — CHLORHEXIDINE GLUCONATE 0.12 % MT SOLN: 15.0000 mL | Freq: Once | OROMUCOSAL | Status: AC

## 2020-12-18 SURGICAL SUPPLY — 101 items
ADH SKN CLS APL DERMABOND .7 (GAUZE/BANDAGES/DRESSINGS) ×2
APPLIER CLIP ROT 10 11.4 M/L (STAPLE) ×3
APR CLP MED LRG 11.4X10 (STAPLE) ×2
BLADE STERNUM SYSTEM 6 (BLADE) IMPLANT
BLOWER MISTER CAL-MED (MISCELLANEOUS) ×3 IMPLANT
CLIP APPLIE ROT 10 11.4 M/L (STAPLE) IMPLANT
CLIP TI WIDE RED SMALL 6 (CLIP) ×1 IMPLANT
CLIP VESOCCLUDE MED 6/CT (CLIP) ×2 IMPLANT
CLIP VESOLOCK MED LG 6/CT (CLIP) ×1 IMPLANT
CNTNR URN SCR LID CUP LEK RST (MISCELLANEOUS) IMPLANT
CONNECTOR BLAKE 2:1 CARIO BLK (MISCELLANEOUS) IMPLANT
CONT SPEC 4OZ STRL OR WHT (MISCELLANEOUS) ×3
COVER TIP SHEARS 8 DVNC (MISCELLANEOUS) IMPLANT
COVER TIP SHEARS 8MM DA VINCI (MISCELLANEOUS) ×3
DEFOGGER SCOPE WARMER CLEARIFY (MISCELLANEOUS) ×3 IMPLANT
DERMABOND ADVANCED (GAUZE/BANDAGES/DRESSINGS) ×1
DERMABOND ADVANCED .7 DNX12 (GAUZE/BANDAGES/DRESSINGS) ×2 IMPLANT
DRAIN CHANNEL 19F RND (DRAIN) ×3 IMPLANT
DRAIN CONNECTOR BLAKE 1:1 (MISCELLANEOUS) ×3 IMPLANT
DRAPE ARM DVNC X/XI (DISPOSABLE) ×8 IMPLANT
DRAPE COLUMN DVNC XI (DISPOSABLE) ×2 IMPLANT
DRAPE CV SPLIT W-CLR ANES SCRN (DRAPES) ×3 IMPLANT
DRAPE DA VINCI XI ARM (DISPOSABLE) ×12
DRAPE DA VINCI XI COLUMN (DISPOSABLE) ×3
DRAPE PERI GROIN 82X75IN TIB (DRAPES) ×3 IMPLANT
DRAPE WARM FLUID 44X44 (DRAPES) ×3 IMPLANT
DRESSING AQUACEL AG SP 3.5X4 (GAUZE/BANDAGES/DRESSINGS) IMPLANT
DRESSING AQUACEL AG SP 3.5X6 (GAUZE/BANDAGES/DRESSINGS) IMPLANT
DRSG AQUACEL AG SP 3.5X4 (GAUZE/BANDAGES/DRESSINGS)
DRSG AQUACEL AG SP 3.5X6 (GAUZE/BANDAGES/DRESSINGS) ×3
ELECT BLADE 4.0 EZ CLEAN MEGAD (MISCELLANEOUS) ×3
ELECT REM PT RETURN 9FT ADLT (ELECTROSURGICAL) ×6
ELECTRODE BLDE 4.0 EZ CLN MEGD (MISCELLANEOUS) IMPLANT
ELECTRODE REM PT RTRN 9FT ADLT (ELECTROSURGICAL) ×4 IMPLANT
FELT TEFLON 1X6 (MISCELLANEOUS) IMPLANT
GAUZE KITTNER 4X5 RF (MISCELLANEOUS) ×1 IMPLANT
GAUZE SPONGE 4X4 12PLY STRL (GAUZE/BANDAGES/DRESSINGS) ×1 IMPLANT
GLOVE BIO SURGEON STRL SZ7 (GLOVE) ×6 IMPLANT
GOWN STRL REUS W/ TWL LRG LVL3 (GOWN DISPOSABLE) ×2 IMPLANT
GOWN STRL REUS W/ TWL XL LVL3 (GOWN DISPOSABLE) ×2 IMPLANT
GOWN STRL REUS W/TWL LRG LVL3 (GOWN DISPOSABLE) ×12
GOWN STRL REUS W/TWL XL LVL3 (GOWN DISPOSABLE) ×9
HEMOSTAT POWDER SURGIFOAM 1G (HEMOSTASIS) IMPLANT
IRRIGATOR SUCT 8 DISP DVNC XI (IRRIGATION / IRRIGATOR) IMPLANT
IRRIGATOR SUCTION 8MM XI DISP (IRRIGATION / IRRIGATOR) ×3
KIT SUCTION CATH 14FR (SUCTIONS) IMPLANT
NDL HYPO 25GX1X1/2 BEV (NEEDLE) ×2 IMPLANT
NDL SPNL 22GX3.5 QUINCKE BK (NEEDLE) ×2 IMPLANT
NEEDLE HYPO 25GX1X1/2 BEV (NEEDLE) ×3 IMPLANT
NEEDLE SPNL 22GX3.5 QUINCKE BK (NEEDLE) ×3 IMPLANT
OFFPUMP STABILIZER SUV (MISCELLANEOUS) IMPLANT
PACK OPEN HEART (CUSTOM PROCEDURE TRAY) ×3 IMPLANT
PAD ARMBOARD 7.5X6 YLW CONV (MISCELLANEOUS) ×6 IMPLANT
PAD ELECT DEFIB RADIOL ZOLL (MISCELLANEOUS) ×3 IMPLANT
POSITIONER ACROBAT-I OFFPUMP (MISCELLANEOUS) IMPLANT
RTRCTR WOUND ALEXIS 18CM SML (INSTRUMENTS) ×3
SAVER CELL AAL HAEMONETICS (INSTRUMENTS) ×2 IMPLANT
SEAL CANN UNIV 5-8 DVNC XI (MISCELLANEOUS) ×6 IMPLANT
SEAL XI 5MM-8MM UNIVERSAL (MISCELLANEOUS) ×9
SET TRI-LUMEN FLTR TB AIRSEAL (TUBING) ×3 IMPLANT
SHUNT CORONARY AXIUS 1.0 (MISCELLANEOUS) ×1 IMPLANT
SHUNT CORONARY AXIUS 1.25 (MISCELLANEOUS) ×1 IMPLANT
SHUNT FLO COIL 1.50MM (MISCELLANEOUS) ×1 IMPLANT
SHUNT FLO COIL 1.75 (MISCELLANEOUS) IMPLANT
SHUNT FLO COIL 2.00MM (MISCELLANEOUS) IMPLANT
SHUNT FLO COIL 2.25MM (MISCELLANEOUS) IMPLANT
SHUNT FLO COIL 2.50MM (MISCELLANEOUS) IMPLANT
STABILIZER OCTOPUS NUVO 26L (INSTRUMENTS) ×1 IMPLANT
STOPCOCK 4 WAY LG BORE MALE ST (IV SETS) ×3 IMPLANT
SUT BONE WAX W31G (SUTURE) IMPLANT
SUT ETHIBOND 2 0 SH (SUTURE)
SUT ETHIBOND 2 0 SH 36X2 (SUTURE) IMPLANT
SUT ETHIBOND X763 2 0 SH 1 (SUTURE) IMPLANT
SUT MNCRL AB 3-0 PS2 18 (SUTURE) ×3 IMPLANT
SUT PROLENE 4 0 RB 1 (SUTURE)
SUT PROLENE 4 0 SH DA (SUTURE) ×2 IMPLANT
SUT PROLENE 4-0 RB1 .5 CRCL 36 (SUTURE) IMPLANT
SUT PROLENE 5 0 C 1 36 (SUTURE) ×2 IMPLANT
SUT PROLENE 6 0 C 1 30 (SUTURE) IMPLANT
SUT PROLENE 7 0 BV 1 (SUTURE) ×6 IMPLANT
SUT PROLENE 8 0 BV175 6 (SUTURE) IMPLANT
SUT SILK  1 MH (SUTURE) ×3
SUT SILK 1 MH (SUTURE) ×2 IMPLANT
SUT SILK 1 TIES 10X30 (SUTURE) ×3 IMPLANT
SUT SILK 2 0 SH CR/8 (SUTURE) ×1 IMPLANT
SUT STEEL 6MS V (SUTURE) IMPLANT
SUT STEEL SZ 6 DBL 3X14 BALL (SUTURE) ×3 IMPLANT
SUT VIC AB 2-0 CTX 36 (SUTURE) IMPLANT
SUT VIC AB 3-0 SH 27 (SUTURE) ×3
SUT VIC AB 3-0 SH 27X BRD (SUTURE) ×2 IMPLANT
SUT VICRYL 0 UR6 27IN ABS (SUTURE) IMPLANT
SUT VICRYL 2 TP 1 (SUTURE) ×3 IMPLANT
SYR 10ML LL (SYRINGE) ×1 IMPLANT
SYR 50ML LL SCALE MARK (SYRINGE) ×3 IMPLANT
SYSTEM SAHARA CHEST DRAIN ATS (WOUND CARE) ×3 IMPLANT
TAPE PAPER 3X10 WHT MICROPORE (GAUZE/BANDAGES/DRESSINGS) ×1 IMPLANT
TAPES RETRACTO (MISCELLANEOUS) ×3 IMPLANT
TOWEL GREEN STERILE (TOWEL DISPOSABLE) ×3 IMPLANT
TRAY FOLEY SLVR 16FR TEMP STAT (SET/KITS/TRAYS/PACK) ×3 IMPLANT
TROCAR PORT AIRSEAL 8X100 (TROCAR) ×3 IMPLANT
TUBING EXTENTION W/L.L. (IV SETS) ×3 IMPLANT

## 2020-12-18 NOTE — Hospital Course (Signed)
Hospital Course:

## 2020-12-18 NOTE — Anesthesia Procedure Notes (Signed)
Arterial Line Insertion Start/End4/15/2022 7:00 AM, 12/18/2020 7:15 AM Performed by: Imagene Riches, CRNA, CRNA  Preanesthetic checklist: patient identified, IV checked, site marked, risks and benefits discussed, surgical consent, monitors and equipment checked, pre-op evaluation, timeout performed and anesthesia consent Right, radial was placed Catheter size: 20 G Hand hygiene performed , maximum sterile barriers used  and Seldinger technique used Allen's test indicative of satisfactory collateral circulation Attempts: 2 Procedure performed using ultrasound guided technique. Ultrasound Notes:anatomy identified, needle tip was noted to be adjacent to the nerve/plexus identified and no ultrasound evidence of intravascular and/or intraneural injection Following insertion, dressing applied and Biopatch. Post procedure assessment: normal  Patient tolerated the procedure well with no immediate complications.

## 2020-12-18 NOTE — Anesthesia Preprocedure Evaluation (Addendum)
Anesthesia Evaluation  Patient identified by MRN, date of birth, ID band Patient awake    Reviewed: Allergy & Precautions, H&P , NPO status , Patient's Chart, lab work & pertinent test results, reviewed documented beta blocker date and time   Airway Mallampati: II  TM Distance: >3 FB Neck ROM: Full    Dental no notable dental hx. (+) Teeth Intact, Dental Advisory Given   Pulmonary neg pulmonary ROS, former smoker,    Pulmonary exam normal breath sounds clear to auscultation       Cardiovascular hypertension, Pt. on medications and Pt. on home beta blockers + angina + CAD   Rhythm:Regular Rate:Normal     Neuro/Psych negative neurological ROS  negative psych ROS   GI/Hepatic Neg liver ROS, hiatal hernia, GERD  Medicated,  Endo/Other  negative endocrine ROS  Renal/GU negative Renal ROS  negative genitourinary   Musculoskeletal   Abdominal   Peds  Hematology negative hematology ROS (+)   Anesthesia Other Findings   Reproductive/Obstetrics negative OB ROS                            Anesthesia Physical Anesthesia Plan  ASA: IV  Anesthesia Plan: General   Post-op Pain Management:    Induction: Intravenous  PONV Risk Score and Plan: 3 and Ondansetron, Dexamethasone and Midazolam  Airway Management Planned: Double Lumen EBT  Additional Equipment: Arterial line, CVP and Ultrasound Guidance Line Placement  Intra-op Plan:   Post-operative Plan: Post-operative intubation/ventilation  Informed Consent: I have reviewed the patients History and Physical, chart, labs and discussed the procedure including the risks, benefits and alternatives for the proposed anesthesia with the patient or authorized representative who has indicated his/her understanding and acceptance.     Dental advisory given  Plan Discussed with: CRNA  Anesthesia Plan Comments:        Anesthesia Quick  Evaluation

## 2020-12-18 NOTE — Anesthesia Procedure Notes (Signed)
Date/Time: 12/18/2020 1:27 PM Performed by: Imagene Riches, CRNA Laryngoscope Size: Glidescope and 4 Grade View: Grade I Tube type: Oral Tube size: 8.5 mm Number of attempts: 1 Airway Equipment and Method: Stylet and Oral airway Placement Confirmation: ETT inserted through vocal cords under direct vision,  positive ETCO2 and breath sounds checked- equal and bilateral Secured at: 23 cm Tube secured with: Tape Dental Injury: Teeth and Oropharynx as per pre-operative assessment  Comments: Elective glide scope for exchanging double lumen ETT to single lumen for ICU.

## 2020-12-18 NOTE — Progress Notes (Signed)
TCTS Evening Rounds  DOS s/p OPCAB x 1 Waking successfully Minimal CT output BP 138/72   Pulse (!) 51   Temp 97.7 F (36.5 C) (Oral)   Resp 12   Ht 5\' 10"  (1.778 m)   Wt 87.3 kg   SpO2 100%   BMI 27.62 kg/m  Alert/responsive RRR CTA   Intake/Output Summary (Last 24 hours) at 12/18/2020 1611 Last data filed at 12/18/2020 1306 Gross per 24 hour  Intake 2825 ml  Output 1035 ml  Net 1790 ml   A/P: Extubate Routine post op care Joeanthony Seeling Z. Orvan Seen, [place]

## 2020-12-18 NOTE — Transfer of Care (Signed)
Immediate Anesthesia Transfer of Care Note  Patient: [name]  Procedure(s) Performed: XI ROBOTIC ASSISTED CORONARY ARTERY BYPASS GRAFTING (CABG); OFF PUMP TIMES 1 USING LEFT INTERNAL MAMMARY ARTERY. (Left Chest) TRANSESOPHAGEAL ECHOCARDIOGRAM (TEE) (N/A )  Patient Location: ICU  Anesthesia Type:General  Level of Consciousness: drowsy  Airway & Oxygen Therapy: Patient remains intubated per anesthesia plan and Patient placed on Ventilator (see vital sign flow sheet for setting)  Post-op Assessment: Report given to RN and Post -op Vital signs reviewed and stable  Post vital signs: Reviewed and stable  Last Vitals:  Vitals Value Taken Time  BP    Temp    Pulse    Resp 12 12/18/20 1400  SpO2 100 % 12/18/20 1400    Last Pain:  Vitals:   12/18/20 0624  TempSrc:   PainSc: 0-No pain      Patients Stated Pain Goal: 3 (34/91/79 1505)  Complications: No complications documented.

## 2020-12-18 NOTE — Anesthesia Procedure Notes (Signed)
Procedure Name: Intubation Date/Time: 12/18/2020 8:08 AM Performed by: Imagene Riches, CRNA Pre-anesthesia Checklist: Patient identified, Emergency Drugs available, Suction available and Patient being monitored Patient Re-evaluated:Patient Re-evaluated prior to induction Oxygen Delivery Method: Circle System Utilized Preoxygenation: Pre-oxygenation with 100% oxygen Induction Type: IV induction Ventilation: Mask ventilation without difficulty and Oral airway inserted - appropriate to patient size Laryngoscope Size: Sabra Heck and 2 Grade View: Grade I Tube type: Oral Endobronchial tube: Left and Double lumen EBT and 41 Fr Number of attempts: 1 Airway Equipment and Method: Stylet and Oral airway Placement Confirmation: ETT inserted through vocal cords under direct vision,  positive ETCO2 and breath sounds checked- equal and bilateral Secured at: 30 cm Tube secured with: Tape Dental Injury: Teeth and Oropharynx as per pre-operative assessment  Comments: First DL with Mac 3, grade 2b view of vocal cords, unable to pass double lumen ETT. Patient mask ventilated. Second DL with Sabra Heck 2, grade 1 view of vocal cords, successful passing of double lumen ETT. Lips and teeth remain as per preoperative assessment.

## 2020-12-18 NOTE — Op Note (Signed)
[address]       [place] [address]             [phone]        12/18/2020  Patient:  [name] Pre-Op Dx: Single-vessel coronary artery disease   History of PCI   Hypertension   Hyperlipidemia    Post-op Dx: Same Procedure: - Robotic assisted left video thoracoscopy -Robotic assisted left internal mammary harvest -Left anterior minithoracotomy -Off-pump coronary artery bypass grafting x1 with LIMA to LAD. - Intercostal nerve block  Surgeon and Role:      * Affan Callow, Lucile Crater, MD - Primary    *Dr. Roxan Hockey, MD- assisting  Anesthesia  general EBL: 500 ml Blood Administration: None Specimen: None  Drains: 19 F Blake chest tube x2 in left pleural space and pericardium chest Counts: correct   Indications: 70 year old male with totally occluded LAD.  He does have a history of multiple PCI's to the LAD.  He will require another echocardiogram for further evaluation of his valves and ventricular function.  We discussed multiple options for surgical revascularization, and given that he only has single-vessel disease he has elected to proceed with a robotic assisted LIMA harvest with minimally invasive LIMA to LAD bypass.  The risks and benefits have been discussed and is agreeable to proceed.   Findings: Good LIMA.  The LAD was slightly intramyocardial.  A 1.25 mm shunt was used during the anastomosis.  ICG injection was performed with good filling of of the LAD and evident flow distally.  Operative Technique: After the risks, benefits and alternatives were thoroughly discussed, the patient was brought to the operative theatre.  Anesthesia was induced, and the patient was then placed in a lazy right lateral decubitus position and was prepped and draped in normal sterile fashion.  An appropriate surgical pause was performed, and pre-operative antibiotics were dosed accordingly.  We began by placing our 3 robotic ports in the the mid axillary  line to triangulate the left internal thoracic artery.  Using a combination of Bovie cautery and blunt dissection of the internal thoracic artery was harvested as a pedicle.  Prior to dividing it distally the patient was systemically heparinized.  We then opened up the pericardium anteriorly to identify the left anterior descending artery.  We then used a finder needle to identify the correct interspace to perform our thoracotomy.  All the robotic arms were then removed and the robot was undocked.  A 5 cm incision was made just below the nipple and this was carried down to the level of the pleural space and pericardium with Bovie cautery.  Retractor was in place with thin the incision and a rib spreader was then used to expose the pericardium.  We opened up the pericardium some more and passed the left internal thoracic artery from the  pleural space into our surgical field.  The tissue stabilizer was then used to isolate the LAD.  An arteriotomy was created and shot was then passed into the artery.  We then performed an end-to-side anastomosis using 7-0 Prolene.  We injected a dose of ICG centrally to check our anastomosis.  And there was good evidence of flow through the LAD into the epicardium.  Stabilizer was removed along with a tissue retractor.  We placed the camera back into the pleural space to check for bleeding and also to make sure our conduit was not twisted.  We then placed our chest tubes and visualized  the lung reexpand.  An intercostal nerve block was then performed.  We then began to close a mini thoracotomy with intercostal Vicryl sutures.  The skin and soft tissue were then closed with several layers of absorbable suture.  All port sites were also closed.  The patient tolerated the procedure well find any immediate complications and was transferred to the ICU in guarded condition.   [name] [name]

## 2020-12-18 NOTE — Interval H&P Note (Signed)
History and Physical Interval Note:  12/18/2020 7:23 AM  [name]  has presented today for surgery, with the diagnosis of CAD.  The various methods of treatment have been discussed with the patient and family. After consideration of risks, benefits and other options for treatment, the patient has consented to  Procedure(s): XI ROBOTIC ASSISTED CORONARY ARTERY BYPASS GRAFTING (CABG) (N/A) TRANSESOPHAGEAL ECHOCARDIOGRAM (TEE) (N/A) as a surgical intervention.  The patient's history has been reviewed, patient examined, no change in status, stable for surgery.  I have reviewed the patient's chart and labs.  Questions were answered to the patient's satisfaction.     Murlin Schrieber Bary Leriche

## 2020-12-18 NOTE — Anesthesia Postprocedure Evaluation (Signed)
Anesthesia Post Note  Patient: [name]  Procedure(s) Performed: XI ROBOTIC ASSISTED CORONARY ARTERY BYPASS GRAFTING (CABG); OFF PUMP TIMES 1 USING LEFT INTERNAL MAMMARY ARTERY. (Left Chest) TRANSESOPHAGEAL ECHOCARDIOGRAM (TEE) (N/A )     Patient location during evaluation: SICU Anesthesia Type: General Level of consciousness: sedated Pain management: pain level controlled Vital Signs Assessment: post-procedure vital signs reviewed and stable Respiratory status: patient remains intubated per anesthesia plan Cardiovascular status: stable Postop Assessment: no apparent nausea or vomiting Anesthetic complications: no   No complications documented.  Last Vitals:  Vitals:   12/18/20 1400 12/18/20 1500  BP:  138/72  Pulse:  (!) 51  Resp: 12 12  Temp:    SpO2: 100% 100%    Last Pain:  Vitals:   12/18/20 0624  TempSrc:   PainSc: 0-No pain                 [name],W. EDMOND

## 2020-12-18 NOTE — Discharge Summary (Signed)
Physician Discharge Summary       [address]       [place] [address]             [phone]    Patient ID: [name] MRN: [id number] DOB/AGE: 05-07-1951 70 y.o.  Admit date: 12/18/2020 Discharge date: 12/21/2020  Admission Diagnoses: Single vessel coronary artery disease  Discharge Diagnoses:  1. S/p robotic assisted left video thoracoscopy, Robotic assisted left internal mammary harvest, Left anterior mini thoracotomy, Off-pump coronary artery bypass grafting x1 with LIMA to LAD. - Intercostal nerve block  2. History of hyperlipidemia 3. History of low back pain 4. History of Barrett's esophagus 5. History of GERD (gastroesophageal reflux disease) 6. History of remote tobacco abuse 7. History of pneumonia  Consults: None  Procedure (s):  Robotic assisted left video thoracoscopy -Robotic assisted left internal mammary harvest -Left anterior minithoracotomy -Off-pump coronary artery bypass grafting x1 with LIMA to LAD. - Intercostal nerve block by Dr. Kipp Brood on 12/18/2020.  History of Presenting Illness: This is a 70 year old gentleman referred by Dr. Irish Lack for surgical evaluation of LAD in-stent restenosis.  He has a long history of coronary artery disease and has undergone PCI to the LAD on 3 separate occasions.  Over the last 2 years she has had exertional chest pain and dyspnea that is relieved with his nitroglycerin.  He has been somewhat confused about his symptoms given his history of gastroesophageal reflux, but he does endorse relief of his symptoms with sublingual nitroglycerin.  He remains active and is still able to play golf and work in the yard without many symptoms.  He is unable to lay flat due to his reflux.  He denies any lower extremity swelling. Dr. Kipp Brood discussed the need for single vessel coronary artery bypass grafting surgery. Dr. Kipp Brood discussed using the [facility] robot to take down the left internal mammary artery.  Potential risks, benefits, and complications of the surgery were discussed with the patient and he agreed to proceed with surgery. Patient underwent a robotic assisted takedown of the LIMA and CABG x 1 by Dr. Kipp Brood on 12/18/2020.  Brief Hospital Course:  Per Nicholes Rough PA-C: [name] was taken to the OR on 12/18/2020 for a robotic assisted coronary artery bypass grafting, LIMA to LAD.  He tolerated the procedure well and was transferred to the surgical ICU for continued care.  He was extubated in a timely manner.  Postop day 1 he was on no drips.  He was started on Plavix and aspirin for his off-pump surgery.  We continued his chest tubes at this time due to output.  We are advancing his diet as expected and he was stable to transfer to the telemetry floor for continued care.  Postop day 1 his only complaint was some incisional soreness.  He did have a drop in hemoglobin down to 7.4 and we transfused 1 unit of blood.  We initiated a diuretic regimen for fluid overload.  His chest tube output was decreasing however when he stood up to transfer to the chair he did put out about 150 cc.  We planned to remove his chest tubes later in the afternoon.    Addendum: Patient remains in sinus rhythm. Accu checks and SS PRN were stopped as pre op HGA1C was 4.7 and he has no history of diabetes. He is ambulating on room air with good oxygenation. He has been tolerating a diet and is passing flatus. He was given a laxative to assist  with constipation. He had expected post op blood loss anemia. His H and H this am was up to 8.9 and 25.2. He had thrombocytopenia. His platelet count 04/18 was up to 96,000. Follow up chest x ray on 04/18 showed no pneumothorax,small left pleural effusion. His dressings were removed and all wounds are clean, dry, and healing without signs of infection. Regarding statin therapy, he is intolerant to Vytorin but is somewhat able to tolerate Crestor 10 mg at hs. Will resume Crestor and he  will follow up with cardiology who will address whether or not to consider another hyperlipidemic agent. He is felt surgically stable for discharge today. As discussed with surgeon, ok to discharge.   Latest Vital Signs: Blood pressure (!) 146/76, pulse 89, temperature 98.4 F (36.9 C), temperature source Oral, resp. rate 14, height 5\' 10"  (1.778 m), weight 87.8 kg, SpO2 99 %.  Physical Exam: Cardiovascular: RRR Pulmonary: Clear to auscultation bilaterally Abdomen: Soft, non tender, bowel sounds present. Extremities: Trace bilateral lower extremity edema. Wounds: Left anterior dressing removed and wound is clean and dry.  No erythema or signs of infection. No drainage from chest tube wounds.  Discharge Condition: Stable and discharged to home.  Recent laboratory studies:  Lab Results  Component Value Date   WBC 4.8 12/21/2020   HGB 8.9 (L) 12/21/2020   HCT 25.2 (L) 12/21/2020   MCV 95.1 12/21/2020   PLT 96 (L) 12/21/2020   Lab Results  Component Value Date   NA 125 (L) 12/20/2020   K 4.0 12/20/2020   CL 94 (L) 12/20/2020   CO2 23 12/20/2020   CREATININE 0.98 12/20/2020   GLUCOSE 120 (H) 12/20/2020      Diagnostic Studies: DG Chest 2 View  Result Date: 12/21/2020 CLINICAL DATA:  Chest tube removal. EXAM: CHEST - 2 VIEW COMPARISON:  December 20, 2020. FINDINGS: Interval removal of left-sided chest tubes. No visible pneumothorax. Probable small left pleural effusion. Similar opacities in the left mid lower lung. Similar cardiomediastinal silhouette, partially obscured on the left. Coronary artery stent. IMPRESSION: 1. Interval removal of the left-sided chest tubes without visible pneumothorax. 2. Similar opacities in the left mid and lower lung. 3. Probable small left pleural effusion. Electronically Signed   By: Margaretha Sheffield MD   On: 12/21/2020 09:07   DG Chest 2 View  Result Date: 12/17/2020 CLINICAL DATA:  Preop for CABG surgery. EXAM: CHEST - 2 VIEW COMPARISON:   02/14/2012 FINDINGS: Cardiac silhouette is normal in size and configuration. Left coronary artery stent. Normal mediastinal and hilar contours. Clear lungs.  No pleural effusion or pneumothorax. Skeletal structures are unremarkable. IMPRESSION: No active cardiopulmonary disease. Electronically Signed   By: Lajean Manes M.D.   On: 12/17/2020 13:16   DG Chest Port 1 View  Result Date: 12/20/2020 CLINICAL DATA:  Shortness of breath and chest pain. EXAM: PORTABLE CHEST 1 VIEW COMPARISON:  December 19, 2020 5:34 a.m. FINDINGS: Left-sided chest tubes are identified unchanged compared prior exam. No definite pleural line is identified in the left lung. Patchy consolidation of left lung base is unchanged. Minimal atelectasis of right lung base is noted. The mediastinal contour and cardiac silhouette are stable. There is been interval removal previously noted right jugular vascular line. IMPRESSION: Left-sided chest tubes are identified unchanged compared prior exam. No definite pleural line is identified in the left lung to suggest pneumothorax. Interval removal of right jugular vascular line. Electronically Signed   By: Abelardo Diesel M.D.   On: 12/20/2020 08:54  DG Chest Port 1 View  Result Date: 12/19/2020 CLINICAL DATA:  Pleural effusion.  History of hypertension. EXAM: PORTABLE CHEST 1 VIEW COMPARISON:  Chest x-ray dated 12/18/2020. FINDINGS: Endotracheal tube and nasogastric tube have been removed. RIGHT IJ line remains with tip positioned at the level of the upper/mid SVC. LEFT-sided chest tubes are stable in position. No pneumothorax is seen. Stable mild bibasilar atelectasis. Probable small LEFT pleural effusion. IMPRESSION: 1. Endotracheal tube and nasogastric tube have been removed. 2. LEFT-sided chest tubes are stable in position. No pneumothorax seen. 3. Stable mild bibasilar atelectasis. Probable small LEFT pleural effusion. Electronically Signed   By: Franki Cabot M.D.   On: 12/19/2020 08:11   DG  Chest Port 1 View  Result Date: 12/18/2020 CLINICAL DATA:  Respiratory dependent. Status post robotic assisted coronary artery bypass graft. EXAM: PORTABLE CHEST 1 VIEW COMPARISON:  12/16/2020 FINDINGS: Endotracheal tube is in place, tip 2.1 centimeters above the carina. Nasogastric tube is in place, tip overlying the level of the stomach. RIGHT IJ central line tip overlies the superior vena cava. There are 2 LEFT-sided chest tubes. No pneumothorax. There is significant atelectasis throughout the LEFT lung. Small LEFT pleural effusion, best seen at the lung apex. RIGHT lung is clear. No pulmonary edema. IMPRESSION: 1. Postoperative changes. 2. LEFT lung atelectasis.  Small LEFT pleural effusion. Electronically Signed   By: Nolon Nations M.D.   On: 12/18/2020 15:15   ECHOCARDIOGRAM COMPLETE  Result Date: 11/26/2020    ECHOCARDIOGRAM REPORT   Patient Name:   GAVYNN DUVALL Date of Exam: 11/26/2020 Medical Rec #:  [id number]        Height:       70.0 in Accession #:    [id number]       Weight:       196.0 lb Date of Birth:  1950-11-14       BSA:          2.069 m Patient Age:    34 years         BP:           160/87 mmHg Patient Gender: M                HR:           70 bpm. Exam Location:  Outpatient Procedure: 2D Echo, 3D Echo, Cardiac Doppler, Color Doppler and Strain Analysis Indications:    I25.700 Atherosclerosis of coronary artery bypass graft(s),                 unspecified, with unstable angina pectoris  History:        Patient has no prior history of Echocardiogram examinations.                 CAD; Risk Factors:Hypertension and Dyslipidemia.  Sonographer:    Roseanna Rainbow Referring Phys: [id number] HARRELL O LIGHTFOOT  Sonographer Comments: Global longitudinal strain was attempted. IMPRESSIONS  1. Left ventricular ejection fraction, by estimation, is 60 to 65%. The left ventricle has normal function. The left ventricle has no regional wall motion abnormalities. There is moderate left ventricular  hypertrophy. Left ventricular diastolic parameters are indeterminate. The average left ventricular global longitudinal strain is -18.9 %. The global longitudinal strain is normal.  2. Right ventricular systolic function is normal. The right ventricular size is normal. There is mildly elevated pulmonary artery systolic pressure. The estimated right ventricular systolic pressure is 88.9 mmHg.  3. The mitral valve is normal in structure. Trivial mitral  valve regurgitation. No evidence of mitral stenosis.  4. The aortic valve is tricuspid. Aortic valve regurgitation is not visualized. No aortic stenosis is present.  5. The inferior vena cava is normal in size with greater than 50% respiratory variability, suggesting right atrial pressure of 3 mmHg. FINDINGS  Left Ventricle: Left ventricular ejection fraction, by estimation, is 60 to 65%. The left ventricle has normal function. The left ventricle has no regional wall motion abnormalities. The average left ventricular global longitudinal strain is -18.9 %. The global longitudinal strain is normal. The left ventricular internal cavity size was normal in size. There is moderate left ventricular hypertrophy. Left ventricular diastolic parameters are indeterminate. Right Ventricle: The right ventricular size is normal. No increase in right ventricular wall thickness. Right ventricular systolic function is normal. There is mildly elevated pulmonary artery systolic pressure. The tricuspid regurgitant velocity is 2.85  m/s, and with an assumed right atrial pressure of 3 mmHg, the estimated right ventricular systolic pressure is 29.4 mmHg. Left Atrium: Left atrial size was normal in size. Right Atrium: Right atrial size was normal in size. Pericardium: Trivial pericardial effusion is present. Mitral Valve: The mitral valve is normal in structure. Trivial mitral valve regurgitation. No evidence of mitral valve stenosis. Tricuspid Valve: The tricuspid valve is normal in structure.  Tricuspid valve regurgitation is trivial. Aortic Valve: The aortic valve is tricuspid. Aortic valve regurgitation is not visualized. No aortic stenosis is present. Pulmonic Valve: The pulmonic valve was grossly normal. Pulmonic valve regurgitation is trivial. Aorta: The aortic root and ascending aorta are structurally normal, with no evidence of dilitation. Venous: The inferior vena cava is normal in size with greater than 50% respiratory variability, suggesting right atrial pressure of 3 mmHg. IAS/Shunts: The interatrial septum was not well visualized.  LEFT VENTRICLE PLAX 2D LVIDd:         4.45 cm     Diastology LVIDs:         2.60 cm     LV e' medial:    8.81 cm/s LV PW:         1.45 cm     LV E/e' medial:  8.9 LV IVS:        1.25 cm     LV e' lateral:   7.29 cm/s LVOT diam:     2.10 cm     LV E/e' lateral: 10.8 LV SV:         81 LV SV Index:   39          2D Longitudinal Strain LVOT Area:     3.46 cm    2D Strain GLS Avg:     -18.9 %  LV Volumes (MOD) LV vol d, MOD A2C: 71.0 ml LV vol d, MOD A4C: 59.9 ml LV vol s, MOD A2C: 19.6 ml LV vol s, MOD A4C: 23.8 ml LV SV MOD A2C:     51.4 ml LV SV MOD A4C:     59.9 ml LV SV MOD BP:      47.6 ml IVC IVC diam: 2.00 cm LEFT ATRIUM             Index       RIGHT ATRIUM           Index LA diam:        3.90 cm 1.88 cm/m  RA Area:     11.20 cm LA Vol (A2C):   34.9 ml 16.86 ml/m RA Volume:   22.40 ml  10.82 ml/m  LA Vol (A4C):   29.8 ml 14.40 ml/m LA Biplane Vol: 33.7 ml 16.28 ml/m  AORTIC VALVE             PULMONIC VALVE LVOT Vmax:   103.00 cm/s PR End Diast Vel: 2.26 msec LVOT Vmean:  73.700 cm/s LVOT VTI:    0.235 m  AORTA Ao Root diam: 2.95 cm Ao Asc diam:  3.20 cm MITRAL VALVE               TRICUSPID VALVE MV Area (PHT): 3.76 cm    TR Peak grad:   32.5 mmHg MV Decel Time: 202 msec    TR Vmax:        285.00 cm/s MR PISA:        1.01 cm MR PISA Radius: 0.40 cm    SHUNTS MV E velocity: 78.80 cm/s  Systemic VTI:  0.24 m MV A velocity: 59.60 cm/s  Systemic Diam: 2.10 cm  MV E/A ratio:  1.32 Oswaldo Milian MD Electronically signed by Oswaldo Milian MD Signature Date/Time: 11/26/2020/9:56:47 PM    Final    Discharge Instructions    Amb Referral to Cardiac Rehabilitation   Complete by: As directed    Diagnosis: CABG   CABG X ___: 1   After initial evaluation and assessments completed: Virtual Based Care may be provided alone or in conjunction with Phase 2 Cardiac Rehab based on patient barriers.: Yes     Discharge Medications: Allergies as of 12/21/2020      Reactions   Crestor [rosuvastatin] Other (See Comments)   Myalgias; also Vytorin--  PT CAN SOMEWHAT TOLERATE CRESTOR 10 mg   Isosorbide Mononitrate [isosorbide Nitrate] Other (See Comments)   Unable to tolerate Imdur at a dose of 60 mg secondary to malaise and headache   Vytorin [ezetimibe-simvastatin] Other (See Comments)   Myalgias; also rosuvastatin      Medication List    STOP taking these medications   Fish Oil 1000 MG Caps   losartan 25 MG tablet Commonly known as: COZAAR   metoprolol succinate 50 MG 24 hr tablet Commonly known as: TOPROL-XL   nitroGLYCERIN 0.4 MG SL tablet Commonly known as: Nitrostat   ranolazine 1000 MG SR tablet Commonly known as: RANEXA     TAKE these medications   allopurinol 100 MG tablet Commonly known as: ZYLOPRIM Take 100 mg by mouth daily as needed (gout).   aspirin EC 81 MG tablet Take 81 mg by mouth daily. Swallow whole.   Blink Tears 0.25 % Soln Generic drug: Polyethylene Glycol 400 Place 1-2 drops into both eyes 3 (three) times daily as needed (dry/irritated eyes.).   clopidogrel 75 MG tablet Commonly known as: PLAVIX Take 1 tablet (75 mg total) by mouth daily.   CoQ10 100 MG Caps Take 100 mg by mouth in the morning and at bedtime.   esomeprazole 40 MG capsule Commonly known as: NexIUM Take 1 capsule (40 mg total) by mouth daily before breakfast.   furosemide 40 MG tablet Commonly known as: LASIX Take 1 tablet (40 mg  total) by mouth daily. For 4 days then stop. Start taking on: December 22, 2020   loratadine 10 MG tablet Commonly known as: CLARITIN Take 10 mg by mouth daily.   Magnesium 250 MG Tabs Take 250 mg by mouth daily.   metoprolol tartrate 25 MG tablet Commonly known as: LOPRESSOR Take 0.5 tablets (12.5 mg total) by mouth 2 (two) times daily.   oxyCODONE 5 MG immediate release tablet Commonly known  as: Oxy IR/ROXICODONE Take 1 tablet (5 mg total) by mouth every 6 (six) hours as needed for severe pain.   polyethylene glycol 17 g packet Commonly known as: MIRALAX / GLYCOLAX Take 17 g by mouth daily as needed (constipation).   potassium chloride SA 20 MEQ tablet Commonly known as: KLOR-CON Take 1 tablet (20 mEq total) by mouth daily. For 4 days then stop. Start taking on: December 22, 2020   rosuvastatin 10 MG tablet Commonly known as: CRESTOR Take 10 mg by mouth daily. What changed: Another medication with the same name was removed. Continue taking this medication, and follow the directions you see here.   Vitamin B-12 2500 MCG Subl Place 2,500 mcg under the tongue daily after supper.      The patient has been discharged on:   1.Beta Blocker:  Yes [  x ]                              No   [   ]                              If No, reason:  2.Ace Inhibitor/ARB: Yes [   ]                                     No  [ x   ]                                     If No, reason:  3.Statin:   Yes [  x ]                  No  [   ]                  If No, reason:  4.Ecasa:  Yes  [ x  ]                  No   [   ]                  If No, reason:  Follow Up Appointments:  Follow-up Information    Lajuana Matte, MD Follow up on 01/01/2021.   Specialty: Cardiothoracic Surgery Why: Appointment is VIRTUAL.Marland KitchenMarland Kitchenplease do not go to office for this appointment. Appointment time is at 4:10 pm Contact information: [address] [place] [address] [phone]         Verta Ellen., NP. Go on 01/05/2021.   Specialty: Cardiology Why: Appointment time is at 8:30 am Contact information: [place] [address] [phone]        [facility] [place]. Go on 01/04/2021.   Specialty: Cardiothoracic Surgery Why: Appointment is with nurse only for chest tube suture removal. Appointement time is at 10:00 am Contact information: [place], [place]              Signed: Sharalyn Ink [facility] 12/21/2020, 10:53 AM

## 2020-12-18 NOTE — Discharge Instructions (Signed)

## 2020-12-18 NOTE — Anesthesia Procedure Notes (Signed)
Central Venous Catheter Insertion Performed by: Roderic Palau, MD, anesthesiologist Start/End4/15/2022 6:50 AM, 12/18/2020 7:05 AM Patient location: Pre-op. Preanesthetic checklist: patient identified, IV checked, site marked, risks and benefits discussed, surgical consent, monitors and equipment checked, pre-op evaluation, timeout performed and anesthesia consent Position: Trendelenburg Lidocaine 1% used for infiltration and patient sedated Hand hygiene performed , maximum sterile barriers used  and Seldinger technique used Catheter size: 8.5 Fr Total catheter length 10. Central line was placed.Sheath introducer Procedure performed using ultrasound guided technique. Ultrasound Notes:anatomy identified, needle tip was noted to be adjacent to the nerve/plexus identified, no ultrasound evidence of intravascular and/or intraneural injection and image(s) printed for medical record Attempts: 1 Following insertion, line sutured, dressing applied and Biopatch. Post procedure assessment: blood return through all ports, free fluid flow and no air  Patient tolerated the procedure well with no immediate complications.

## 2020-12-19 ENCOUNTER — Inpatient Hospital Stay (HOSPITAL_COMMUNITY): Payer: No Typology Code available for payment source

## 2020-12-19 LAB — CBC
HCT: 24.4 % — ABNORMAL LOW (ref 39.0–52.0)
HCT: 26.6 % — ABNORMAL LOW (ref 39.0–52.0)
Hemoglobin: 8.6 g/dL — ABNORMAL LOW (ref 13.0–17.0)
Hemoglobin: 9.2 g/dL — ABNORMAL LOW (ref 13.0–17.0)
MCH: 34.7 pg — ABNORMAL HIGH (ref 26.0–34.0)
MCH: 34.1 pg — ABNORMAL HIGH (ref 26.0–34.0)
MCHC: 35.2 g/dL (ref 30.0–36.0)
MCHC: 34.6 g/dL (ref 30.0–36.0)
MCV: 98.4 fL (ref 80.0–100.0)
MCV: 98.5 fL (ref 80.0–100.0)
Platelets: 101 10*3/uL — ABNORMAL LOW (ref 150–400)
Platelets: 107 10*3/uL — ABNORMAL LOW (ref 150–400)
RBC: 2.48 MIL/uL — ABNORMAL LOW (ref 4.22–5.81)
RBC: 2.7 MIL/uL — ABNORMAL LOW (ref 4.22–5.81)
RDW: 11.8 % (ref 11.5–15.5)
RDW: 12 % (ref 11.5–15.5)
WBC: 7.9 10*3/uL (ref 4.0–10.5)
WBC: 8.5 10*3/uL (ref 4.0–10.5)
nRBC: 0 % (ref 0.0–0.2)
nRBC: 0 % (ref 0.0–0.2)

## 2020-12-19 LAB — BASIC METABOLIC PANEL
Anion gap: 8 (ref 5–15)
Anion gap: 7 (ref 5–15)
BUN: 10 mg/dL (ref 8–23)
BUN: 13 mg/dL (ref 8–23)
CO2: 24 mmol/L (ref 22–32)
CO2: 23 mmol/L (ref 22–32)
Calcium: 8.7 mg/dL — ABNORMAL LOW (ref 8.9–10.3)
Calcium: 8.8 mg/dL — ABNORMAL LOW (ref 8.9–10.3)
Chloride: 104 mmol/L (ref 98–111)
Chloride: 100 mmol/L (ref 98–111)
Creatinine, Ser: 0.8 mg/dL (ref 0.61–1.24)
Creatinine, Ser: 1.12 mg/dL (ref 0.61–1.24)
GFR, Estimated: >60 mL/min (ref >60)
GFR, Estimated: >60 mL/min (ref >60)
Glucose, Bld: 114 mg/dL — ABNORMAL HIGH (ref 70–99)
Glucose, Bld: 142 mg/dL — ABNORMAL HIGH (ref 70–99)
Potassium: 4.2 mmol/L (ref 3.5–5.1)
Potassium: 4.2 mmol/L (ref 3.5–5.1)
Sodium: 136 mmol/L (ref 135–145)
Sodium: 130 mmol/L — ABNORMAL LOW (ref 135–145)

## 2020-12-19 LAB — GLUCOSE, CAPILLARY
Glucose-Capillary: 105 mg/dL — ABNORMAL HIGH (ref 70–99)
Glucose-Capillary: 110 mg/dL — ABNORMAL HIGH (ref 70–99)
Glucose-Capillary: 111 mg/dL — ABNORMAL HIGH (ref 70–99)
Glucose-Capillary: 123 mg/dL — ABNORMAL HIGH (ref 70–99)
Glucose-Capillary: 130 mg/dL — ABNORMAL HIGH (ref 70–99)
Glucose-Capillary: 130 mg/dL — ABNORMAL HIGH (ref 70–99)
Glucose-Capillary: 134 mg/dL — ABNORMAL HIGH (ref 70–99)
Glucose-Capillary: 155 mg/dL — ABNORMAL HIGH (ref 70–99)
Glucose-Capillary: 187 mg/dL — ABNORMAL HIGH (ref 70–99)

## 2020-12-19 LAB — MAGNESIUM
Magnesium: 2.3 mg/dL (ref 1.7–2.4)
Magnesium: 2.2 mg/dL (ref 1.7–2.4)

## 2020-12-19 MED ORDER — SODIUM CHLORIDE 0.9 % IV SOLN: 250.0000 mL | INTRAVENOUS | Status: DC | PRN

## 2020-12-19 MED ORDER — INSULIN ASPART 100 UNIT/ML ~~LOC~~ SOLN: 0.0000 [IU] | SUBCUTANEOUS | Status: DC

## 2020-12-19 MED ORDER — ~~LOC~~ CARDIAC SURGERY, PATIENT & FAMILY EDUCATION: Freq: Once | Status: AC

## 2020-12-19 MED ORDER — SODIUM CHLORIDE 0.9% FLUSH: 3.0000 mL | INTRAVENOUS | Status: DC | PRN

## 2020-12-19 MED ORDER — ENOXAPARIN SODIUM 40 MG/0.4ML ~~LOC~~ SOLN
40.0000 mg | Freq: Every day | SUBCUTANEOUS | Status: DC
  Administered 2020-12-19: 40 mg via SUBCUTANEOUS
  Filled 2020-12-19: qty 0.4

## 2020-12-19 MED ORDER — SODIUM CHLORIDE 0.9% FLUSH
3.0000 mL | Freq: Two times a day (BID) | INTRAVENOUS | Status: DC
  Administered 2020-12-19 – 2020-12-20 (×3): 3 mL via INTRAVENOUS

## 2020-12-19 MED ORDER — INSULIN ASPART 100 UNIT/ML ~~LOC~~ SOLN
0.0000 [IU] | SUBCUTANEOUS | Status: DC
  Administered 2020-12-19 – 2020-12-20 (×5): 2 [IU] via SUBCUTANEOUS

## 2020-12-19 NOTE — Progress Notes (Signed)
      [address]       [place] [address]             [phone]                 1 Day Post-Op Procedure(s) (LRB): XI ROBOTIC ASSISTED CORONARY ARTERY BYPASS GRAFTING (CABG); OFF PUMP TIMES 1 USING LEFT INTERNAL MAMMARY ARTERY. (Left) TRANSESOPHAGEAL ECHOCARDIOGRAM (TEE) (N/A)   Events: No events _______________________________________________________________ Vitals: BP 134/66   Pulse 83   Temp 97.9 F (36.6 C) (Core)   Resp 18   Ht 5\' 10"  (1.778 m)   Wt 87.6 kg   SpO2 98%   BMI 27.71 kg/m   - Neuro: alert NAD  - Cardiovascular: sinus  Drips: none.   CVP:  [0 mmHg-13 mmHg] 6 mmHg  - Pulm: EWOB  ABG    Component Value Date/Time   PHART 7.338 (L) 12/18/2020 1742   PCO2ART 40.2 12/18/2020 1742   PO2ART 145 (H) 12/18/2020 1742   HCO3 21.7 12/18/2020 1742   TCO2 23 12/18/2020 1742   ACIDBASEDEF 4.0 (H) 12/18/2020 1742   O2SAT 99.0 12/18/2020 1742    - Abd: ND - Extremity: warm  .Intake/Output      04/15 0701 04/16 0700 04/16 0701 04/17 0700   P.O. 150    I.V. (mL/kg) 2454.7 (28)    Blood 75    IV Piggyback 1685.9    Total Intake(mL/kg) 4365.6 (49.8)    Urine (mL/kg/hr) 2586 (1.2)    Blood 475    Chest Tube 385    Total Output 3446    Net +919.6            _______________________________________________________________ Labs: CBC Latest Ref Rng & Units 12/19/2020 12/18/2020 12/18/2020  WBC 4.0 - 10.5 K/uL 7.9 9.2 -  Hemoglobin 13.0 - 17.0 g/dL 8.6(L) 10.1(L) 9.2(L)  Hematocrit 39.0 - 52.0 % 24.4(L) 28.9(L) 27.0(L)  Platelets 150 - 400 K/uL 101(L) 108(L) -   CMP Latest Ref Rng & Units 12/19/2020 12/18/2020 12/18/2020  Glucose 70 - 99 mg/dL 114(H) 133(H) -  BUN 8 - 23 mg/dL 10 14 -  Creatinine 0.61 - 1.24 mg/dL 0.80 0.90 -  Sodium 135 - 145 mmol/L 136 136 137  Potassium 3.5 - 5.1 mmol/L 4.2 4.4 4.1  Chloride 98 - 111 mmol/L 104 107 -  CO2 22 - 32 mmol/L 24 23 -  Calcium 8.9 - 10.3 mg/dL 8.7(L) 8.7(L) -  Total Protein 6.5 - 8.1  g/dL - - -  Total Bilirubin 0.3 - 1.2 mg/dL - - -  Alkaline Phos 38 - 126 U/L - - -  AST 15 - 41 U/L - - -  ALT 0 - 44 U/L - - -    CXR: L effusion  _______________________________________________________________  Assessment and Plan: POD 1 s/p MIDCAB  Neuro: pain controlled CV: will titrate BB medication.  On plavix for off pump surgery, will go home on that.  On asp/statin Pulm: will keep CT for now Renal: creat stable GI: advancing diet Heme: Hgb down, likely dilutional ID: afebrile Endo: SSI Dispo: floor today.   Lajuana Matte 12/19/2020 7:56 AM

## 2020-12-19 NOTE — Progress Notes (Signed)
Mobility Specialist: Progress Note   12/19/20 1502  Mobility  Activity Ambulated in hall  Level of Assistance Standby assist, set-up cues, supervision of patient - no hands on  Assistive Device Four wheel walker  Distance Ambulated (ft) 470 ft  Mobility Response Tolerated well  Mobility performed by Mobility specialist  Bed Position Chair  $Mobility charge 1 Mobility   Pre-Mobility: 77 HR, 99% SpO2 Post-Mobility: 89 HR, 122/59 BP, 100% SpO2  Pt stopped for one seated and one standing break due to muscle spasms in L side/lower back. Pt otherwise asx. Pt back to chair after walk.   [facility] Josian Lanese Mobility Specialist Mobility Specialist Phone: [phone]

## 2020-12-19 NOTE — Progress Notes (Signed)
Pt ambulated x 300 feet around the unit with front wheel walker, pt tolerated well.

## 2020-12-20 ENCOUNTER — Inpatient Hospital Stay (HOSPITAL_COMMUNITY): Payer: No Typology Code available for payment source

## 2020-12-20 LAB — GLUCOSE, CAPILLARY
Glucose-Capillary: 138 mg/dL — ABNORMAL HIGH (ref 70–99)
Glucose-Capillary: 128 mg/dL — ABNORMAL HIGH (ref 70–99)
Glucose-Capillary: 129 mg/dL — ABNORMAL HIGH (ref 70–99)
Glucose-Capillary: 135 mg/dL — ABNORMAL HIGH (ref 70–99)

## 2020-12-20 LAB — BASIC METABOLIC PANEL
Anion gap: 8 (ref 5–15)
BUN: 15 mg/dL (ref 8–23)
CO2: 23 mmol/L (ref 22–32)
Calcium: 8.4 mg/dL — ABNORMAL LOW (ref 8.9–10.3)
Chloride: 94 mmol/L — ABNORMAL LOW (ref 98–111)
Creatinine, Ser: 0.98 mg/dL (ref 0.61–1.24)
GFR, Estimated: >60 mL/min (ref >60)
Glucose, Bld: 120 mg/dL — ABNORMAL HIGH (ref 70–99)
Potassium: 4 mmol/L (ref 3.5–5.1)
Sodium: 125 mmol/L — ABNORMAL LOW (ref 135–145)

## 2020-12-20 LAB — HEMOGLOBIN AND HEMATOCRIT, BLOOD
HCT: 22.2 % — ABNORMAL LOW (ref 39.0–52.0)
Hemoglobin: 7.9 g/dL — ABNORMAL LOW (ref 13.0–17.0)

## 2020-12-20 LAB — CBC
HCT: 21.4 % — ABNORMAL LOW (ref 39.0–52.0)
Hemoglobin: 7.4 g/dL — ABNORMAL LOW (ref 13.0–17.0)
MCH: 33.9 pg (ref 26.0–34.0)
MCHC: 34.6 g/dL (ref 30.0–36.0)
MCV: 98.2 fL (ref 80.0–100.0)
Platelets: 87 10*3/uL — ABNORMAL LOW (ref 150–400)
RBC: 2.18 MIL/uL — ABNORMAL LOW (ref 4.22–5.81)
RDW: 11.8 % (ref 11.5–15.5)
WBC: 6.4 10*3/uL (ref 4.0–10.5)
nRBC: 0 % (ref 0.0–0.2)

## 2020-12-20 LAB — PREPARE RBC (CROSSMATCH)

## 2020-12-20 MED ORDER — ASPIRIN EC 81 MG PO TBEC
81.0000 mg | DELAYED_RELEASE_TABLET | Freq: Every day | ORAL | Status: DC
  Administered 2020-12-20 – 2020-12-21 (×2): 81 mg via ORAL
  Filled 2020-12-20 (×2): qty 1

## 2020-12-20 MED ORDER — FUROSEMIDE 10 MG/ML IJ SOLN
40.0000 mg | Freq: Two times a day (BID) | INTRAMUSCULAR | Status: DC
  Administered 2020-12-20 (×2): 40 mg via INTRAVENOUS
  Filled 2020-12-20 (×2): qty 4

## 2020-12-20 MED ORDER — SODIUM CHLORIDE 0.9% IV SOLUTION: Freq: Once | INTRAVENOUS | Status: AC

## 2020-12-20 NOTE — Progress Notes (Signed)
Mobility Specialist: Progress Note   12/20/20 1108  Mobility  Activity Ambulated in hall  Level of Assistance Modified independent, requires aide device or extra time  Assistive Device Front wheel walker  Distance Ambulated (ft) 470 ft  Mobility Response Tolerated well  Mobility performed by Mobility specialist  Bed Position Chair  $Mobility charge 1 Mobility   Post-Mobility: 95 HR, 90% SpO2  Pt sats read 89-90% after returning to room, pt asx. Pt otherwise asx during ambulation. Pt in chair with RN present in room.   [facility] Arth Nicastro Mobility Specialist Mobility Specialist Phone: [phone]

## 2020-12-20 NOTE — Progress Notes (Addendum)
      [address]       [place] [address]             [phone]      2 Days Post-Op Procedure(s) (LRB): XI ROBOTIC ASSISTED CORONARY ARTERY BYPASS GRAFTING (CABG); OFF PUMP TIMES 1 USING LEFT INTERNAL MAMMARY ARTERY. (Left) TRANSESOPHAGEAL ECHOCARDIOGRAM (TEE) (N/A) Subjective: Feels okay this morning other than some soreness around the chest tubes.   Objective: Vital signs in last 24 hours: Temp:  [98 F (36.7 C)-98.6 F (37 C)] 98.3 F (36.8 C) (04/17 0745) Pulse Rate:  [72-95] 95 (04/17 0745) Cardiac Rhythm: Normal sinus rhythm (04/16 1910) Resp:  [13-20] 20 (04/17 0745) BP: (92-153)/(53-69) 103/60 (04/17 0745) SpO2:  [94 %-99 %] 96 % (04/17 0745) Arterial Line BP: (111-146)/(38-64) 111/38 (04/16 0900) Weight:  [91.2 kg] 91.2 kg (04/17 0635)  Hemodynamic parameters for last 24 hours: CVP:  [4 mmHg-5 mmHg] 4 mmHg  Intake/Output from previous day: 04/16 0701 - 04/17 0700 In: 840 [P.O.:840] Out: 1210 [Urine:1050; Chest Tube:160] Intake/Output this shift: Total I/O In: -  Out: 200 [Urine:200]  General appearance: alert, cooperative and no distress Heart: regular rate and rhythm, S1, S2 normal, no murmur, click, rub or gallop Lungs: clear to auscultation bilaterally Abdomen: soft, non-tender; bowel sounds normal; no masses,  no organomegaly Extremities: extremities normal, atraumatic, no cyanosis or edema Wound: clean and dry  Lab Results: Recent Labs    12/19/20 1650 12/20/20 0145  WBC 8.5 6.4  HGB 9.2* 7.4*  HCT 26.6* 21.4*  PLT 107* 87*   BMET:  Recent Labs    12/19/20 1650 12/20/20 0145  NA 130* 125*  K 4.2 4.0  CL 100 94*  CO2 23 23  GLUCOSE 142* 120*  BUN 13 15  CREATININE 1.12 0.98  CALCIUM 8.8* 8.4*    PT/INR:  Recent Labs    12/18/20 1450  LABPROT 15.7*  INR 1.3*   ABG    Component Value Date/Time   PHART 7.338 (L) 12/18/2020 1742   HCO3 21.7 12/18/2020 1742   TCO2 23 12/18/2020 1742   ACIDBASEDEF 4.0 (H)  12/18/2020 1742   O2SAT 99.0 12/18/2020 1742   CBG (last 3)  Recent Labs    12/19/20 2108 12/19/20 2347 12/20/20 0305  GLUCAP 155* 123* 138*    Assessment/Plan: S/P Procedure(s) (LRB): XI ROBOTIC ASSISTED CORONARY ARTERY BYPASS GRAFTING (CABG); OFF PUMP TIMES 1 USING LEFT INTERNAL MAMMARY ARTERY. (Left) TRANSESOPHAGEAL ECHOCARDIOGRAM (TEE) (N/A)  1. CV-NSR in the 90s, BP low normal, on plavix/325asa, will change to asa81 2. Pulm-tolerating room air with good oxygen saturation, chest tubes still in place but only 160cc/24 hours recorded 3. Renal-creatinine 0.98, electrolytes okay 4. Endo- blood glucose well controlled 5. H and H 7.4/21.4, large drop from yesterday, will order H and H to check accuracy, not much chest tube output and no other obvious bleeding 6. Urinary retention-had issues yesterday but has voided today without issue. Very small amount of blood present when foley initially pulled.   Plan: Repeat H and H. Discontinue chest tubes since not much output. Encouraged to ambulate in the halls. Home later today vs. Tomorrow.      LOS: 2 days    Elgie Collard 12/20/2020 Pt seen and examined; suggest one unit prbc; leave tubes this am--maybe out this pm. Likely home tomorrow am. [place]. Orvan Seen, [place]

## 2020-12-21 ENCOUNTER — Inpatient Hospital Stay (HOSPITAL_COMMUNITY): Payer: No Typology Code available for payment source

## 2020-12-21 LAB — CBC
HCT: 25.2 % — ABNORMAL LOW (ref 39.0–52.0)
Hemoglobin: 8.9 g/dL — ABNORMAL LOW (ref 13.0–17.0)
MCH: 33.6 pg (ref 26.0–34.0)
MCHC: 35.3 g/dL (ref 30.0–36.0)
MCV: 95.1 fL (ref 80.0–100.0)
Platelets: 96 10*3/uL — ABNORMAL LOW (ref 150–400)
RBC: 2.65 MIL/uL — ABNORMAL LOW (ref 4.22–5.81)
RDW: 12.7 % (ref 11.5–15.5)
WBC: 4.8 10*3/uL (ref 4.0–10.5)
nRBC: 0 % (ref 0.0–0.2)

## 2020-12-21 LAB — GLUCOSE, CAPILLARY
Glucose-Capillary: 117 mg/dL — ABNORMAL HIGH (ref 70–99)
Glucose-Capillary: 112 mg/dL — ABNORMAL HIGH (ref 70–99)

## 2020-12-21 LAB — BPAM RBC
Blood Product Expiration Date: 202205142359
ISSUE DATE / TIME: 202204171057
Unit Type and Rh: 5100

## 2020-12-21 LAB — TYPE AND SCREEN
ABO/RH(D): O POS
Antibody Screen: NEGATIVE
Unit division: 0

## 2020-12-21 MED ORDER — OXYCODONE HCL 5 MG PO TABS: 5.0000 mg | ORAL_TABLET | Freq: Four times a day (QID) | ORAL | 0 refills | Status: DC | PRN

## 2020-12-21 MED ORDER — COQ10 100 MG PO CAPS: 100.0000 mg | ORAL_CAPSULE | Freq: Two times a day (BID) | ORAL | Status: DC

## 2020-12-21 MED ORDER — CLOPIDOGREL BISULFATE 75 MG PO TABS: 75.0000 mg | ORAL_TABLET | Freq: Every day | ORAL | 1 refills | Status: DC

## 2020-12-21 MED ORDER — FUROSEMIDE 40 MG PO TABS
40.0000 mg | ORAL_TABLET | Freq: Every day | ORAL | Status: DC
  Administered 2020-12-21: 40 mg via ORAL
  Filled 2020-12-21: qty 1

## 2020-12-21 MED ORDER — POTASSIUM CHLORIDE CRYS ER 20 MEQ PO TBCR: 20.0000 meq | EXTENDED_RELEASE_TABLET | Freq: Every day | ORAL | 0 refills | Status: DC

## 2020-12-21 MED ORDER — ROSUVASTATIN CALCIUM 5 MG PO TABS
10.0000 mg | ORAL_TABLET | Freq: Every day | ORAL | Status: DC
  Administered 2020-12-21: 10 mg via ORAL
  Filled 2020-12-21: qty 2

## 2020-12-21 MED ORDER — FUROSEMIDE 40 MG PO TABS: 40.0000 mg | ORAL_TABLET | Freq: Every day | ORAL | 0 refills | Status: DC

## 2020-12-21 MED ORDER — LACTULOSE 10 GM/15ML PO SOLN
20.0000 g | Freq: Once | ORAL | Status: AC
  Administered 2020-12-21: 20 g via ORAL
  Filled 2020-12-21: qty 30

## 2020-12-21 MED ORDER — METOPROLOL TARTRATE 25 MG PO TABS: 12.5000 mg | ORAL_TABLET | Freq: Two times a day (BID) | ORAL | 1 refills | Status: DC

## 2020-12-21 MED ORDER — POTASSIUM CHLORIDE CRYS ER 20 MEQ PO TBCR
20.0000 meq | EXTENDED_RELEASE_TABLET | Freq: Every day | ORAL | Status: DC
  Administered 2020-12-21: 20 meq via ORAL
  Filled 2020-12-21: qty 1

## 2020-12-21 MED FILL — Lidocaine HCl Local Preservative Free (PF) Inj 2%: INTRAMUSCULAR | Qty: 15 | Status: AC

## 2020-12-21 MED FILL — Heparin Sodium (Porcine) Inj 1000 Unit/ML: INTRAMUSCULAR | Qty: 30 | Status: AC

## 2020-12-21 MED FILL — Potassium Chloride Inj 2 mEq/ML: INTRAVENOUS | Qty: 40 | Status: AC

## 2020-12-21 NOTE — Progress Notes (Signed)
CARDIAC REHAB PHASE I   PRE:  Rate/Rhythm: 83 SR  BP:  Sitting: 136/85      SaO2: 97 RA  MODE:  Ambulation: 470 ft   POST:  Rate/Rhythm: 95 SR  BP:  Sitting: 146/76    SaO2: 98 RA   Pt ambulated 462ft in hallway independently with slow steady gait. Pt able to maintain conversation throughout walk, denies CP, SOB or dizziness. Pt c/o L back pain. Pt returned to recliner. Educated on importance of site care and monitoring incisions daily. Encouraged continued IS use and walks. Pt given heart healthy diet. Reviewed restrictions and exercise guidelines. Will refer to CRP II [place].  [id number] Rufina Falco, RN BSN 12/21/2020 9:01 AM

## 2020-12-21 NOTE — Progress Notes (Addendum)
      [address]       [place] [address]             [phone]        3 Days Post-Op Procedure(s) (LRB): XI ROBOTIC ASSISTED CORONARY ARTERY BYPASS GRAFTING (CABG); OFF PUMP TIMES 1 USING LEFT INTERNAL MAMMARY ARTERY. (Left) TRANSESOPHAGEAL ECHOCARDIOGRAM (TEE) (N/A)  Subjective: Patient is passing flatus but no bowel movement yet.  Objective: Vital signs in last 24 hours: Temp:  [97.3 F (36.3 C)-98.7 F (37.1 C)] 98.1 F (36.7 C) (04/18 0539) Pulse Rate:  [80-95] 87 (04/18 0539) Cardiac Rhythm: Normal sinus rhythm (04/17 2017) Resp:  [15-20] 16 (04/18 0539) BP: (103-140)/(57-74) 134/63 (04/18 0539) SpO2:  [95 %-99 %] 99 % (04/18 0539) Weight:  [87.8 kg] 87.8 kg (04/18 0511)  Pre op weight 87.3 kg Current Weight  12/21/20 87.8 kg       Intake/Output from previous day: 04/17 0701 - 04/18 0700 In: -  Out: 4500 [Urine:4350; Chest Tube:150]   Physical Exam:  Cardiovascular: RRR Pulmonary: Clear to auscultation bilaterally Abdomen: Soft, non tender, bowel sounds present. Extremities: Trace bilateral lower extremity edema. Wounds: Left anterior dressing removed and wound is clean and dry.  No erythema or signs of infection. No drainage from chest tube wounds.  Lab Results: CBC: Recent Labs    12/20/20 0145 12/20/20 0808 12/21/20 0205  WBC 6.4  --  4.8  HGB 7.4* 7.9* 8.9*  HCT 21.4* 22.2* 25.2*  PLT 87*  --  96*   BMET:  Recent Labs    12/19/20 1650 12/20/20 0145  NA 130* 125*  K 4.2 4.0  CL 100 94*  CO2 23 23  GLUCOSE 142* 120*  BUN 13 15  CREATININE 1.12 0.98  CALCIUM 8.8* 8.4*    PT/INR:  Lab Results  Component Value Date   INR 1.3 (H) 12/18/2020   INR 1.0 12/16/2020   INR 1.03 03/21/2012   ABG:  INR: Will add last result for INR, ABG once components are confirmed Will add last 4 CBG results once components are confirmed  Assessment/Plan:  1. CV - SR with HR in the 70-80's. On DAPT and Lopressor 12.5 mg  bid. 2.  Pulmonary - On room air. Will check PA/LAT. CXR this am. Encourage incentive spirometer. 3. Volume Overload - On Lasix 40 mg IV bid. Will decrease to orally daily 4.  Expected post op acute blood loss anemia - H and H this am stable at 8.9 and 25.2. Of note, his H and H yesterday was decreased to 7.4 and 21.4? so he was given one unit of PRBC. The previous H and H was 9.2 and 26.6. 5. CBGs 129/135/112. Pre op HGA1C 4.7. Will stop accu checks. 6. Thrombocytopenia-platelets this am up to 96,000 7. LOC constipation 8. As discussed with surgeon, ok to discharge  Aniaya Bacha M ZimmermanPA-C 12/21/2020,7:07 AM

## 2020-12-22 ENCOUNTER — Other Ambulatory Visit (INDEPENDENT_AMBULATORY_CARE_PROVIDER_SITE_OTHER): Payer: Self-pay | Admitting: Internal Medicine

## 2020-12-22 ENCOUNTER — Encounter (HOSPITAL_COMMUNITY): Payer: Self-pay | Admitting: Thoracic Surgery (Cardiothoracic Vascular Surgery)

## 2020-12-22 ENCOUNTER — Telehealth: Payer: Self-pay

## 2020-12-22 ENCOUNTER — Ambulatory Visit: Payer: Medicare Other | Admitting: Cardiology

## 2020-12-22 NOTE — Telephone Encounter (Signed)
-----   Message from Nani Skillern, [place] sent at 12/22/2020 10:35 AM EDT ----- Regarding: RE: CoQ10 I did resume it at discharge so it is ok for him to take. He may take 1 table to CoQ10 daily. Thank you.  ----- Message ----- From: Donnella Sham, RN Sent: 12/22/2020  10:18 AM EDT To: Nani Skillern, PA-C Subject: CoQ10                                          Hey,  Had a lengthy conversation with his wife about medications.  She was asking if he could continue to take the CoQ10 or not. She was not sure whether you said he could or not. And should he take 1 tablet or 2?  Thanks,  Caryl Pina

## 2020-12-22 NOTE — Telephone Encounter (Signed)
Patient's wife made aware of CoQ10 medication restart after discharge.  She acknowledged receipt that he can take one daily.

## 2020-12-23 MED FILL — Sodium Chloride IV Soln 0.9%: INTRAVENOUS | Qty: 3000 | Status: AC

## 2020-12-28 ENCOUNTER — Telehealth: Payer: Self-pay | Admitting: *Deleted

## 2020-12-28 ENCOUNTER — Other Ambulatory Visit (INDEPENDENT_AMBULATORY_CARE_PROVIDER_SITE_OTHER): Payer: Self-pay | Admitting: Internal Medicine

## 2020-12-28 ENCOUNTER — Other Ambulatory Visit: Payer: Self-pay | Admitting: Surgical

## 2020-12-28 MED ORDER — OXYCODONE HCL 5 MG PO TABS: 5.0000 mg | ORAL_TABLET | Freq: Three times a day (TID) | ORAL | 0 refills | Status: AC | PRN

## 2020-12-28 NOTE — Telephone Encounter (Signed)
Ms. Murguia made aware of medication refill sent in to patient's preferred pharmacy.

## 2020-12-28 NOTE — Progress Notes (Signed)
Renewed OXYCODONE 5 mg q 8 prn #15  John Giovanni, PA-C

## 2020-12-28 NOTE — Telephone Encounter (Signed)
-----   Message from John Giovanni, [place] sent at 12/28/2020  1:58 PM EDT ----- Regarding: RE: pain medication refill I renewed Oxycodone 5 mg q 8 prn # 15   Thanks   Patrick Jupiter ----- Message ----- From: Ladon Applebaum, RN Sent: 12/28/2020  10:10 AM EDT To: John Giovanni, PA-C Subject: pain medication refill                         Patrick Jupiter,   Mr. Earnhart is requesting a refill of oxycodone. S/p robotic CABG 4/15 by [facility]. Mainly incisional pain. He has started to wean himself a bit by taking only three a day and is using Tylenol in between doses. If you approve, he is using the [facility] in [place] off [address].  Thanks,  [employer]

## 2020-12-29 ENCOUNTER — Ambulatory Visit (INDEPENDENT_AMBULATORY_CARE_PROVIDER_SITE_OTHER): Payer: No Typology Code available for payment source | Admitting: Internal Medicine

## 2021-01-01 ENCOUNTER — Telehealth (INDEPENDENT_AMBULATORY_CARE_PROVIDER_SITE_OTHER): Payer: Self-pay | Admitting: Thoracic Surgery (Cardiothoracic Vascular Surgery)

## 2021-01-01 ENCOUNTER — Other Ambulatory Visit: Payer: Self-pay

## 2021-01-01 DIAGNOSIS — Z9889 Other specified postprocedural states: Secondary | ICD-10-CM

## 2021-01-01 NOTE — Progress Notes (Signed)
     [address]       [place] [address]             [phone]       Patient: Home Provider: Office Consent for Telemedicine visit obtained.  Today's visit was completed via a real-time telehealth (see specific modality noted below). The patient/authorized person provided oral consent at the time of the visit to engage in a telemedicine encounter with the present provider at [facility]. The patient/authorized person was informed of the potential benefits, limitations, and risks of telemedicine. The patient/authorized person expressed understanding that the laws that protect confidentiality also apply to telemedicine. The patient/authorized person acknowledged understanding that telemedicine does not provide emergency services and that he or she would need to call 911 or proceed to the nearest hospital for help if such a need arose.  . Total time spent in the clinical discussion 10 minutes. . Telehealth Modality: Phone visit (audio only)  I had a telephone visit with [name].  Overall doing well.  He is ambulating without pain or difficulty.  I will see him back in 1 month with a CXR.  Nycere Presley Bary Leriche

## 2021-01-04 ENCOUNTER — Other Ambulatory Visit: Payer: Self-pay

## 2021-01-04 ENCOUNTER — Ambulatory Visit (INDEPENDENT_AMBULATORY_CARE_PROVIDER_SITE_OTHER): Payer: Self-pay

## 2021-01-04 DIAGNOSIS — Z4802 Encounter for removal of sutures: Secondary | ICD-10-CM

## 2021-01-04 NOTE — Progress Notes (Signed)
Patient arrived for nurse visit to remove suture/staples post- procedure RATS CABG x1 with Dr. Kipp Brood 12/18/20.  Two sutures removed from left breast and chest with no signs/ symptoms of infection noted. Incisions look to be healing nicely. Patient tolerated procedure well.  Patient/ family instructed to keep the incision sites clean and dry.  Patient/ family acknowledged instructions given.   Patient stated that he is taking about 2 oxycodone's daily for pain control and asked if he could wean or just stop taking it.  Advised that if he did not need the medication, he does not need to take it. No weaning necessary.  They acknowledged receipt.

## 2021-01-04 NOTE — Progress Notes (Signed)
Cardiology Office Note  Date: 01/05/2021   ID: Juan, Hobbs 12-28-1950, MRN [id number]  PCP:  [facility]  Cardiologist:  Rozann Lesches, MD Electrophysiologist:  None   Chief Complaint: Status post CABG x1/ LIMA-LAD  History of Present Illness: [name] is a 70 y.o. male with a history of CAD, accelerating angina, mixed hyperlipidemia, statin intolerance, hyperlipidemia, hypertension . Last seen in office by Dr. Domenic Polite 11/04/2020.  Reported recurrent exertional chest tightness consistent with angina.  Sometimes occurring at night and was taking nitroglycerin with improvement.  History of GERD and Barrett's esophagus and had difficulty distinguishing reflux and angina.  He had symptoms after walking in from the parking lot on that visit.  He has stopped his Crestor about a month prior due to complaints of abdominal bloating.  History of prior statin intolerance including Vytorin and Lipitor.  Had significantly abnormal lipids from previous lab work August 2021 with LDL of 221.  Prior ischemic testing January 2020.  Had a previous DES to LAD 2007 and subsequent ISR later through the [place] about 5 years prior to visit.  Risk and benefits of diagnostic cardiac catheterization were discussed and patient agreed to proceed.  Plans were to rechallenge with Crestor and if he did not tolerate and/or could not get his LDL back down to goal he would be a good candidate for PCSK9 inhibitor.  He was back on Nexium and to follow with Dr. Laural Golden for GERD and Barrett's esophagus.  He had a subsequent cardiac catheterization on 11/11/2020 demonstrating ostial proximal LAD lesion 90%, proximal LAD to mid LAD prior stents 100% stenosed.  Mid RCA lesion 25% stenosed.  Left ventricular systolic function was normal, LVEDP was normal.  EF 55 to 65%.  No aortic valve stenosis.  Plans for CTS consult for possible CABG with LIMA to LAD.  He had robotic assisted left videoed thoracoscopy,  robotic assisted left internal mammary artery harvest.  Left anterior minithoracotomy, off-pump coronary bypass grafting x1 with LIMA to LAD with intercostal nerve block by Dr. Kipp Brood on 12/18/2020.    He is here for follow-up today status post recent bypass surgery.  He is feeling well other than some soreness from the surgery.  He states he mowed his yard yesterday and is a little sore today as a result.  Otherwise he denies any anginal symptoms or exertional symptoms.  No DOE or SOB, no lightheadedness, dizziness, presyncopal or syncopal episodes.  No CVA or TIA-like symptoms, no PND, orthopnea.  No bleeding issues.  No claudication-like symptoms.  No DVT or PE-like symptoms.  States he notices some minor lower extremity edema.  States he continues to have some abdominal bloating and discomfort for which he is seeing GI for and has an upcoming visit on May 5 for follow-up.  Denies any issues with incision and chest tube sites.  His wife states he received a blood transfusion for anemia during recent hospital stay.  They are interested in getting follow-up lab work for his elevated glucose, anemia, and follow-up cholesterol levels after restarting Crestor.  Past Medical History:  Diagnosis Date  . Anginal pain ([place])   . Barrett's esophagus   . Coronary artery disease    ACS in 02/2006: DES to subtotal LAD; 50% D1; 30% CX; normal RCA; normal EF;repeat cath in 06/2006: Residual 80% small D1 and 70% D2-medical therapy advised  . GERD (gastroesophageal reflux disease)   . Gout   . History of hiatal hernia   .  History of pneumonia   . Hyperlipidemia   . Hypertension   . Low back pain   . Pneumonia     Past Surgical History:  Procedure Laterality Date  . CARDIAC CATHETERIZATION    . COLONOSCOPY    . CORONARY ANGIOPLASTY WITH STENT PLACEMENT  02/17/2012    proximal LAD  . ESOPHAGOGASTRODUODENOSCOPY N/A 07/24/2014   Procedure: ESOPHAGOGASTRODUODENOSCOPY (EGD);  Surgeon: Rogene Houston, MD;   [facility];  Service: Endoscopy;  Laterality: N/A;  210  . LEFT HEART CATH AND CORONARY ANGIOGRAPHY N/A 11/11/2020   Procedure: LEFT HEART CATH AND CORONARY ANGIOGRAPHY;  Surgeon: Jettie Booze, MD;  Location: [facility];  Service: Cardiovascular;  Laterality: N/A;  . MALONEY DILATION N/A 07/24/2014   Procedure: Venia Minks DILATION;  Surgeon: Rogene Houston, MD;  [facility];  Service: Endoscopy;  Laterality: N/A;  . PERCUTANEOUS CORONARY STENT INTERVENTION (PCI-S) N/A 02/17/2012   Procedure: PERCUTANEOUS CORONARY STENT INTERVENTION (PCI-S);  Surgeon: Burnell Blanks, MD;  Location: [facility] CATH LAB;  Service: Cardiovascular;  Laterality: N/A;  . TEE WITHOUT CARDIOVERSION N/A 12/18/2020   Procedure: TRANSESOPHAGEAL ECHOCARDIOGRAM (TEE);  Surgeon: Lajuana Matte, MD;  Location: [facility];  Service: Open Heart Surgery;  Laterality: N/A;    Current Outpatient Medications  Medication Sig Dispense Refill  . allopurinol (ZYLOPRIM) 100 MG tablet Take 100 mg by mouth daily as needed (gout).    Marland Kitchen aspirin EC 81 MG tablet Take 81 mg by mouth daily. Swallow whole.    . clopidogrel (PLAVIX) 75 MG tablet Take 1 tablet (75 mg total) by mouth daily. 60 tablet 1  . Coenzyme Q10 (CO Q-10) 100 MG CAPS Take 1 capsule by mouth every morning.    . Cyanocobalamin (VITAMIN B12 PO) Take 1 tablet by mouth daily.    Marland Kitchen esomeprazole (NEXIUM) 40 MG capsule TAKE 1 CAPSULE BY MOUTH ONCE DAILY BEFORE BREAKFAST 30 capsule 0  . loratadine (CLARITIN) 10 MG tablet Take 10 mg by mouth daily.    . Magnesium 250 MG TABS Take 250 mg by mouth daily.    . metoprolol tartrate (LOPRESSOR) 25 MG tablet Take 0.5 tablets (12.5 mg total) by mouth 2 (two) times daily. 30 tablet 1  . nitroGLYCERIN (NITROSTAT) 0.4 MG SL tablet Place 0.4 mg under the tongue as needed for chest pain.    . polyethylene glycol (MIRALAX / GLYCOLAX) 17 g packet Take 17 g by mouth daily as needed (constipation).    . Polyethylene  Glycol 400 (BLINK TEARS) 0.25 % SOLN Place 1-2 drops into both eyes 3 (three) times daily as needed (dry/irritated eyes.).    Marland Kitchen rosuvastatin (CRESTOR) 10 MG tablet Take 10 mg by mouth daily.     No current facility-administered medications for this visit.   Allergies:  Crestor [rosuvastatin], Isosorbide mononitrate [isosorbide nitrate], and Vytorin [ezetimibe-simvastatin]   Social History: The patient  reports that he quit smoking about 22 years ago. His smoking use included cigarettes. He has a 52.50 pack-year smoking history. He has never used smokeless tobacco. He reports current alcohol use of about 21.0 - 28.0 standard drinks of alcohol per week. He reports that he does not use drugs.   Family History: The patient's family history includes Heart disease in his sister and son; Hyperlipidemia in his father and mother.   ROS:  Please see the history of present illness. Otherwise, complete review of systems is positive for none.  All other systems are reviewed and negative.  Physical Exam: VS:  BP 122/64   Pulse 72   Ht 5\' 10"  (1.778 m)   Wt 187 lb 3.2 oz (84.9 kg)   SpO2 99%   BMI 26.86 kg/m , BMI Body mass index is 26.86 kg/m.  Wt Readings from Last 3 Encounters:  01/05/21 187 lb 3.2 oz (84.9 kg)  12/21/20 193 lb 9 oz (87.8 kg)  12/16/20 192 lb 8 oz (87.3 kg)    General: Patient appears comfortable at rest. Neck: Supple, no elevated JVP or carotid bruits, no thyromegaly. Lungs: Clear to auscultation, nonlabored breathing at rest. Cardiac: Regular rate and rhythm, no S3 or significant systolic murmur, no pericardial rub. Extremities: No pitting edema, distal pulses 2+. Skin: Warm and dry.  Incision sites and chest tube site clean and dry without signs of infection. Musculoskeletal: No kyphosis. Neuropsychiatric: Alert and oriented x3, affect grossly appropriate.  ECG:  EKG December 19, 2020 normal sinus rhythm rate of 78.  Minimal voltage criteria for LVH.  Recent  Labwork: 12/16/2020: ALT 27; AST 23 12/19/2020: Magnesium 2.2 12/20/2020: BUN 15; Creatinine, Ser 0.98; Potassium 4.0; Sodium 125 12/21/2020: Hemoglobin 8.9; Platelets 96     Component Value Date/Time   CHOL 185 01/30/2013 1311   TRIG 261 (H) 01/30/2013 1311   HDL 46 01/30/2013 1311   CHOLHDL 4.0 01/30/2013 1311   VLDL 52 (H) 01/30/2013 1311   LDLCALC 87 01/30/2013 1311   LDLDIRECT 120 (H) 01/03/2013 0845    Other Studies Reviewed Today:  11/26/2020 LEFT HEART CATH AND CORONARY ANGIOGRAPHY    Conclusion    Ost LAD to Prox LAD lesion is 90% stenosed.  Prox LAD to Mid LAD prior stents are 100% stenosed.  Mid RCA lesion is 25% stenosed.  The left ventricular systolic function is normal.  LV end diastolic pressure is normal.  The left ventricular ejection fraction is 55-65% by visual estimate.  There is no aortic valve stenosis.   Recurrent restenosis of multiple LAD stents, 2013 ([facility]), 2016 [facility]).  Plan for TCTS consult for possible CABG, LIMA to LAD.  Diagnostic Dominance: Right    Intervention    Assessment and Plan:  1. CAD in native artery   2. S/P coronary artery bypass graft x 1   3. Mixed hyperlipidemia   4. Essential hypertension   5. Anemia, unspecified type   6. Elevated glucose level    1. CAD in native artery Previous DES to LAD 2017 with subsequent in-stent restenosis later.  Recent complaints of chest pain on exertion.  Cardiac catheterization on 11/26/2020 demonstrated ostial to proximal LAD 90%, proximal to mid LAD stents 100% stenosed.  Mid RCA 25% LV SF normal.  LVEDP normal.  EF 55-65.   CTS was consulted for possible LIMA to LAD.  Continue aspirin 81 mg daily, Plavix 75 mg daily, metoprolol 12.5 mg p.o. daily, nitroglycerin as needed,  2. S/P coronary artery bypass graft x 1 He had robotic assisted left videoed thoracoscopy, robotic assisted left internal mammary artery harvest.  Left anterior minithoracotomy, off-pump coronary  bypass grafting x1 with LIMA to LAD with intercostal nerve block by Dr. Kipp Brood on 12/18/2020.   3. Mixed hyperlipidemia Recently restarted on Crestor.  Please get follow-up lipid panel and LFTs.  Continue Crestor 10 milligrams p.o. daily  4. Essential hypertension Blood pressure well controlled today at 122/64.  Continue metoprolol 12.5 mg p.o. twice daily.   5.  Anemia Anemia during hospital visit.  Globin was down to 7.6.  He received 1  packed red blood cell transfusion increasing hemoglobin to 8.9.  Please get follow-up CBC.  6.  Elevated glucose levels Patient had elevated glucose levels during hospital stay.  Patient and wife would like to get follow-up lab work to recheck.  Please get a basic metabolic panel and magnesium.  Medication Adjustments/Labs and Tests Ordered: Current medicines are reviewed at length with the patient today.  Concerns regarding medicines are outlined above.   Disposition: Follow-up with Dr. Domenic Polite or APP 3 months  Signed, Levell July, NP 01/05/2021 9:11 AM    [facility], [place], [place] [address] Phone: [phone]; Fax: [phone]

## 2021-01-05 ENCOUNTER — Encounter: Payer: Self-pay | Admitting: Family Medicine

## 2021-01-05 ENCOUNTER — Ambulatory Visit (INDEPENDENT_AMBULATORY_CARE_PROVIDER_SITE_OTHER): Payer: No Typology Code available for payment source | Admitting: Family Medicine

## 2021-01-05 VITALS — BP 122/64 | HR 72 | Ht 70.0 in | Wt 187.2 lb

## 2021-01-05 DIAGNOSIS — D649 Anemia, unspecified: Secondary | ICD-10-CM

## 2021-01-05 DIAGNOSIS — E782 Mixed hyperlipidemia: Secondary | ICD-10-CM

## 2021-01-05 DIAGNOSIS — I10 Essential (primary) hypertension: Secondary | ICD-10-CM | POA: Diagnosis not present

## 2021-01-05 DIAGNOSIS — I2510 Atherosclerotic heart disease of native coronary artery without angina pectoris: Secondary | ICD-10-CM | POA: Diagnosis not present

## 2021-01-05 DIAGNOSIS — Z951 Presence of aortocoronary bypass graft: Secondary | ICD-10-CM

## 2021-01-05 DIAGNOSIS — R7309 Other abnormal glucose: Secondary | ICD-10-CM

## 2021-01-05 NOTE — Patient Instructions (Addendum)
Medication Instructions:  Continue all current medications.  Labwork:  CBC, BMET,Mg, FLP, LFT - orders given today.   Office will contact with results via phone or letter.    Testing/Procedures: none  Follow-Up: 3 months   Any Other Special Instructions Will Be Listed Below (If Applicable).  If you need a refill on your cardiac medications before your next appointment, please call your pharmacy.

## 2021-01-06 ENCOUNTER — Telehealth: Payer: Self-pay | Admitting: *Deleted

## 2021-01-06 ENCOUNTER — Other Ambulatory Visit: Payer: Self-pay | Admitting: Physician Assistant

## 2021-01-06 MED ORDER — TRAMADOL HCL 50 MG PO TABS: 50.0000 mg | ORAL_TABLET | Freq: Two times a day (BID) | ORAL | 0 refills | Status: DC | PRN

## 2021-01-06 NOTE — Progress Notes (Unsigned)
      [address]       [place] [address]             [phone]        I refilled his pain medication. Tramadol 50mg  q 12 hours.    #15 tabs   Nicholes Rough, PA-C

## 2021-01-06 NOTE — Telephone Encounter (Signed)
Patient's wife, Loletha Carrow, contacted the office for Mr. Cavell requesting a refill of Oxycodone. Patient is c/o left chest incisional pain. Patient is s/p Robotic CABG 4/15 by Dr. Kipp Brood. Patient reports taking 1-2 Oxycodone's a day with Tylenol in between doses. Per T. Harriet Pho, [place], prescription for Tramadol sent into patient's preferred pharmacy of [facility]. Vickie notified of new prescription. No further questions at this time.

## 2021-01-07 ENCOUNTER — Ambulatory Visit (INDEPENDENT_AMBULATORY_CARE_PROVIDER_SITE_OTHER): Payer: Medicare Other | Admitting: Internal Medicine

## 2021-01-15 ENCOUNTER — Encounter: Payer: Self-pay | Admitting: *Deleted

## 2021-01-15 ENCOUNTER — Telehealth: Payer: Self-pay | Admitting: Family Medicine

## 2021-01-15 NOTE — Telephone Encounter (Signed)
Spouse verbalized that pt has felt weak/fatigued since starting Metoprolol Tartrate 12.5 mg BID. Pt denies CP, SOB, dizziness.  Pt has kept a track of BP for the week: 5/9-127/70 5/10-108/72 5/11-95/61 5/12-112/76 5/13-119/75 (3:55 pm)  Spouse would like to know if she can just give pt Metoprolol 12.5 mg once daily. Please advise

## 2021-01-15 NOTE — Telephone Encounter (Signed)
Vicki-spouse called stating that patient is fatigue, weak and running a low BP.  93/64 @ 1230pm. States that Dr. Kipp Brood made a change to his BP medication. [phone]

## 2021-01-15 NOTE — Telephone Encounter (Signed)
It usually takes a couple of weeks of being on the medication to get used to it.  If after 2 weeks he still feels the same way or his blood pressure is staying below 622 systolic to call Dr. Abran Duke office for further instructions.  Metoprolol is usually a twice a day dosage.  If he cannot tolerate twice a day dosage she can cut back to once a day until she calls Dr. Abran Duke office for further instructions.

## 2021-01-16 LAB — CBC
HCT: 38.1 % — ABNORMAL LOW (ref 38.5–50.0)
Hemoglobin: 12.7 g/dL — ABNORMAL LOW (ref 13.2–17.1)
MCH: 31.1 pg (ref 27.0–33.0)
MCHC: 33.3 g/dL (ref 32.0–36.0)
MCV: 93.4 fL (ref 80.0–100.0)
MPV: 10 fL (ref 7.5–12.5)
Platelets: 147 10*3/uL (ref 140–400)
RBC: 4.08 10*6/uL — ABNORMAL LOW (ref 4.20–5.80)
RDW: 12.5 % (ref 11.0–15.0)
WBC: 3.2 10*3/uL — ABNORMAL LOW (ref 3.8–10.8)

## 2021-01-16 LAB — LIPID PANEL
Cholesterol: 148 mg/dL (ref <200)
HDL: 50 mg/dL (ref >=40)
LDL Cholesterol (Calc): 75 mg/dL (calc)
Non-HDL Cholesterol (Calc): 98 mg/dL (calc) (ref <130)
Total CHOL/HDL Ratio: 3 (calc) (ref <5.0)
Triglycerides: 155 mg/dL — ABNORMAL HIGH (ref <150)

## 2021-01-16 LAB — HEPATIC FUNCTION PANEL
AG Ratio: 2.2 (calc) (ref 1.0–2.5)
ALT: 15 U/L (ref 9–46)
AST: 19 U/L (ref 10–35)
Albumin: 4.7 g/dL (ref 3.6–5.1)
Alkaline phosphatase (APISO): 64 U/L (ref 35–144)
Bilirubin, Direct: 0.1 mg/dL (ref 0.0–0.2)
Globulin: 2.1 g/dL (calc) (ref 1.9–3.7)
Indirect Bilirubin: 0.4 mg/dL (calc) (ref 0.2–1.2)
Total Bilirubin: 0.5 mg/dL (ref 0.2–1.2)
Total Protein: 6.8 g/dL (ref 6.1–8.1)

## 2021-01-16 LAB — BASIC METABOLIC PANEL
BUN: 9 mg/dL (ref 7–25)
CO2: 26 mmol/L (ref 20–32)
Calcium: 10.2 mg/dL (ref 8.6–10.3)
Chloride: 98 mmol/L (ref 98–110)
Creat: 0.8 mg/dL (ref 0.70–1.25)
Glucose, Bld: 77 mg/dL (ref 65–99)
Potassium: 4.9 mmol/L (ref 3.5–5.3)
Sodium: 135 mmol/L (ref 135–146)

## 2021-01-16 LAB — MAGNESIUM: Magnesium: 2.1 mg/dL (ref 1.5–2.5)

## 2021-01-18 NOTE — Telephone Encounter (Signed)
Wife Olegario Shearer) notified & verbalized understanding.  Already has appt to see [facility] on 01/22/2021.

## 2021-01-20 ENCOUNTER — Telehealth: Payer: Self-pay | Admitting: *Deleted

## 2021-01-20 NOTE — Telephone Encounter (Signed)
Pt voiced understanding

## 2021-01-20 NOTE — Telephone Encounter (Signed)
-----   Message from Massie Maroon, [place] sent at 01/20/2021  9:40 AM EDT -----  ----- Message ----- From: Massie Maroon, CMA Sent: 01/19/2021   8:33 AM EDT To: Laurine Blazer, LPN   ----- Message ----- From: Theora Gianotti, NP Sent: 01/18/2021   3:44 PM EDT To: [facility] Triage  Blood counts stable.  WBC slightly low, but was nl 4 wks ago.   LDL cholesterol at goal.  TG improved - now 155, down from 261. Liver enzymes are normal. Magnesium and other electrolytes are normal. Kidney function is normal. Glucose normal.

## 2021-01-21 ENCOUNTER — Other Ambulatory Visit: Payer: Self-pay | Admitting: Thoracic Surgery (Cardiothoracic Vascular Surgery)

## 2021-01-21 DIAGNOSIS — I25119 Atherosclerotic heart disease of native coronary artery with unspecified angina pectoris: Secondary | ICD-10-CM

## 2021-01-22 ENCOUNTER — Ambulatory Visit (INDEPENDENT_AMBULATORY_CARE_PROVIDER_SITE_OTHER): Payer: Self-pay | Admitting: Thoracic Surgery (Cardiothoracic Vascular Surgery)

## 2021-01-22 ENCOUNTER — Other Ambulatory Visit: Payer: Self-pay

## 2021-01-22 ENCOUNTER — Encounter: Payer: Self-pay | Admitting: Thoracic Surgery (Cardiothoracic Vascular Surgery)

## 2021-01-22 ENCOUNTER — Ambulatory Visit
Admission: RE | Admit: 2021-01-22 | Discharge: 2021-01-22 | Disposition: A | Discharge status: Home / Self Care | Payer: No Typology Code available for payment source | Source: Ambulatory Visit | Attending: Thoracic Surgery (Cardiothoracic Vascular Surgery) | Admitting: Thoracic Surgery (Cardiothoracic Vascular Surgery)

## 2021-01-22 VITALS — BP 126/75 | HR 76 | Resp 20 | Ht 70.0 in | Wt 181.0 lb

## 2021-01-22 DIAGNOSIS — Z951 Presence of aortocoronary bypass graft: Secondary | ICD-10-CM

## 2021-01-22 DIAGNOSIS — I2510 Atherosclerotic heart disease of native coronary artery without angina pectoris: Secondary | ICD-10-CM | POA: Diagnosis not present

## 2021-01-22 DIAGNOSIS — Z9889 Other specified postprocedural states: Secondary | ICD-10-CM | POA: Diagnosis not present

## 2021-01-22 DIAGNOSIS — I25119 Atherosclerotic heart disease of native coronary artery with unspecified angina pectoris: Secondary | ICD-10-CM

## 2021-01-22 MED ORDER — TRAMADOL HCL 50 MG PO TABS: 50.0000 mg | ORAL_TABLET | Freq: Two times a day (BID) | ORAL | 0 refills | Status: DC | PRN

## 2021-01-22 NOTE — Progress Notes (Signed)
      [address]       [place] [address]             [phone]        Legrand J Gaughan [place] Medical Record #[id number] Date of Birth: [birth year]  Referring: Satira Sark, MD Primary Care: [facility] Primary Cardiologist:Samuel Domenic Polite, MD  Reason for visit:   follow-up  History of Present Illness:     Mr. Massie comes in for his 1 month follow-up.  Overall he is doing well.  He is back to playing golf.  Since becoming more active he has no evidence of chest wall pain.  This is relieved with Tylenol and tramadol.  Physical Exam: BP 126/75 (BP Location: Right Arm, Patient Position: Sitting)   Pulse 76   Resp 20   Ht 5\' 10"  (1.778 m)   Wt 181 lb (82.1 kg)   SpO2 98% Comment: RA  BMI 25.97 kg/m   Alert NAD Incision clean, well-healed.   Abdomen soft, ND No peripheral edema   Diagnostic Studies & Laboratory data: CXR: Clear     Assessment / Plan:   70 year old male status post robotic assisted risks with off-pump CABG x1.  He is doing quite well.  I instructed him to take Tylenol scheduled for the next 2 weeks, and given a prescription for tramadol as needed.  He is cleared for cardiac rehab. He will follow-up as needed.   Lajuana Matte 01/22/2021 1:26 PM

## 2021-02-16 ENCOUNTER — Ambulatory Visit (INDEPENDENT_AMBULATORY_CARE_PROVIDER_SITE_OTHER): Payer: No Typology Code available for payment source | Admitting: Internal Medicine

## 2021-03-18 ENCOUNTER — Encounter (INDEPENDENT_AMBULATORY_CARE_PROVIDER_SITE_OTHER): Payer: Self-pay

## 2021-04-19 ENCOUNTER — Other Ambulatory Visit: Payer: Self-pay | Admitting: Physician Assistant

## 2021-04-19 ENCOUNTER — Telehealth: Payer: Self-pay | Admitting: Cardiology

## 2021-04-19 MED ORDER — CLOPIDOGREL BISULFATE 75 MG PO TABS: 75.0000 mg | ORAL_TABLET | Freq: Every day | ORAL | 1 refills | Status: DC

## 2021-04-19 NOTE — Telephone Encounter (Signed)
*  STAT* If patient is at the pharmacy, call can be transferred to refill team.   1. Which medications need to be refilled? (please list name of each medication and dose if known) Plavix '75mg'$   2. Which pharmacy/location (including street and city if local pharmacy) is medication to be sent to?  [facility]  3. Do they need a 30 day or 90 day supply? 30 then they will do mail order after appt w/ Dr. Domenic Polite

## 2021-04-27 ENCOUNTER — Encounter: Payer: Self-pay | Admitting: Cardiology

## 2021-04-27 NOTE — Progress Notes (Signed)
Cardiology Office Note  Date: 04/28/2021   ID: [name], [name] [birth year], MRN [id number]  PCP:  Myrla Halsted, MD  Cardiologist:  Rozann Lesches, MD Electrophysiologist:  None   Chief Complaint  Patient presents with   Cardiac follow-up     History of Present Illness: [name] is a 70 y.o. male last seen in May by Mr. Leonides Sake NP.  He is here with his wife for a follow-up visit. He is status post off-pump LIMA to LAD with Dr. Kipp Brood in April.  He did not pursue cardiac rehabilitation, was initially walking for exercise, now playing golf 3 days a week.  He has gained about 10 pounds.  He tells me that he was not able to tolerate Crestor 5 mg daily due to progressive leg pain, stopped it well back in May.  His last LDL was 75 in May.  We discussed getting a follow-up lipid panel and determining the next step.  He has a previous history of intolerance to Zocor, Zetia, and Lipitor.  LDL was as high as 221 in August 2021.  He also states that he felt very weak and had symptomatic hypotension on Lopressor 12.5 mg twice daily and stop that medication as well.  Blood pressure was elevated in clinic today but I reviewed his home checks over the last week with systolics of [id number] at the highest.  He had been on Toprol-XL prior to surgery.   Past Medical History:  Diagnosis Date   Barrett's esophagus    Coronary artery disease    ACS in 02/2006: DES to subtotal LAD; repeat cath in 06/2006: Residual 80% small D1 and 70% D2-medical therapy advised; off-pump LIMA to LAD April 2022   GERD (gastroesophageal reflux disease)    Gout    History of hiatal hernia    History of pneumonia    Hyperlipidemia    Hypertension    Low back pain     Past Surgical History:  Procedure Laterality Date   CARDIAC CATHETERIZATION     COLONOSCOPY     CORONARY ANGIOPLASTY WITH STENT PLACEMENT  02/17/2012    proximal LAD   ESOPHAGOGASTRODUODENOSCOPY N/A 07/24/2014   Procedure:  ESOPHAGOGASTRODUODENOSCOPY (EGD);  Surgeon: Rogene Houston, MD;  [facility];  Service: Endoscopy;  Laterality: N/A;  210   LEFT HEART CATH AND CORONARY ANGIOGRAPHY N/A 11/11/2020   Procedure: LEFT HEART CATH AND CORONARY ANGIOGRAPHY;  Surgeon: Jettie Booze, MD;  Location: [facility] CV LAB;  Service: Cardiovascular;  Laterality: N/A;   MALONEY DILATION N/A 07/24/2014   Procedure: Venia Minks DILATION;  Surgeon: Rogene Houston, MD;  [facility];  Service: Endoscopy;  Laterality: N/A;   PERCUTANEOUS CORONARY STENT INTERVENTION (PCI-S) N/A 02/17/2012   Procedure: PERCUTANEOUS CORONARY STENT INTERVENTION (PCI-S);  Surgeon: Burnell Blanks, MD;  Location: [facility] CATH LAB;  Service: Cardiovascular;  Laterality: N/A;   TEE WITHOUT CARDIOVERSION N/A 12/18/2020   Procedure: TRANSESOPHAGEAL ECHOCARDIOGRAM (TEE);  Surgeon: Lajuana Matte, MD;  Location: [facility];  Service: Open Heart Surgery;  Laterality: N/A;    Current Outpatient Medications  Medication Sig Dispense Refill   allopurinol (ZYLOPRIM) 100 MG tablet Take 100 mg by mouth daily as needed (gout).     aspirin EC 81 MG tablet Take 81 mg by mouth daily. Swallow whole.     Coenzyme Q10 (CO Q-10) 100 MG CAPS Take 1 capsule by mouth every morning.     Cyanocobalamin (VITAMIN B12 PO) Take 1  tablet by mouth daily.     loratadine (CLARITIN) 10 MG tablet Take 10 mg by mouth daily.     Magnesium 250 MG TABS Take 250 mg by mouth daily.     nitroGLYCERIN (NITROSTAT) 0.4 MG SL tablet Place 0.4 mg under the tongue every 5 (five) minutes x 3 doses as needed for chest pain (if no relief after 2nd dose, proceed to ED for an evaluation or call 911).     polyethylene glycol (MIRALAX / GLYCOLAX) 17 g packet Take 17 g by mouth daily as needed (constipation).     Polyethylene Glycol 400 (BLINK TEARS) 0.25 % SOLN Place 1-2 drops into both eyes 3 (three) times daily as needed (dry/irritated eyes.).     traMADol (ULTRAM) 50 MG tablet  Take 1 tablet (50 mg total) by mouth every 12 (twelve) hours as needed. 30 tablet 0   clopidogrel (PLAVIX) 75 MG tablet Take 1 tablet (75 mg total) by mouth daily. 90 tablet 2   No current facility-administered medications for this visit.   Allergies:  Crestor [rosuvastatin], Isosorbide mononitrate [isosorbide nitrate], and Vytorin [ezetimibe-simvastatin]   ROS: No palpitations, no syncope.  Physical Exam: VS:  BP (!) 160/80   Pulse 84   Ht '5\' 10"'$  (1.778 m)   Wt 190 lb (86.2 kg)   SpO2 98%   BMI 27.26 kg/m , BMI Body mass index is 27.26 kg/m.  Wt Readings from Last 3 Encounters:  04/28/21 190 lb (86.2 kg)  01/22/21 181 lb (82.1 kg)  01/05/21 187 lb 3.2 oz (84.9 kg)    General: Patient appears comfortable at rest. HEENT: Conjunctiva and lids normal, wearing a mask. Neck: Supple, no elevated JVP or carotid bruits, no thyromegaly. Lungs: Clear to auscultation, nonlabored breathing at rest. Cardiac: Regular rate and rhythm, no S3 or significant systolic murmur. Extremities: No pitting edema.  ECG:  An ECG dated 12/19/2020 was personally reviewed today and demonstrated:  Sinus rhythm with LVH.  Recent Labwork: 01/15/2021: ALT 15; AST 19; BUN 9; Creat 0.80; Hemoglobin 12.7; Magnesium 2.1; Platelets 147; Potassium 4.9; Sodium 135     Component Value Date/Time   CHOL 148 01/15/2021 0938   TRIG 155 (H) 01/15/2021 0938   HDL 50 01/15/2021 0938   CHOLHDL 3.0 01/15/2021 0938   VLDL 52 (H) 01/30/2013 1311   LDLCALC 75 01/15/2021 0938   LDLDIRECT 120 (H) 01/03/2013 0845    Other Studies Reviewed Today:  Cardiac catheterization 11/11/2020: Ost LAD to Prox LAD lesion is 90% stenosed. Prox LAD to Mid LAD prior stents are 100% stenosed. Mid RCA lesion is 25% stenosed. The left ventricular systolic function is normal. LV end diastolic pressure is normal. The left ventricular ejection fraction is 55-65% by visual estimate. There is no aortic valve stenosis.   Recurrent restenosis of  multiple LAD stents, 2013 ([facility]), 2016 [facility] hospital).   Plan for TCTS consult for possible CABG, LIMA to LAD.   Echocardiogram 11/26/2020:  1. Left ventricular ejection fraction, by estimation, is 60 to 65%. The  left ventricle has normal function. The left ventricle has no regional  wall motion abnormalities. There is moderate left ventricular hypertrophy.  Left ventricular diastolic  parameters are indeterminate. The average left ventricular global  longitudinal strain is -18.9 %. The global longitudinal strain is normal.   2. Right ventricular systolic function is normal. The right ventricular  size is normal. There is mildly elevated pulmonary artery systolic  pressure. The estimated right ventricular systolic pressure is Q000111Q mmHg.  3. The mitral valve is normal in structure. Trivial mitral valve  regurgitation. No evidence of mitral stenosis.   4. The aortic valve is tricuspid. Aortic valve regurgitation is not  visualized. No aortic stenosis is present.   5. The inferior vena cava is normal in size with greater than 50%  respiratory variability, suggesting right atrial pressure of 3 mmHg.   Assessment and Plan:  1.  CAD with restenosis of multiple LAD stent sites documented in March and now status post off-pump LIMA to LAD.  LVEF 60 to 65%.  He has recuperated fairly well.  I have encouraged him to get back to a walking plan, 20 to 30 minutes 3 to 4 days a week if possible.  He is on aspirin and Plavix, will stay on DAPT for 1 year.  We will keep him off beta-blocker for now, continue to track blood pressure at home.  2.  History of severe, mixed hyperlipidemia.  Intolerances now to Crestor, Lipitor, Zocor, and Zetia.  Check follow-up fasting lipid profile and determine next step.  Medication Adjustments/Labs and Tests Ordered: Current medicines are reviewed at length with the patient today.  Concerns regarding medicines are outlined above.   Tests Ordered: Orders Placed This  Encounter  Procedures   Lipid panel     Medication Changes: Meds ordered this encounter  Medications   clopidogrel (PLAVIX) 75 MG tablet    Sig: Take 1 tablet (75 mg total) by mouth daily.    Dispense:  90 tablet    Refill:  2     Disposition:  Follow up  6 months.  Signed, Satira Sark, MD, [facility] 04/28/2021 [facility] [place], [place], [place] [address] Phone: [phone]; Fax: [phone]

## 2021-04-28 ENCOUNTER — Encounter: Payer: Self-pay | Admitting: Cardiology

## 2021-04-28 ENCOUNTER — Ambulatory Visit: Payer: Medicare Other | Admitting: Cardiology

## 2021-04-28 VITALS — BP 160/80 | HR 84 | Ht 70.0 in | Wt 190.0 lb

## 2021-04-28 DIAGNOSIS — I25119 Atherosclerotic heart disease of native coronary artery with unspecified angina pectoris: Secondary | ICD-10-CM | POA: Diagnosis not present

## 2021-04-28 DIAGNOSIS — E782 Mixed hyperlipidemia: Secondary | ICD-10-CM | POA: Diagnosis not present

## 2021-04-28 MED ORDER — CLOPIDOGREL BISULFATE 75 MG PO TABS: 75.0000 mg | ORAL_TABLET | Freq: Every day | ORAL | 2 refills | Status: DC

## 2021-04-28 NOTE — Patient Instructions (Addendum)
Medication Instructions:  Your physician recommends that you continue on your current medications as directed. Please refer to the Current Medication list given to you today.  Labwork: Your physician recommends that you return for a FASTING lipid profile: 04/29/2021 at [facility]. Please do not eat or drink for at least 8 hours when you have this done. You may take your medications that morning with a sip of water.  Testing/Procedures: none  Follow-Up: Your physician recommends that you schedule a follow-up appointment in: 6 months  Any Other Special Instructions Will Be Listed Below (If Applicable).  If you need a refill on your cardiac medications before your next appointment, please call your pharmacy.

## 2021-04-29 DIAGNOSIS — E782 Mixed hyperlipidemia: Secondary | ICD-10-CM | POA: Diagnosis not present

## 2021-04-29 DIAGNOSIS — I25119 Atherosclerotic heart disease of native coronary artery with unspecified angina pectoris: Secondary | ICD-10-CM | POA: Diagnosis not present

## 2021-05-04 ENCOUNTER — Telehealth: Payer: Self-pay | Admitting: *Deleted

## 2021-05-04 NOTE — Telephone Encounter (Signed)
Patient's wife Olegario Shearer informed and verbalized understanding of plan. Reports patient recently restarted crestor 5 mg daily Wife will discuss with patient and call office back if he agrees to [facility] referral and starting jardiance 10 mg. Per Domenic Polite okay to start jardiance if patient agrees Copy sent to [facility]

## 2021-05-04 NOTE — Telephone Encounter (Signed)
-----   Message from Satira Sark, MD sent at 04/29/2021  6:02 PM EDT ----- Results reviewed.  His cholesterol has gone back up significantly, LDL now at 194 and total cholesterol 278.  As we discussed in clinic, I would go ahead and make a referral to the lipid clinic, this could either be in person or perhaps a virtual encounter.  I think he would be a good candidate for SGLT2 inhibitor, but he did want to discuss other potential options.  Unrelated to this, I think he would also be a good candidate for SGLT2 inhibitor in light of his ischemic heart disease.  Jardiance 10 mg daily would be considered - do not want to overburden him with medication discussions now, but this is something I also wanted to think about starting as well to reduce his risk.

## 2021-05-13 NOTE — Telephone Encounter (Signed)
Pt's wife is returning call - please call [phone]  They are on vacation right now if she doesn't answer.

## 2021-05-20 NOTE — Telephone Encounter (Signed)
Per wife, patient declines any medication changes at this time and will address at next visit Will try lifestyle modifications.

## 2021-06-22 ENCOUNTER — Ambulatory Visit (INDEPENDENT_AMBULATORY_CARE_PROVIDER_SITE_OTHER): Payer: Medicare Other | Admitting: Internal Medicine

## 2021-06-22 ENCOUNTER — Other Ambulatory Visit: Payer: Self-pay

## 2021-06-22 ENCOUNTER — Encounter (INDEPENDENT_AMBULATORY_CARE_PROVIDER_SITE_OTHER): Payer: Self-pay | Admitting: Internal Medicine

## 2021-06-22 ENCOUNTER — Ambulatory Visit (INDEPENDENT_AMBULATORY_CARE_PROVIDER_SITE_OTHER): Payer: No Typology Code available for payment source | Admitting: Internal Medicine

## 2021-06-22 VITALS — BP 121/73 | HR 73 | Temp 97.6°F | Ht 70.0 in | Wt 187.0 lb

## 2021-06-22 DIAGNOSIS — D649 Anemia, unspecified: Secondary | ICD-10-CM | POA: Diagnosis not present

## 2021-06-22 DIAGNOSIS — K2270 Barrett's esophagus without dysplasia: Secondary | ICD-10-CM

## 2021-06-22 DIAGNOSIS — K219 Gastro-esophageal reflux disease without esophagitis: Secondary | ICD-10-CM | POA: Diagnosis not present

## 2021-06-22 NOTE — Progress Notes (Signed)
Presenting complaint;  Follow for chronic GERD.  Database and subjective:  Patient is 70 year old Caucasian male who is here for scheduled visit accompanied by his wife.  He was last seen in March 2022. He has chronic GERD complicated by short segment Barrett's esophagus which was initially discovered in November 2015.  Biopsies were negative for dysplasia.  Patient has been tried on various PPIs and he also could not take famotidine.  These medications cause him to have intractable bloating and chest and abdominal pain.  He is therefore not on acid suppression. He also has a history of colonic adenomas per last colonoscopy and [facility] November 2018 did not reveal any polyps.  He was advised to have next examined November 2023.  Patient's states he feels fine he does not even remember the last time he had heartburn.  He denies dysphagia nausea vomiting hoarseness chronic cough or sore throat.  He is watching his diet.  He states he has not used nitroglycerin since his robotic coronary artery bypass graft in November 2022.  His appetite is good.  Bowels move daily.  He denies melena or rectal bleeding.  Patient states he eats his supper at 5 PM and generally goes to bed around 10 PM.  He has had end of the bed elevated. He has not smoked cigarettes in 22 years and drinks beer occasionally.  Current Medications: Outpatient Encounter Medications as of 06/22/2021  Medication Sig   allopurinol (ZYLOPRIM) 100 MG tablet Take 100 mg by mouth daily as needed (gout).   aspirin EC 81 MG tablet Take 81 mg by mouth daily. Swallow whole.   clopidogrel (PLAVIX) 75 MG tablet Take 1 tablet (75 mg total) by mouth daily.   Coenzyme Q10 (CO Q-10) 100 MG CAPS Take 1 capsule by mouth every morning.   Cyanocobalamin (VITAMIN B12 PO) Take 1 tablet by mouth daily.   Magnesium 250 MG TABS Take 250 mg by mouth daily.   metoprolol tartrate (LOPRESSOR) 25 MG tablet Take 12.5 mg by mouth 2 (two) times daily.    nitroGLYCERIN (NITROSTAT) 0.4 MG SL tablet Place 0.4 mg under the tongue every 5 (five) minutes x 3 doses as needed for chest pain (if no relief after 2nd dose, proceed to ED for an evaluation or call 911).   Omega-3 Fatty Acids (FISH OIL) 1000 MG CPDR Take by mouth. One daily   OVER THE COUNTER MEDICATION Super digest away. Takes one with every meal.   rosuvastatin (CRESTOR) 5 MG tablet Take 5 mg by mouth daily.   [DISCONTINUED] loratadine (CLARITIN) 10 MG tablet Take 10 mg by mouth daily.   [DISCONTINUED] polyethylene glycol (MIRALAX / GLYCOLAX) 17 g packet Take 17 g by mouth daily as needed (constipation).   [DISCONTINUED] Polyethylene Glycol 400 (BLINK TEARS) 0.25 % SOLN Place 1-2 drops into both eyes 3 (three) times daily as needed (dry/irritated eyes.).   [DISCONTINUED] traMADol (ULTRAM) 50 MG tablet Take 1 tablet (50 mg total) by mouth every 12 (twelve) hours as needed.   No facility-administered encounter medications on file as of 06/22/2021.     Objective: Blood pressure 121/73, pulse 73, temperature 97.6 F (36.4 C), temperature source Oral, height $RemoveBefo'5\' 10"'CHstJSdkCCa$  (1.778 m), weight 187 lb (84.8 kg). Patient is alert and in no acute distress. Conjunctiva is pink. Sclera is nonicteric Oropharyngeal mucosa is normal. No neck masses or thyromegaly noted. Cardiac exam with regular rhythm normal S1 and S2. No murmur or gallop noted. Lungs are clear to auscultation. Abdomen  is symmetrical soft and nontender with organomegaly or masses. No LE edema or clubbing noted.  Labs/studies Results:   CBC Latest Ref Rng & Units 01/15/2021 12/21/2020 12/20/2020  WBC 3.8 - 10.8 Thousand/uL 3.2(L) 4.8 -  Hemoglobin 13.2 - 17.1 g/dL 12.7(L) 8.9(L) 7.9(L)  Hematocrit 38.5 - 50.0 % 38.1(L) 25.2(L) 22.2(L)  Platelets 140 - 400 Thousand/uL 147 96(L) -    CMP Latest Ref Rng & Units 01/15/2021 12/20/2020 12/19/2020  Glucose 65 - 99 mg/dL 77 120(H) 142(H)  BUN 7 - 25 mg/dL $Remove'9 15 13  'omiPfZx$ Creatinine 0.70 - 1.25 mg/dL  0.80 0.98 1.12  Sodium 135 - 146 mmol/L 135 125(L) 130(L)  Potassium 3.5 - 5.3 mmol/L 4.9 4.0 4.2  Chloride 98 - 110 mmol/L 98 94(L) 100  CO2 20 - 32 mmol/L $RemoveB'26 23 23  'qdsgEsEv$ Calcium 8.6 - 10.3 mg/dL 10.2 8.4(L) 8.8(L)  Total Protein 6.1 - 8.1 g/dL 6.8 - -  Total Bilirubin 0.2 - 1.2 mg/dL 0.5 - -  Alkaline Phos 38 - 126 U/L - - -  AST 10 - 35 U/L 19 - -  ALT 9 - 46 U/L 15 - -    Hepatic Function Latest Ref Rng & Units 01/15/2021 12/16/2020 01/30/2013  Total Protein 6.1 - 8.1 g/dL 6.8 6.5 7.0  Albumin 3.5 - 5.0 g/dL - 4.3 4.6  AST 10 - 35 U/L $Remo'19 23 23  'xjtHi$ ALT 9 - 46 U/L $Remo'15 27 27  'tXARI$ Alk Phosphatase 38 - 126 U/L - 49 35(L)  Total Bilirubin 0.2 - 1.2 mg/dL 0.5 1.3(H) 0.6  Bilirubin, Direct 0.0 - 0.2 mg/dL 0.1 - 0.1     Assessment:  #1.  Chronic GERD complicated by short segment Barrett's esophagus.  SSBE was diagnosed on EGD of November 2015.  He had EGD in November 2018 through [place] [facility] and biopsy revealed Barrett's without dysplasia.  Patient has been intolerant of PPIs and H2B.  He will continue dietary measures as before and he can use OTC antacids or famotidine OTC 20 mg daily as needed.  #2.  Anemia and leukopenia as above patient's blood work from Jan 15, 2021 reveals mild anemia and leukopenia.  This abnormality may be related to CABG that he had in April 2022. Will do follow-up CBC to document that both of these abnormalities have resolved otherwise he will need further work-up.  #3.  History of colonic adenomas.  Colonoscopy in November 2018 was negative for adenomas.  His physician at [facility] recommended next exam in November 2023.  Plan:  Continue antireflux measures. Can use OTC antacids or famotidine OTC 20 mg daily as needed for breakthrough symptoms. CBC. Patient will call if he has heartburn more than 3 times a week or if he has dysphagia. Office visit in 1 year.

## 2021-06-22 NOTE — Patient Instructions (Signed)
Physician will call with result of blood test. Can take OTC antacids or Pepcid 20 mg on as needed basis if needed

## 2021-06-23 ENCOUNTER — Ambulatory Visit: Payer: No Typology Code available for payment source | Admitting: Physician Assistant

## 2021-06-28 DIAGNOSIS — D649 Anemia, unspecified: Secondary | ICD-10-CM | POA: Diagnosis not present

## 2021-06-28 LAB — CBC WITH DIFFERENTIAL/PLATELET
Absolute Monocytes: 371 cells/uL (ref 200–950)
Basophils Absolute: 29 cells/uL (ref 0–200)
Basophils Relative: 0.9 %
Eosinophils Absolute: 282 cells/uL (ref 15–500)
Eosinophils Relative: 8.8 %
HCT: 43.4 % (ref 38.5–50.0)
Hemoglobin: 14.8 g/dL (ref 13.2–17.1)
Lymphs Abs: 691 cells/uL — ABNORMAL LOW (ref 850–3900)
MCH: 33.6 pg — ABNORMAL HIGH (ref 27.0–33.0)
MCHC: 34.1 g/dL (ref 32.0–36.0)
MCV: 98.4 fL (ref 80.0–100.0)
MPV: 9.9 fL (ref 7.5–12.5)
Monocytes Relative: 11.6 %
Neutro Abs: 1827 cells/uL (ref 1500–7800)
Neutrophils Relative %: 57.1 %
Platelets: 102 10*3/uL — ABNORMAL LOW (ref 140–400)
RBC: 4.41 10*6/uL (ref 4.20–5.80)
RDW: 12.4 % (ref 11.0–15.0)
Total Lymphocyte: 21.6 %
WBC: 3.2 10*3/uL — ABNORMAL LOW (ref 3.8–10.8)

## 2021-06-30 ENCOUNTER — Ambulatory Visit: Payer: Medicare Other | Admitting: Physician Assistant

## 2021-06-30 ENCOUNTER — Encounter: Payer: Self-pay | Admitting: Physician Assistant

## 2021-06-30 ENCOUNTER — Other Ambulatory Visit: Payer: Self-pay

## 2021-06-30 DIAGNOSIS — L570 Actinic keratosis: Secondary | ICD-10-CM

## 2021-06-30 DIAGNOSIS — C44319 Basal cell carcinoma of skin of other parts of face: Secondary | ICD-10-CM | POA: Diagnosis not present

## 2021-06-30 DIAGNOSIS — D0430 Carcinoma in situ of skin of unspecified part of face: Secondary | ICD-10-CM

## 2021-06-30 DIAGNOSIS — Z85828 Personal history of other malignant neoplasm of skin: Secondary | ICD-10-CM | POA: Diagnosis not present

## 2021-06-30 DIAGNOSIS — Z1283 Encounter for screening for malignant neoplasm of skin: Secondary | ICD-10-CM

## 2021-06-30 DIAGNOSIS — D0439 Carcinoma in situ of skin of other parts of face: Secondary | ICD-10-CM | POA: Diagnosis not present

## 2021-06-30 DIAGNOSIS — C44310 Basal cell carcinoma of skin of unspecified parts of face: Secondary | ICD-10-CM

## 2021-06-30 NOTE — Patient Instructions (Signed)

## 2021-06-30 NOTE — Progress Notes (Signed)
New Patient   Subjective  [name] is a 70 y.o. male who presents for the following: Annual Exam (Here for annual skin exam. Concerns some dry patches on face. Also has a lesion on his forehead that bleeds. History of non mole skin cancers. ).   The following portions of the chart were reviewed this encounter and updated as appropriate:  Tobacco  Allergies  Meds  Problems  Med Hx  Surg Hx  Fam Hx      Objective  Well appearing patient in no apparent distress; mood and affect are within normal limits.  All skin waist up examined.  Left Parotid Area (5), Right Buccal Cheek (5) Erythematous patches with gritty scale.  Right Temple Hyperkeratotic scale with pink base        Mid Tip of Nose Hyperkeratotic scale with pink base        Left Forehead Hyperkeratotic scale with pink base and dark center.        Assessment & Plan  AK (actinic keratosis) (10) Left Parotid Area (5); Right Buccal Cheek (5)  Destruction of lesion - Left Parotid Area, Right Buccal Cheek Complexity: simple   Destruction method: cryotherapy   Informed consent: discussed and consent obtained   Timeout:  patient name, date of birth, surgical site, and procedure verified Lesion destroyed using liquid nitrogen: Yes   Cryotherapy cycles:  3 Outcome: patient tolerated procedure well with no complications    Neoplasm of uncertain behavior of skin (3) Right Temple  Skin / nail biopsy Type of biopsy: tangential   Informed consent: discussed and consent obtained   Timeout: patient name, date of birth, surgical site, and procedure verified   Procedure prep:  Patient was prepped and draped in usual sterile fashion (Non sterile) Prep type:  Chlorhexidine Anesthesia: the lesion was anesthetized in a standard fashion   Anesthetic:  1% lidocaine w/ epinephrine 1-100,000 local infiltration Instrument used: flexible razor blade   Outcome: patient tolerated procedure well    Post-procedure details: wound care instructions given    Destruction of lesion Complexity: simple   Destruction method: electrodesiccation and curettage   Informed consent: discussed and consent obtained   Timeout:  patient name, date of birth, surgical site, and procedure verified Anesthesia: the lesion was anesthetized in a standard fashion   Anesthetic:  1% lidocaine w/ epinephrine 1-100,000 local infiltration Curettage performed in three different directions: Yes   Electrodesiccation performed over the curetted area: Yes   Curettage cycles:  1 Margin per side (cm):  0.1 Final wound size (cm):  1.1 Hemostasis achieved with:  aluminum chloride Outcome: patient tolerated procedure well with no complications   Post-procedure details: wound care instructions given    Specimen 1 - Surgical pathology Differential Diagnosis: bcc vs scc -txpbx  Check Margins: No  Mid Tip of Nose  Skin / nail biopsy Type of biopsy: tangential   Informed consent: discussed and consent obtained   Timeout: patient name, date of birth, surgical site, and procedure verified   Procedure prep:  Patient was prepped and draped in usual sterile fashion (Non sterile) Prep type:  Chlorhexidine Anesthesia: the lesion was anesthetized in a standard fashion   Anesthetic:  1% lidocaine w/ epinephrine 1-100,000 local infiltration Instrument used: flexible razor blade   Outcome: patient tolerated procedure well   Post-procedure details: wound care instructions given    Destruction of lesion Complexity: simple   Destruction method: electrodesiccation and curettage   Informed consent: discussed and consent obtained  Timeout:  patient name, date of birth, surgical site, and procedure verified Anesthesia: the lesion was anesthetized in a standard fashion   Anesthetic:  1% lidocaine w/ epinephrine 1-100,000 local infiltration Curettage performed in three different directions: Yes   Electrodesiccation performed  over the curetted area: Yes   Curettage cycles:  1 Margin per side (cm):  0.1 Final wound size (cm):  0.5 Hemostasis achieved with:  aluminum chloride Outcome: patient tolerated procedure well with no complications   Post-procedure details: wound care instructions given    Specimen 2 - Surgical pathology Differential Diagnosis: bcc vs scc -txpbx  Check Margins: No  Left Forehead  Skin / nail biopsy Type of biopsy: tangential   Informed consent: discussed and consent obtained   Timeout: patient name, date of birth, surgical site, and procedure verified   Procedure prep:  Patient was prepped and draped in usual sterile fashion (Non sterile) Prep type:  Chlorhexidine Anesthesia: the lesion was anesthetized in a standard fashion   Anesthetic:  1% lidocaine w/ epinephrine 1-100,000 local infiltration Instrument used: flexible razor blade   Outcome: patient tolerated procedure well   Post-procedure details: wound care instructions given    Destruction of lesion Complexity: simple   Destruction method: electrodesiccation and curettage   Informed consent: discussed and consent obtained   Timeout:  patient name, date of birth, surgical site, and procedure verified Anesthesia: the lesion was anesthetized in a standard fashion   Anesthetic:  1% lidocaine w/ epinephrine 1-100,000 local infiltration Curettage performed in three different directions: Yes   Electrodesiccation performed over the curetted area: Yes   Curettage cycles:  1 Margin per side (cm):  0.1 Final wound size (cm):  1.1 Hemostasis achieved with:  aluminum chloride Outcome: patient tolerated procedure well with no complications   Post-procedure details: wound care instructions given    Specimen 3 - Surgical pathology Differential Diagnosis: bcc vs scc -txpbx  Check Margins: No     I, Domingos Riggi, PA-C, have reviewed all documentation's for this visit.  The documentation on 06/30/21 for the exam, diagnosis,  procedures and orders are all accurate and complete.

## 2021-07-15 ENCOUNTER — Telehealth: Payer: Self-pay | Admitting: Cardiology

## 2021-07-15 ENCOUNTER — Other Ambulatory Visit: Payer: Self-pay | Admitting: Physician Assistant

## 2021-07-15 MED ORDER — METOPROLOL TARTRATE 25 MG PO TABS: 12.5000 mg | ORAL_TABLET | Freq: Two times a day (BID) | ORAL | 3 refills | Status: DC

## 2021-07-15 NOTE — Telephone Encounter (Signed)
  *  STAT* If patient is at the pharmacy, call can be transferred to refill team.   1. Which medications need to be refilled? (please list name of each medication and dose if known) metoprolol tartrate (LOPRESSOR) 25 MG tablet  2. Which pharmacy/location (including street and city if local pharmacy) is medication to be sent to? [place], [place]  3. Do they need a 30 day or 90 day supply? 90 days

## 2021-08-10 NOTE — Addendum Note (Signed)
Addended by: Robyne Askew R on: 08/10/2021 03:51 PM   Modules accepted: Level of Service

## 2021-08-16 ENCOUNTER — Telehealth: Payer: Self-pay | Admitting: Cardiology

## 2021-08-16 MED ORDER — CLOPIDOGREL BISULFATE 75 MG PO TABS: 75.0000 mg | ORAL_TABLET | Freq: Every day | ORAL | 3 refills | Status: DC

## 2021-08-16 NOTE — Telephone Encounter (Signed)
done

## 2021-08-16 NOTE — Telephone Encounter (Signed)
*  STAT* If patient is at the pharmacy, call can be transferred to refill team.   1. Which medications need to be refilled? (please list name of each medication and dose if known) clopidogrel (PLAVIX) 75 MG tablet  2. Which pharmacy/location (including street and city if local pharmacy) is medication to be sent to? [place], [place]  3. Do they need a 30 day or 90 day supply?  90 day supply    Only has 4 tablets left. Has not ordered through [facility] due to this.

## 2021-08-30 ENCOUNTER — Telehealth: Payer: Self-pay | Admitting: Nurse Practitioner

## 2021-08-30 ENCOUNTER — Other Ambulatory Visit: Payer: Self-pay | Admitting: Nurse Practitioner

## 2021-08-30 MED ORDER — MOLNUPIRAVIR EUA 200MG CAPSULE: 4.0000 | ORAL_CAPSULE | Freq: Two times a day (BID) | ORAL | 0 refills | Status: AC

## 2021-08-30 NOTE — Telephone Encounter (Signed)
° °  Pts wife called this AM to report that pt developed chest congestion, cough, and sore throat on 12/25.  He had some GI upset overnight and this AM, he has malaise and myalgias.  No resp distress.  Pts wife asked if pt would possibly be a candidate for paxlovid.  I reviewed his medicine list in detail.  He is currently on plavix and rosuvastatin, which are contraindicated when using paxlovid, and colchicine and metoprolol, which require caution.  We agreed that using paxlovid would not be ideal.  He is however a candidate for molnupiravir.  I discussed the indications, side effects, and relevant clinical trial data for molnupiravir with pt and wife.  They would like to me send in Rx to [facility] in [place].  I have sent in a rx for molnupiravir 200mg  4 tabs BID x 5 days, #40 w/ zero refills.  I advised that if pts symptoms worsen/he develops respiratory distress, that he should have a low threshold to seek medical attention.  Caller verbalized understanding and was grateful for the call back.  Murray Hodgkins, NP 08/30/2021, 11:11 AM

## 2021-08-31 MED ORDER — BENZONATATE 100 MG PO CAPS
100.0000 mg | ORAL_CAPSULE | Freq: Three times a day (TID) | ORAL | 1 refills | Status: DC | PRN
Start: 2021-08-31 — End: 2022-11-21

## 2021-08-31 NOTE — Addendum Note (Signed)
Addended by: Berlinda Last on: 08/31/2021 05:02 PM   Modules accepted: Orders

## 2021-08-31 NOTE — Telephone Encounter (Signed)
Dr. Domenic Polite out of office this week - typically he would defer this to pcp.   Please advise.

## 2021-08-31 NOTE — Telephone Encounter (Signed)
Patient calling back to see if our office can called him in cough mediation due to his covid. Please advise

## 2021-08-31 NOTE — Telephone Encounter (Signed)
Patient's wife calling back. She states he does not have a PCP and [facility]. She states his cough is really bad and over the counter cough medicine is not helping.

## 2021-08-31 NOTE — Telephone Encounter (Signed)
Rec he call PCP for ongoing issues w/ covid.

## 2021-08-31 NOTE — Telephone Encounter (Signed)
Pls send in Benzonatate 100 mg PO TID PRN, # 30, one refill.  If cough doesn't improve w/ 1 tab TID, he may increase to 2 tabs TID PRN.

## 2021-10-18 ENCOUNTER — Telehealth: Payer: Self-pay | Admitting: Cardiology

## 2021-10-18 NOTE — Telephone Encounter (Signed)
I spoke with wife and relayed Dr.McDowells message to hold plavix 5 days before dental extractions.

## 2021-10-18 NOTE — Telephone Encounter (Signed)
I will forward to [place] for review.

## 2021-10-18 NOTE — Telephone Encounter (Signed)
Pt c/o medication issue:  1. Name of Medication:  clopidogrel (PLAVIX) 75 MG tablet aspirin EC 81 MG tablet 2. How are you currently taking this medication (dosage and times per day)? 1 tablet daily  3. Are you having a reaction (difficulty breathing--STAT)? no  4. What is your medication issue? Patient's wife calling to see if the patient needs to stop his blood thinner prior to his tooth extractions 3/1. She says the dentist office is not requesting clearance. She says they are taking 3 teeth, 2 in the back and the bridge.

## 2021-11-01 ENCOUNTER — Other Ambulatory Visit: Payer: Self-pay

## 2021-11-01 ENCOUNTER — Encounter: Payer: Self-pay | Admitting: Cardiology

## 2021-11-01 ENCOUNTER — Ambulatory Visit: Payer: Medicare Other | Admitting: Cardiology

## 2021-11-01 VITALS — BP 138/76 | HR 77 | Ht 70.0 in | Wt 194.2 lb

## 2021-11-01 DIAGNOSIS — I25119 Atherosclerotic heart disease of native coronary artery with unspecified angina pectoris: Secondary | ICD-10-CM | POA: Diagnosis not present

## 2021-11-01 DIAGNOSIS — E782 Mixed hyperlipidemia: Secondary | ICD-10-CM

## 2021-11-01 MED ORDER — FISH OIL 1000 MG PO CPDR
1.0000 | DELAYED_RELEASE_CAPSULE | Freq: Two times a day (BID) | ORAL | Status: DC
Start: 2021-11-01 — End: 2022-11-14

## 2021-11-01 MED ORDER — METOPROLOL SUCCINATE ER 25 MG PO TB24: 25.0000 mg | ORAL_TABLET | Freq: Every day | ORAL | 3 refills | Status: DC

## 2021-11-01 MED ORDER — ROSUVASTATIN CALCIUM 10 MG PO TABS: 10.0000 mg | ORAL_TABLET | Freq: Every day | ORAL | 3 refills | Status: DC

## 2021-11-01 MED ORDER — CLOPIDOGREL BISULFATE 75 MG PO TABS: 75.0000 mg | ORAL_TABLET | Freq: Every day | ORAL | Status: AC

## 2021-11-01 NOTE — Patient Instructions (Signed)
Medication Instructions:  Increase Crestor to 10mg  daily Stop Lopressor (Metoprolol Tart) Begin Toprol XL 25mg  daily Increase Omega 3 to 1,000mg  twice a day   May stop Plavix at the end of April 2023. Continue all other medications.     Labwork: none  Testing/Procedures: none  Follow-Up: 6 months   Any Other Special Instructions Will Be Listed Below (If Applicable).   If you need a refill on your cardiac medications before your next appointment, please call your pharmacy.

## 2021-11-01 NOTE — Progress Notes (Signed)
Cardiology Office Note  Date: 11/01/2021   ID: Corderro, Koloski 03-07-1951, MRN [id number]  PCP:  Myrla Halsted, MD  Cardiologist:  Rozann Lesches, MD Electrophysiologist:  None   Chief Complaint  Patient presents with   Cardiac follow-up    History of Present Illness: [name] is a 71 y.o. male last seen in August 2022.  He is here today with his wife for a follow-up visit.  He does not report any angina symptoms.  Has not been as active but hopes to start walking more as the weather improves.  He has a history of statin intolerance including Zocor and Lipitor.  Also intolerant of Zetia.  He has had trouble with recurrent myalgias. Follow-up lab work in August 2022 showed LDL up to 194 in the absence of treatment.  We have had him on Crestor 5 mg daily, he tells me that he is able to stay on this medicine so far.  Follow-up lipid profile showed LDL down to 106.  He is willing to try to increase the dose to 10 mg daily.  I reviewed the remainder of his medications which are noted below.  We will plan to switch him from Lopressor to Toprol-XL which he had been taking previously.  Also increasing his omega-3 supplements to 1000 mg twice daily.  His last triglyceride level was 308.  Past Medical History:  Diagnosis Date   Barrett's esophagus    Coronary artery disease    ACS in 02/2006: DES to subtotal LAD; repeat cath in 06/2006: Residual 80% small D1 and 70% D2-medical therapy advised; off-pump LIMA to LAD April 2022   GERD (gastroesophageal reflux disease)    Gout    History of hiatal hernia    History of pneumonia    Hyperlipidemia    Hypertension    Low back pain     Past Surgical History:  Procedure Laterality Date   CARDIAC CATHETERIZATION     COLONOSCOPY     CORONARY ANGIOPLASTY WITH STENT PLACEMENT  02/17/2012    proximal LAD   ESOPHAGOGASTRODUODENOSCOPY N/A 07/24/2014   Procedure: ESOPHAGOGASTRODUODENOSCOPY (EGD);  Surgeon: Rogene Houston, MD;   [facility];  Service: Endoscopy;  Laterality: N/A;  210   LEFT HEART CATH AND CORONARY ANGIOGRAPHY N/A 11/11/2020   Procedure: LEFT HEART CATH AND CORONARY ANGIOGRAPHY;  Surgeon: Jettie Booze, MD;  Location: [facility];  Service: Cardiovascular;  Laterality: N/A;   MALONEY DILATION N/A 07/24/2014   Procedure: Venia Minks DILATION;  Surgeon: Rogene Houston, MD;  [facility];  Service: Endoscopy;  Laterality: N/A;   PERCUTANEOUS CORONARY STENT INTERVENTION (PCI-S) N/A 02/17/2012   Procedure: PERCUTANEOUS CORONARY STENT INTERVENTION (PCI-S);  Surgeon: Burnell Blanks, MD;  Location: [facility] CATH LAB;  Service: Cardiovascular;  Laterality: N/A;   TEE WITHOUT CARDIOVERSION N/A 12/18/2020   Procedure: TRANSESOPHAGEAL ECHOCARDIOGRAM (TEE);  Surgeon: Lajuana Matte, MD;  Location: [facility];  Service: Open Heart Surgery;  Laterality: N/A;    Current Outpatient Medications  Medication Sig Dispense Refill   allopurinol (ZYLOPRIM) 100 MG tablet Take 100 mg by mouth daily as needed (gout).     aspirin EC 81 MG tablet Take 81 mg by mouth daily. Swallow whole.     benzonatate (TESSALON PERLES) 100 MG capsule Take 1 capsule (100 mg total) by mouth 3 (three) times daily as needed for cough. 30 capsule 1   cetirizine (ZYRTEC) 10 MG tablet Take 10 mg by mouth  daily.     Coenzyme Q10 (CO Q-10) 100 MG CAPS Take 1 capsule by mouth every morning.     colchicine 0.6 MG tablet Take 0.6 mg by mouth as needed.     Cyanocobalamin (VITAMIN B12 PO) Take 1 tablet by mouth daily.     Magnesium 250 MG TABS Take 250 mg by mouth daily.     metoprolol succinate (TOPROL XL) 25 MG 24 hr tablet Take 1 tablet (25 mg total) by mouth daily. 90 tablet 3   nitroGLYCERIN (NITROSTAT) 0.4 MG SL tablet Place 0.4 mg under the tongue every 5 (five) minutes x 3 doses as needed for chest pain (if no relief after 2nd dose, proceed to ED for an evaluation or call 911).     Omega-3 Fatty Acids (FISH OIL) 1000  MG CPDR Take 1 capsule by mouth 2 (two) times daily. One daily     OVER THE COUNTER MEDICATION Super digest away. Takes one with every meal.     sildenafil (VIAGRA) 100 MG tablet Take 100 mg by mouth daily as needed for erectile dysfunction.     clopidogrel (PLAVIX) 75 MG tablet Take 1 tablet (75 mg total) by mouth daily. MAY STOP THE END OF April     rosuvastatin (CRESTOR) 10 MG tablet Take 1 tablet (10 mg total) by mouth daily. 90 tablet 3   No current facility-administered medications for this visit.   Allergies:  Crestor [rosuvastatin], Isosorbide mononitrate [isosorbide nitrate], and Vytorin [ezetimibe-simvastatin]   ROS: No palpitations or syncope.  Physical Exam: VS:  BP 138/76    Pulse 77    Ht 5\' 10"  (1.778 m)    Wt 194 lb 3.2 oz (88.1 kg)    SpO2 96%    BMI 27.86 kg/m , BMI Body mass index is 27.86 kg/m.  Wt Readings from Last 3 Encounters:  11/01/21 194 lb 3.2 oz (88.1 kg)  06/22/21 187 lb (84.8 kg)  04/28/21 190 lb (86.2 kg)    General: Patient appears comfortable at rest. HEENT: Conjunctiva and lids normal, wearing a mask. Neck: Supple, no elevated JVP or carotid bruits, no thyromegaly. Lungs: Clear to auscultation, nonlabored breathing at rest. Cardiac: Regular rate and rhythm, no S3 or significant systolic murmur, no pericardial rub. Extremities: No pitting edema.  ECG:  An ECG dated 12/19/2020 was personally reviewed today and demonstrated:  Sinus rhythm with increased voltage.  Recent Labwork: 01/15/2021: ALT 15; AST 19; BUN 9; Creat 0.80; Magnesium 2.1; Potassium 4.9; Sodium 135 06/28/2021: Hemoglobin 14.8; Platelets 102     Component Value Date/Time   CHOL 148 01/15/2021 0938   TRIG 155 (H) 01/15/2021 0938   HDL 50 01/15/2021 0938   CHOLHDL 3.0 01/15/2021 0938   VLDL 52 (H) 01/30/2013 1311   LDLCALC 75 01/15/2021 0938   LDLDIRECT 120 (H) 01/03/2013 0845  August 2022: Cholesterol 278, HDL 60, LDL 194, triglycerides 118 February 2023: BUN 16, creatinine  0.85, potassium 4.5, cholesterol 208, triglycerides 308, HDL 40, LDL 106, TSH 1.99, hemoglobin 14.2, platelets 101  Other Studies Reviewed Today:  Cardiac catheterization 11/11/2020: Ost LAD to Prox LAD lesion is 90% stenosed. Prox LAD to Mid LAD prior stents are 100% stenosed. Mid RCA lesion is 25% stenosed. The left ventricular systolic function is normal. LV end diastolic pressure is normal. The left ventricular ejection fraction is 55-65% by visual estimate. There is no aortic valve stenosis.   Recurrent restenosis of multiple LAD stents, 2013 ([facility]), 2016 [facility] hospital).   Plan for TCTS  consult for possible CABG, LIMA to LAD.    Echocardiogram 11/26/2020:  1. Left ventricular ejection fraction, by estimation, is 60 to 65%. The  left ventricle has normal function. The left ventricle has no regional  wall motion abnormalities. There is moderate left ventricular hypertrophy.  Left ventricular diastolic  parameters are indeterminate. The average left ventricular global  longitudinal strain is -18.9 %. The global longitudinal strain is normal.   2. Right ventricular systolic function is normal. The right ventricular  size is normal. There is mildly elevated pulmonary artery systolic  pressure. The estimated right ventricular systolic pressure is 00.8 mmHg.   3. The mitral valve is normal in structure. Trivial mitral valve  regurgitation. No evidence of mitral stenosis.   4. The aortic valve is tricuspid. Aortic valve regurgitation is not  visualized. No aortic stenosis is present.   5. The inferior vena cava is normal in size with greater than 50%  respiratory variability, suggesting right atrial pressure of 3 mmHg.   Assessment and Plan:  1.  CAD status post off-pump LIMA to the LAD in March 2022.  He is doing well without active angina at this time.  Recommended increasing his walking regimen.  He will come off Plavix at the end of April.  Otherwise continue aspirin, Crestor, and as  needed nitroglycerin.  Changing from Lopressor to Toprol-XL 25 mg daily.  2.  Severe hyperlipidemia with history of statin intolerance.  He is doing reasonably well on Crestor 5 mg daily, we will attempt an increase to 10 mg daily.  Recent LDL 106 down from 194.  I did talk with him about the possibility of Leqvio.  Medication Adjustments/Labs and Tests Ordered: Current medicines are reviewed at length with the patient today.  Concerns regarding medicines are outlined above.   Tests Ordered: No orders of the defined types were placed in this encounter.   Medication Changes: Meds ordered this encounter  Medications   rosuvastatin (CRESTOR) 10 MG tablet    Sig: Take 1 tablet (10 mg total) by mouth daily.    Dispense:  90 tablet    Refill:  3    Dose increased 11/01/2021   metoprolol succinate (TOPROL XL) 25 MG 24 hr tablet    Sig: Take 1 tablet (25 mg total) by mouth daily.    Dispense:  90 tablet    Refill:  3    Stopping Lopressor, med changed 11/01/2021   clopidogrel (PLAVIX) 75 MG tablet    Sig: Take 1 tablet (75 mg total) by mouth daily. MAY STOP THE END OF April    Disposition:  Follow up  6 months.  Signed, Satira Sark, MD, [facility] 11/01/2021 11:38 AM    [place] at [place], [place], [place] [address] Phone: [phone]; Fax: [phone]

## 2022-02-01 IMAGING — DX DG CHEST 1V PORT
1 series · 1 of 1 positions shown · non-contrast
Comparison: [DATE] [DATE], [DATE] [DATE] a.m.

CLINICAL DATA: Shortness of breath and chest pain.

EXAM:
PORTABLE CHEST 1 VIEW

[chest ap]
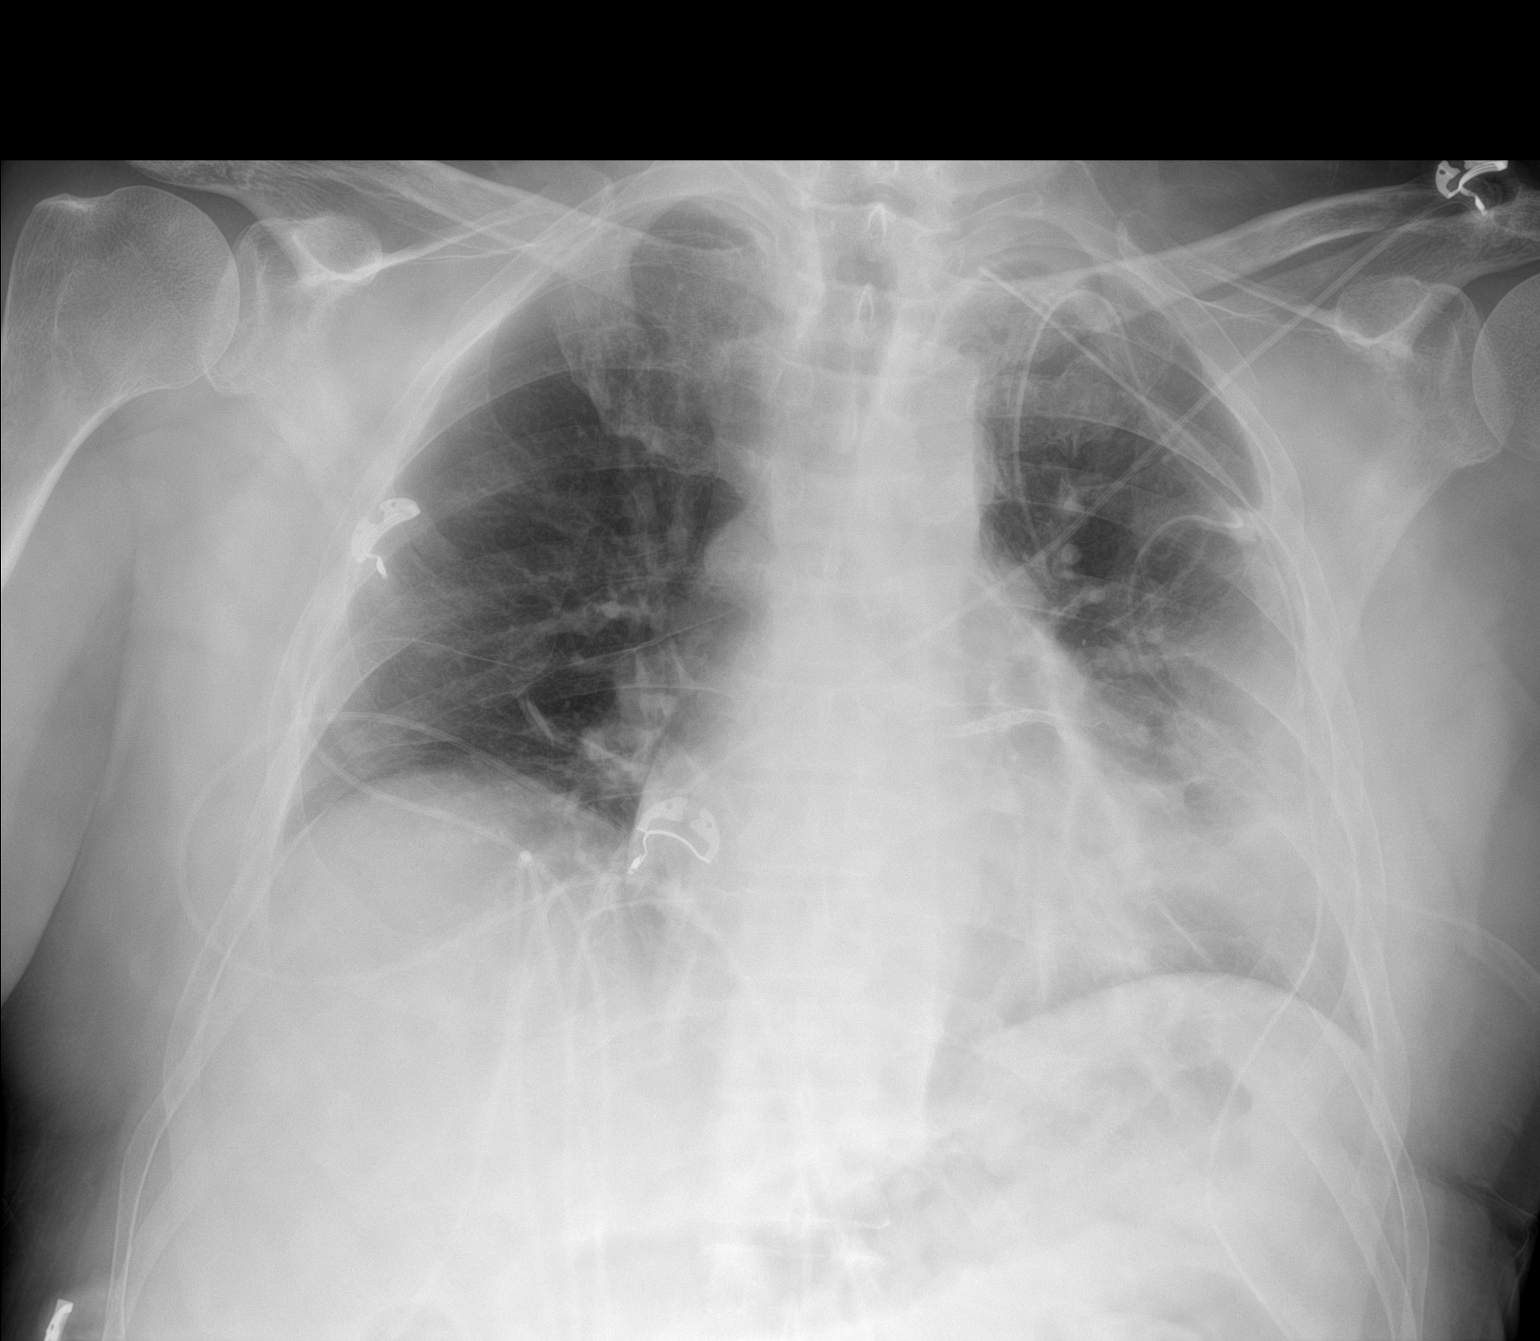

[1 of 1 positions shown; findings below may reference images not displayed]

FINDINGS: Left-sided chest tubes are identified unchanged compared prior exam.
No definite pleural line is identified in the left lung. Patchy
consolidation of left lung base is unchanged. Minimal atelectasis of
right lung base is noted. The mediastinal contour and cardiac
silhouette are stable. There is been interval removal previously
noted right jugular vascular line.
IMPRESSION: Left-sided chest tubes are identified unchanged compared prior exam.
No definite pleural line is identified in the left lung to suggest
pneumothorax.

Interval removal of right jugular vascular line.

## 2022-05-10 NOTE — Progress Notes (Unsigned)
Cardiology Office Note  Date: 05/11/2022   ID: Juan, Hobbs 1950/10/01, MRN [id number]  PCP:  Myrla Halsted, MD  Cardiologist:  Rozann Lesches, MD Electrophysiologist:  None   Chief Complaint  Patient presents with   Cardiac follow-up    History of Present Illness: [name] is a 71 y.o. male last seen in February.  He is here for a routine visit.  Reports no angina or nitroglycerin use in the interim.  He did tolerate increasing Crestor to 10 mg daily and we will plan a follow-up fast lipid profile.  I personally reviewed the remainder of his medications which are noted below.  ECG today shows sinus rhythm with LVH and repolarization changes.  He continues to follow with the [facility] for primary care, would like to find a local provider as well.  Past Medical History:  Diagnosis Date   Barrett's esophagus    Coronary artery disease    ACS in 02/2006: DES to subtotal LAD; repeat cath in 06/2006: Residual 80% small D1 and 70% D2-medical therapy advised; off-pump LIMA to LAD April 2022   GERD (gastroesophageal reflux disease)    Gout    History of hiatal hernia    History of pneumonia    Hyperlipidemia    Hypertension    Low back pain     Past Surgical History:  Procedure Laterality Date   CARDIAC CATHETERIZATION     COLONOSCOPY     CORONARY ANGIOPLASTY WITH STENT PLACEMENT  02/17/2012    proximal LAD   ESOPHAGOGASTRODUODENOSCOPY N/A 07/24/2014   Procedure: ESOPHAGOGASTRODUODENOSCOPY (EGD);  Surgeon: Rogene Houston, MD;  [facility];  Service: Endoscopy;  Laterality: N/A;  210   LEFT HEART CATH AND CORONARY ANGIOGRAPHY N/A 11/11/2020   Procedure: LEFT HEART CATH AND CORONARY ANGIOGRAPHY;  Surgeon: Jettie Booze, MD;  Location: [facility];  Service: Cardiovascular;  Laterality: N/A;   MALONEY DILATION N/A 07/24/2014   Procedure: Venia Minks DILATION;  Surgeon: Rogene Houston, MD;  [facility];  Service: Endoscopy;   Laterality: N/A;   PERCUTANEOUS CORONARY STENT INTERVENTION (PCI-S) N/A 02/17/2012   Procedure: PERCUTANEOUS CORONARY STENT INTERVENTION (PCI-S);  Surgeon: Burnell Blanks, MD;  Location: [facility] CATH LAB;  Service: Cardiovascular;  Laterality: N/A;   TEE WITHOUT CARDIOVERSION N/A 12/18/2020   Procedure: TRANSESOPHAGEAL ECHOCARDIOGRAM (TEE);  Surgeon: Lajuana Matte, MD;  Location: [facility];  Service: Open Heart Surgery;  Laterality: N/A;    Current Outpatient Medications  Medication Sig Dispense Refill   allopurinol (ZYLOPRIM) 100 MG tablet Take 100 mg by mouth daily as needed (gout).     aspirin EC 81 MG tablet Take 81 mg by mouth daily. Swallow whole.     cetirizine (ZYRTEC) 10 MG tablet Take 10 mg by mouth daily.     Coenzyme Q10 (CO Q-10) 100 MG CAPS Take 1 capsule by mouth every morning.     colchicine 0.6 MG tablet Take 0.6 mg by mouth daily.     Cyanocobalamin (VITAMIN B12 PO) Take 1 tablet by mouth daily.     Magnesium 250 MG TABS Take 250 mg by mouth daily.     metoprolol succinate (TOPROL XL) 25 MG 24 hr tablet Take 1 tablet (25 mg total) by mouth daily. 90 tablet 3   nitroGLYCERIN (NITROSTAT) 0.4 MG SL tablet Place 0.4 mg under the tongue every 5 (five) minutes x 3 doses as needed for chest pain (if no relief after 2nd dose,  proceed to ED for an evaluation or call 911).     Omega-3 Fatty Acids (FISH OIL) 1000 MG CPDR Take 1 capsule by mouth 2 (two) times daily.     OVER THE COUNTER MEDICATION Super digest away. Takes one with every meal.     rosuvastatin (CRESTOR) 10 MG tablet Take 1 tablet (10 mg total) by mouth daily. 90 tablet 3   sildenafil (VIAGRA) 100 MG tablet Take 100 mg by mouth daily as needed for erectile dysfunction.     benzonatate (TESSALON PERLES) 100 MG capsule Take 1 capsule (100 mg total) by mouth 3 (three) times daily as needed for cough. (Patient not taking: Reported on 05/11/2022) 30 capsule 1   No current facility-administered medications for this visit.    Allergies:  Crestor [rosuvastatin], Isosorbide mononitrate [isosorbide nitrate], and Vytorin [ezetimibe-simvastatin]   ROS: No palpitations or syncope.  Physical Exam: VS:  BP (!) 148/70 (BP Location: Left Arm, Patient Position: Sitting, Cuff Size: Normal)   Pulse 80   Ht '5\' 10"'$  (1.778 m)   Wt 191 lb 12.8 oz (87 kg)   SpO2 96%   BMI 27.52 kg/m , BMI Body mass index is 27.52 kg/m.  Wt Readings from Last 3 Encounters:  05/11/22 191 lb 12.8 oz (87 kg)  11/01/21 194 lb 3.2 oz (88.1 kg)  06/22/21 187 lb (84.8 kg)    General: Patient appears comfortable at rest. HEENT: Conjunctiva and lids normal. Neck: Supple, no elevated JVP or carotid bruits, no thyromegaly. Lungs: Clear to auscultation, nonlabored breathing at rest. Cardiac: Regular rate and rhythm, no S3 or significant systolic murmur, no pericardial rub. Extremities: No pitting edema.  ECG:  An ECG dated 12/19/2020 was personally reviewed today and demonstrated:  Sinus rhythm with increased voltage.  Recent Labwork: 06/28/2021: Hemoglobin 14.8; Platelets 102     Component Value Date/Time   CHOL 148 01/15/2021 0938   TRIG 155 (H) 01/15/2021 0938   HDL 50 01/15/2021 0938   CHOLHDL 3.0 01/15/2021 0938   VLDL 52 (H) 01/30/2013 1311   LDLCALC 75 01/15/2021 0938   LDLDIRECT 120 (H) 01/03/2013 0845  February 2023: BUN 16, creatinine 0.85, potassium 4.5, AST 39, ALT 65, hemoglobin A1c 5.1%, hemoglobin 14.2, platelets 101  Other Studies Reviewed Today:  Cardiac catheterization 11/11/2020: Ost LAD to Prox LAD lesion is 90% stenosed. Prox LAD to Mid LAD prior stents are 100% stenosed. Mid RCA lesion is 25% stenosed. The left ventricular systolic function is normal. LV end diastolic pressure is normal. The left ventricular ejection fraction is 55-65% by visual estimate. There is no aortic valve stenosis.   Recurrent restenosis of multiple LAD stents, 2013 ([facility]), 2016 [facility] hospital).   Plan for TCTS consult for possible  CABG, LIMA to LAD.    Echocardiogram 11/26/2020:  1. Left ventricular ejection fraction, by estimation, is 60 to 65%. The  left ventricle has normal function. The left ventricle has no regional  wall motion abnormalities. There is moderate left ventricular hypertrophy.  Left ventricular diastolic  parameters are indeterminate. The average left ventricular global  longitudinal strain is -18.9 %. The global longitudinal strain is normal.   2. Right ventricular systolic function is normal. The right ventricular  size is normal. There is mildly elevated pulmonary artery systolic  pressure. The estimated right ventricular systolic pressure is 02.4 mmHg.   3. The mitral valve is normal in structure. Trivial mitral valve  regurgitation. No evidence of mitral stenosis.   4. The aortic valve is tricuspid. Aortic  valve regurgitation is not  visualized. No aortic stenosis is present.   5. The inferior vena cava is normal in size with greater than 50%  respiratory variability, suggesting right atrial pressure of 3 mmHg.   Assessment and Plan:  1.  CAD status post off-pump LIMA to LAD in March 2022.  LVEF normal at 60 to 65%.  He reports no angina symptoms on medical therapy.  Continue aspirin, Toprol-XL, Crestor, and as needed nitroglycerin.  ECG reviewed.  2.  Mixed hyperlipidemia with history of statin myalgias.  He is tolerating Crestor at 10 mg daily at this point and we will recheck a fasting lipid profile.  Last LDL was 106.  Medication Adjustments/Labs and Tests Ordered: Current medicines are reviewed at length with the patient today.  Concerns regarding medicines are outlined above.   Tests Ordered: Orders Placed This Encounter  Procedures   Lipid panel   EKG 12-Lead    Medication Changes: No orders of the defined types were placed in this encounter.   Disposition:  Follow up  6 months.  Signed, Satira Sark, MD, [facility] 05/11/2022 10:43 AM    [place] at [place], [place], [place] [address] Phone: [phone]; Fax: [phone]

## 2022-05-11 ENCOUNTER — Ambulatory Visit: Payer: Medicare Other | Attending: Cardiology | Admitting: Cardiology

## 2022-05-11 ENCOUNTER — Encounter: Payer: Self-pay | Admitting: Cardiology

## 2022-05-11 VITALS — BP 148/70 | HR 80 | Ht 70.0 in | Wt 191.8 lb

## 2022-05-11 DIAGNOSIS — E782 Mixed hyperlipidemia: Secondary | ICD-10-CM

## 2022-05-11 DIAGNOSIS — I25119 Atherosclerotic heart disease of native coronary artery with unspecified angina pectoris: Secondary | ICD-10-CM

## 2022-05-11 DIAGNOSIS — Z79899 Other long term (current) drug therapy: Secondary | ICD-10-CM

## 2022-05-11 NOTE — Patient Instructions (Addendum)
Medication Instructions:  Your physician recommends that you continue on your current medications as directed. Please refer to the Current Medication list given to you today.  Labwork: Your physician recommends that you return for a FASTING lipid profile. Please do not eat or drink for at least 8 hours when you have this done. You may take your medications that morning with a sip of water. Please complete this within the next week This may be done at [facility] ([place]) Monday-Friday from 8:00 am - 4:00 pm. No appointment is needed.  Testing/Procedures: none  Follow-Up: Your physician recommends that you schedule a follow-up appointment in: 6 months  Any Other Special Instructions Will Be Listed Below (If Applicable).  If you need a refill on your cardiac medications before your next appointment, please call your pharmacy.

## 2022-05-12 DIAGNOSIS — Z79899 Other long term (current) drug therapy: Secondary | ICD-10-CM | POA: Diagnosis not present

## 2022-05-12 DIAGNOSIS — E782 Mixed hyperlipidemia: Secondary | ICD-10-CM | POA: Diagnosis not present

## 2022-05-12 DIAGNOSIS — I25119 Atherosclerotic heart disease of native coronary artery with unspecified angina pectoris: Secondary | ICD-10-CM | POA: Diagnosis not present

## 2022-05-13 LAB — LIPID PANEL
Cholesterol: 192 mg/dL (ref <200)
HDL: 51 mg/dL (ref >=40)
LDL Cholesterol (Calc): 88 mg/dL (calc)
Non-HDL Cholesterol (Calc): 141 mg/dL (calc) — ABNORMAL HIGH (ref <130)
Total CHOL/HDL Ratio: 3.8 (calc) (ref <5.0)
Triglycerides: 394 mg/dL — ABNORMAL HIGH (ref <150)

## 2022-06-07 DIAGNOSIS — D3614 Benign neoplasm of peripheral nerves and autonomic nervous system of thorax: Secondary | ICD-10-CM | POA: Diagnosis not present

## 2022-06-07 DIAGNOSIS — D2271 Melanocytic nevi of right lower limb, including hip: Secondary | ICD-10-CM | POA: Diagnosis not present

## 2022-06-07 DIAGNOSIS — D2262 Melanocytic nevi of left upper limb, including shoulder: Secondary | ICD-10-CM | POA: Diagnosis not present

## 2022-06-07 DIAGNOSIS — Z1283 Encounter for screening for malignant neoplasm of skin: Secondary | ICD-10-CM | POA: Diagnosis not present

## 2022-06-07 DIAGNOSIS — D2261 Melanocytic nevi of right upper limb, including shoulder: Secondary | ICD-10-CM | POA: Diagnosis not present

## 2022-06-07 DIAGNOSIS — L570 Actinic keratosis: Secondary | ICD-10-CM | POA: Diagnosis not present

## 2022-06-07 DIAGNOSIS — D2272 Melanocytic nevi of left lower limb, including hip: Secondary | ICD-10-CM | POA: Diagnosis not present

## 2022-06-07 DIAGNOSIS — L814 Other melanin hyperpigmentation: Secondary | ICD-10-CM | POA: Diagnosis not present

## 2022-06-07 DIAGNOSIS — Z85828 Personal history of other malignant neoplasm of skin: Secondary | ICD-10-CM | POA: Diagnosis not present

## 2022-06-07 DIAGNOSIS — Z08 Encounter for follow-up examination after completed treatment for malignant neoplasm: Secondary | ICD-10-CM | POA: Diagnosis not present

## 2022-06-07 DIAGNOSIS — D225 Melanocytic nevi of trunk: Secondary | ICD-10-CM | POA: Diagnosis not present

## 2022-07-06 ENCOUNTER — Ambulatory Visit: Payer: Medicare Other | Admitting: Physician Assistant

## 2022-10-17 ENCOUNTER — Other Ambulatory Visit: Payer: Self-pay | Admitting: Cardiology

## 2022-11-14 ENCOUNTER — Telehealth: Payer: Self-pay | Admitting: Cardiology

## 2022-11-14 NOTE — Telephone Encounter (Signed)
Patient's wife is requesting to have orders for lab work placed: liver, lipid, and any others recommended by Dr. Domenic Polite. Please call to confirm when orders have been placed. They plan to go to [facility].

## 2022-11-14 NOTE — Telephone Encounter (Signed)
Per wife, Loletha Carrow, patient did lab work with the [facility] in [place] in January 2024 and Liver function was abnormal. Patient wanted to repeat lab work prior to 11/21/2022 visit with Domenic Polite. Says they have result at home and will bring to visit next week. Advised to up load copy to mychart for review by provider. Medications reviewed to confirm crestor and fish oil doses. Medication profile updated. Verbalized understanding.

## 2022-11-21 ENCOUNTER — Ambulatory Visit: Payer: Medicare Other | Attending: Cardiology | Admitting: Cardiology

## 2022-11-21 ENCOUNTER — Encounter: Payer: Self-pay | Admitting: Cardiology

## 2022-11-21 VITALS — BP 144/72 | HR 81 | Ht 70.0 in | Wt 188.2 lb

## 2022-11-21 DIAGNOSIS — Z79899 Other long term (current) drug therapy: Secondary | ICD-10-CM

## 2022-11-21 DIAGNOSIS — I25119 Atherosclerotic heart disease of native coronary artery with unspecified angina pectoris: Secondary | ICD-10-CM | POA: Diagnosis not present

## 2022-11-21 DIAGNOSIS — E782 Mixed hyperlipidemia: Secondary | ICD-10-CM

## 2022-11-21 MED ORDER — NITROGLYCERIN 0.4 MG SL SUBL: 0.4000 mg | SUBLINGUAL_TABLET | SUBLINGUAL | 3 refills | Status: DC | PRN

## 2022-11-21 NOTE — Patient Instructions (Addendum)
Medication Instructions:  Your physician recommends that you continue on your current medications as directed. Please refer to the Current Medication list given to you today.  Labwork: Your physician recommends that you return for a FASTING lipid/liver profile. Please do not eat or drink for at least 8 hours when you have this done. You may take your medications that morning with a sip of water. [facility]  Testing/Procedures: none  Follow-Up: Your physician recommends that you schedule a follow-up appointment in: 6 months  Any Other Special Instructions Will Be Listed Below (If Applicable).  If you need a refill on your cardiac medications before your next appointment, please call your pharmacy.

## 2022-11-21 NOTE — Progress Notes (Signed)
    Cardiology Office Note  Date: 11/21/2022   ID: Beverly, Bosio Nov 21, 1950, MRN [id number]  History of Present Illness: [name] is a 72 y.o. male last seen in September 2023.  He is here today with his wife for follow-up visit.  Reports no angina or nitroglycerin use.  He walks about 2 miles each day with his dog and has been focusing on diet and weight loss since earlier in the year.  He is lost nearly 10 pounds.  I did review lab work obtained through the [facility] system back in January.  At that time he had not been taking Crestor due to myalgias.  Total cholesterol was 295 with triglycerides 201, HDL 51, and LDL 204.  He went back on Crestor 5 mg daily since that time.  I reviewed his cardiac medications which are otherwise stable.  Physical Exam: VS:  BP (!) 144/72   Pulse 81   Ht 5\' 10"  (1.778 m)   Wt 188 lb 3.2 oz (85.4 kg)   SpO2 97%   BMI 27.00 kg/m , BMI Body mass index is 27 kg/m.  Wt Readings from Last 3 Encounters:  11/21/22 188 lb 3.2 oz (85.4 kg)  05/11/22 191 lb 12.8 oz (87 kg)  11/01/21 194 lb 3.2 oz (88.1 kg)    General: Patient appears comfortable at rest. HEENT: Conjunctiva and lids normal. Neck: Supple, no elevated JVP or carotid bruits. Lungs: Clear to auscultation, nonlabored breathing at rest. Cardiac: Regular rate and rhythm, no S3 or significant systolic murmur. Extremities: No pitting edema.  ECG:  An ECG dated 05/11/2022 was personally reviewed today and demonstrated:  Sinus rhythm with LVH and repolarization changes.  Labwork: No results found for requested labs within last 365 days.     Component Value Date/Time   CHOL 192 05/12/2022 0939   TRIG 394 (H) 05/12/2022 0939   HDL 51 05/12/2022 0939   CHOLHDL 3.8 05/12/2022 0939   VLDL 52 (H) 01/30/2013 1311   LDLCALC 88 05/12/2022 0939   LDLDIRECT 120 (H) 01/03/2013 0845   Other Studies Reviewed Today:  No interval cardiac testing for review today.  Assessment and  Plan:  1.  CAD status post DES to the LAD in 2007 with D1 and D2 disease managed medically, ultimately underwent off-pump LIMA to LAD in April 2022.  LVEF 60 to 65%.  He does not report any active angina at this time on medical therapy.  Walking for exercise.  Continue aspirin, Toprol-XL, omega-3 supplements, and Crestor.  Refill provided for as needed nitroglycerin.  2.  Mixed hyperlipidemia.  He does have a history of statin myalgias. LDL in September 2023 had come down to 88 on Crestor 10 mg daily, but he was not able to tolerate it and ultimately stopped therapy.  LDL had risen in January up to 204 off therapy.  He has since resumed Crestor at 5 mg daily.  Also working on diet and exercise.  Recheck FLP and LFTs.  Suspect we will need to refer him to the lipid clinic for discussion of PCSK9 inhibitors to get his LDL to goal..  Disposition:  Follow up  6 months.  Signed, Satira Sark, M.D., F.A.C.C.

## 2022-12-01 DIAGNOSIS — Z79899 Other long term (current) drug therapy: Secondary | ICD-10-CM | POA: Diagnosis not present

## 2022-12-01 DIAGNOSIS — I25119 Atherosclerotic heart disease of native coronary artery with unspecified angina pectoris: Secondary | ICD-10-CM | POA: Diagnosis not present

## 2022-12-01 DIAGNOSIS — E782 Mixed hyperlipidemia: Secondary | ICD-10-CM | POA: Diagnosis not present

## 2022-12-02 ENCOUNTER — Telehealth: Payer: Self-pay

## 2022-12-02 DIAGNOSIS — E782 Mixed hyperlipidemia: Secondary | ICD-10-CM

## 2022-12-02 LAB — HEPATIC FUNCTION PANEL
AG Ratio: 2.3 (calc) (ref 1.0–2.5)
ALT: 58 U/L — ABNORMAL HIGH (ref 9–46)
AST: 40 U/L — ABNORMAL HIGH (ref 10–35)
Albumin: 4.9 g/dL (ref 3.6–5.1)
Alkaline phosphatase (APISO): 50 U/L (ref 35–144)
Bilirubin, Direct: 0.2 mg/dL (ref 0.0–0.2)
Globulin: 2.1 g/dL (calc) (ref 1.9–3.7)
Indirect Bilirubin: 0.6 mg/dL (calc) (ref 0.2–1.2)
Total Bilirubin: 0.8 mg/dL (ref 0.2–1.2)
Total Protein: 7 g/dL (ref 6.1–8.1)

## 2022-12-02 LAB — LIPID PANEL
Cholesterol: 171 mg/dL (ref <200)
HDL: 71 mg/dL (ref >=40)
LDL Cholesterol (Calc): 82 mg/dL (calc)
Non-HDL Cholesterol (Calc): 100 mg/dL (calc) (ref <130)
Total CHOL/HDL Ratio: 2.4 (calc) (ref <5.0)
Triglycerides: 85 mg/dL (ref <150)

## 2022-12-02 NOTE — Telephone Encounter (Signed)
Results discussed with wife.They agree to meet with Lipid Clinic in [place].  Referral placed to lipid clinic   PCP copied

## 2022-12-02 NOTE — Telephone Encounter (Signed)
-----   Message from Satira Sark, MD sent at 12/02/2022  4:26 PM EDT ----- Results reviewed. AST and ALT mildly elevated. LDL 82 back on low dose Crestor, but with statin intolerance discussed at recent visit. Suggest lipid clinic referral to pharm D for review of PCSK9 inhibitor options.

## 2022-12-06 DIAGNOSIS — Z85828 Personal history of other malignant neoplasm of skin: Secondary | ICD-10-CM | POA: Diagnosis not present

## 2022-12-06 DIAGNOSIS — Z08 Encounter for follow-up examination after completed treatment for malignant neoplasm: Secondary | ICD-10-CM | POA: Diagnosis not present

## 2022-12-06 DIAGNOSIS — L570 Actinic keratosis: Secondary | ICD-10-CM | POA: Diagnosis not present

## 2022-12-10 ENCOUNTER — Other Ambulatory Visit: Payer: Self-pay | Admitting: Cardiology

## 2022-12-29 ENCOUNTER — Encounter: Payer: Self-pay | Admitting: Nurse Practitioner

## 2022-12-29 ENCOUNTER — Ambulatory Visit: Payer: Medicare Other | Attending: Nurse Practitioner | Admitting: Nurse Practitioner

## 2022-12-29 VITALS — BP 139/72 | HR 90 | Ht 70.0 in | Wt 181.8 lb

## 2022-12-29 DIAGNOSIS — I10 Essential (primary) hypertension: Secondary | ICD-10-CM

## 2022-12-29 DIAGNOSIS — F1010 Alcohol abuse, uncomplicated: Secondary | ICD-10-CM

## 2022-12-29 DIAGNOSIS — R079 Chest pain, unspecified: Secondary | ICD-10-CM

## 2022-12-29 DIAGNOSIS — E785 Hyperlipidemia, unspecified: Secondary | ICD-10-CM

## 2022-12-29 DIAGNOSIS — R000 Tachycardia, unspecified: Secondary | ICD-10-CM | POA: Diagnosis not present

## 2022-12-29 DIAGNOSIS — I2510 Atherosclerotic heart disease of native coronary artery without angina pectoris: Secondary | ICD-10-CM | POA: Diagnosis not present

## 2022-12-29 DIAGNOSIS — R002 Palpitations: Secondary | ICD-10-CM | POA: Diagnosis not present

## 2022-12-29 MED ORDER — METOPROLOL TARTRATE 25 MG PO TABS: 12.5000 mg | ORAL_TABLET | Freq: Two times a day (BID) | ORAL | 1 refills | Status: DC

## 2022-12-29 NOTE — Patient Instructions (Addendum)
Medication Instructions:  Your physician has recommended you make the following change in your medication:  STOP metoprolol succinate START metoprolol tartrate 12.5 mg twice a day, may take additional tablet daily as needed for palpitations Continue all other medications as directed  Labwork: none  Testing/Procedures: none  Follow-Up:  Your physician recommends that you schedule a follow-up appointment in: 3 months  Any Other Special Instructions Will Be Listed Below (If Applicable).  Please look into the [facility] App.  If you need a refill on your cardiac medications before your next appointment, please call your pharmacy.

## 2022-12-29 NOTE — Progress Notes (Signed)
Office Visit    Patient Name: [name] Date of Encounter: 12/29/2022  PCP:  Wallace Cullens, MD   [facility]  Cardiologist:  Nona Dell, MD  Advanced Practice Provider:  No care team member to display Electrophysiologist:  None   Chief Complaint    JAMEZ AMBROCIO is a 72 y.o. male with a hx of CAD, HLD, HTN, gout, GERD, barrett's esophagus, Etoh abuse, and hx of hiatal hernia, who presents today for evaluation for tachycardia.   Past Medical History    Past Medical History:  Diagnosis Date   Barrett's esophagus    Coronary artery disease    ACS in 02/2006: DES to subtotal LAD; repeat cath in 06/2006: Residual 80% small D1 and 70% D2-medical therapy advised; off-pump LIMA to LAD April 2022   GERD (gastroesophageal reflux disease)    Gout    History of hiatal hernia    History of pneumonia    Hyperlipidemia    Hypertension    Low back pain    Past Surgical History:  Procedure Laterality Date   CARDIAC CATHETERIZATION     COLONOSCOPY     CORONARY ANGIOPLASTY WITH STENT PLACEMENT  02/17/2012    proximal LAD   ESOPHAGOGASTRODUODENOSCOPY N/A 07/24/2014   Procedure: ESOPHAGOGASTRODUODENOSCOPY (EGD);  Surgeon: Malissa Hippo, MD;  [facility];  Service: Endoscopy;  Laterality: N/A;  210   LEFT HEART CATH AND CORONARY ANGIOGRAPHY N/A 11/11/2020   Procedure: LEFT HEART CATH AND CORONARY ANGIOGRAPHY;  Surgeon: Corky Crafts, MD;  Location: [facility] INVASIVE CV LAB;  Service: Cardiovascular;  Laterality: N/A;   MALONEY DILATION N/A 07/24/2014   Procedure: Elease Hashimoto DILATION;  Surgeon: Malissa Hippo, MD;  Location: [facility];  Service: Endoscopy;  Laterality: N/A;   PERCUTANEOUS CORONARY STENT INTERVENTION (PCI-S) N/A 02/17/2012   Procedure: PERCUTANEOUS CORONARY STENT INTERVENTION (PCI-S);  Surgeon: Kathleene Hazel, MD;  Location: [facility] CATH LAB;  Service: Cardiovascular;  Laterality: N/A;   TEE WITHOUT CARDIOVERSION N/A  12/18/2020   Procedure: TRANSESOPHAGEAL ECHOCARDIOGRAM (TEE);  Surgeon: Corliss Skains, MD;  Location: [facility] OR;  Service: Open Heart Surgery;  Laterality: N/A;    Allergies  Allergies  Allergen Reactions   Crestor [Rosuvastatin] Other (See Comments)    Myalgias; also Vytorin--  PT CAN SOMEWHAT TOLERATE CRESTOR 10 mg   Isosorbide Mononitrate [Isosorbide Nitrate] Other (See Comments)    Unable to tolerate Imdur at a dose of 60 mg secondary to malaise and headache   Vytorin [Ezetimibe-Simvastatin] Other (See Comments)    Myalgias; also rosuvastatin     History of Present Illness    [name] is a 72 y.o. male with a PMH as mentioned above.  Previous CV history includes drug-eluting stent to LAD in 2007, D1/D2 disease medically managed.  Underwent off-pump LIMA to LAD in 2022.  Last seen by Dr. Diona Browner on November 21, 2022.  Was doing well at the time.  Was active walking his dog and focusing on a weight loss and healthy diet.  Denied any chest pain.  Today he presents for evaluation for with his wife. He states he had a recent episode of what felt like "rapid heartbeat."  Took 2 nitroglycerin, symptoms eventually subsided, denied any chest pain.  Episode lasted about 5 to 7 minutes. Denies any chest pain, shortness of breath, syncope, presyncope, dizziness, orthopnea, PND, swelling or significant weight changes, acute bleeding, or claudication.  Drinks around no more than 2 cups of  coffee in the morning.  Yesterday, reported drinking 6-8 bottles of beer.  Says he is trying to quit.    EKGs/Labs/Other Studies Reviewed:   The following studies were reviewed today:   EKG:  EKG is ordered today.  The ekg ordered today demonstrates normal sinus rhythm, 90 bpm, LVH with repolarization abnormality, no acute ischemic changes.  Echo 11/2020:  1. Left ventricular ejection fraction, by estimation, is 60 to 65%. The  left ventricle has normal function. The left ventricle has no regional   wall motion abnormalities. There is moderate left ventricular hypertrophy.  Left ventricular diastolic  parameters are indeterminate. The average left ventricular global  longitudinal strain is -18.9 %. The global longitudinal strain is normal.   2. Right ventricular systolic function is normal. The right ventricular  size is normal. There is mildly elevated pulmonary artery systolic  pressure. The estimated right ventricular systolic pressure is 35.5 mmHg.   3. The mitral valve is normal in structure. Trivial mitral valve  regurgitation. No evidence of mitral stenosis.   4. The aortic valve is tricuspid. Aortic valve regurgitation is not  visualized. No aortic stenosis is present.   5. The inferior vena cava is normal in size with greater than 50%  respiratory variability, suggesting right atrial pressure of 3 mmHg.  LHC 11/2020:   Ost LAD to Prox LAD lesion is 90% stenosed. Prox LAD to Mid LAD prior stents are 100% stenosed. Mid RCA lesion is 25% stenosed. The left ventricular systolic function is normal. LV end diastolic pressure is normal. The left ventricular ejection fraction is 55-65% by visual estimate. There is no aortic valve stenosis.   Recurrent restenosis of multiple LAD stents, 2013 ([facility]), 2016 [facility] hospital).   Plan for TCTS consult for possible CABG, LIMA to LAD.   Recent Labs: 12/01/2022: ALT 58  Recent Lipid Panel    Component Value Date/Time   CHOL 171 12/01/2022 1029   TRIG 85 12/01/2022 1029   HDL 71 12/01/2022 1029   CHOLHDL 2.4 12/01/2022 1029   VLDL 52 (H) 01/30/2013 1311   LDLCALC 82 12/01/2022 1029   LDLDIRECT 120 (H) 01/03/2013 0845    Risk Assessment/Calculations:   The 10-year ASCVD risk score (Arnett DK, et al., 2019) is: 19%   Values used to calculate the score:     Age: 61 years     Sex: Male     Is Non-Hispanic African American: No     Diabetic: No     Tobacco smoker: No     Systolic Blood Pressure: 139 mmHg     Is BP treated: Yes      HDL Cholesterol: 71 mg/dL     Total Cholesterol: 171 mg/dL  Home Medications   Current Meds  Medication Sig   allopurinol (ZYLOPRIM) 100 MG tablet Take 100 mg by mouth daily as needed (gout).   aspirin EC 81 MG tablet Take 81 mg by mouth daily. Swallow whole.   cetirizine (ZYRTEC) 10 MG tablet Take 10 mg by mouth daily.   Coenzyme Q10 (CO Q-10) 100 MG CAPS Take 1 capsule by mouth every morning.   colchicine 0.6 MG tablet Take 0.6 mg by mouth daily.   Cyanocobalamin (VITAMIN B12 PO) Take 1 tablet by mouth daily.   Magnesium 250 MG TABS Take 250 mg by mouth daily.   milk thistle 175 MG tablet Take 250 mg by mouth daily.   naproxen sodium (ALEVE) 220 MG tablet Take 220 mg by mouth daily as needed.  nitroGLYCERIN (NITROSTAT) 0.4 MG SL tablet Place 1 tablet (0.4 mg total) under the tongue every 5 (five) minutes x 3 doses as needed for chest pain (if no relief after 3rd dose, proceed to ED for an evaluation or call 911).   Omega-3 Fatty Acids (FISH OIL) 1000 MG CAPS Take 4 capsules by mouth 2 (two) times daily.   OVER THE COUNTER MEDICATION Super digest away. Takes one with every meal.   rosuvastatin (CRESTOR) 5 MG tablet Take 5 mg by mouth daily.   sildenafil (VIAGRA) 100 MG tablet Take 100 mg by mouth daily as needed for erectile dysfunction.   Turmeric (QC TUMERIC COMPLEX PO) Take by mouth.    metoprolol succinate (TOPROL-XL) 25 MG 24 hr tablet Take 1 tablet by mouth once daily     Review of Systems    All other systems reviewed and are otherwise negative except as noted above.  Physical Exam    VS:  BP 139/72 (BP Location: Left Arm, Patient Position: Sitting, Cuff Size: Normal)   Pulse 90   Ht 5\' 10"  (1.778 m)   Wt 181 lb 12.8 oz (82.5 kg)   SpO2 98%   BMI 26.09 kg/m  , BMI Body mass index is 26.09 kg/m.  Wt Readings from Last 3 Encounters:  12/29/22 181 lb 12.8 oz (82.5 kg)  11/21/22 188 lb 3.2 oz (85.4 kg)  05/11/22 191 lb 12.8 oz (87 kg)     GEN: Well nourished,  well developed, in no acute distress. HEENT: normal. Neck: Supple, no JVD, carotid bruits, or masses. Cardiac: S1/S2, RRR, no murmurs, rubs, or gallops. No clubbing, cyanosis, edema.  Radials/PT 2+ and equal bilaterally.  Respiratory:  Respirations regular and unlabored, clear to auscultation bilaterally. MS: No deformity or atrophy. Skin: Warm and dry, no rash. Neuro:  Strength and sensation are intact. Psych: Normal affect.  Assessment & Plan    Palpitations, tachycardia? 1 time, limited episode of rapid heartbeat lasting around 5 to 10 minutes.  Relieved after taking 2 nitroglycerin.  Denies any chest pain surrounding event.  Episode of alcohol binge-approximately 6-8 bottles of beer consumed day prior to event.  Politely defers monitor and lab work at this time.  Will stop metoprolol succinate and start metoprolol tartrate 12.5 mg twice daily, may take an extra tablet daily as needed for palpitations.  Discussed to wean off alcohol and avoid caffeine.  Recommended [facility] mobile app to evaluate palpitations at home. Heart healthy diet and regular cardiovascular exercise encouraged. ED precautions discussed.   CAD Hx of drug-eluting stent to LAD in 2007, D1/D2 disease medically managed.  Underwent off-pump LIMA to LAD in 2022. Stable with no anginal symptoms. No indication for ischemic evaluation. No acute ischemic changes noted on EKG today.  Continue aspirin, rosuvastatin, and nitroglycerin as needed.  Changing metoprolol succinate to metoprolol to tartrate as mentioned above. Heart healthy diet and regular cardiovascular exercise encouraged. ED precautions discussed.   HLD Continue rosuvastatin.  Has upcoming appointment arranged with lipid clinic per wife's report for consideration of Repatha/Praluent. Heart healthy diet and regular cardiovascular exercise encouraged.   HTN BP elevated on arrival.  BP well-controlled at home.  Switching metoprolol succinate to metoprolol to tartrate.   Continue rest of medication regimen. Discussed to monitor BP at home at least 2 hours after medications and sitting for 5-10 minutes. Heart healthy diet and regular cardiovascular exercise encouraged.   Hx of Etoh abuse Recent alcohol binge day before episode of tachycardia. Encouraged him to continue  to wean off alcohol. Heart healthy diet and regular cardiovascular exercise encouraged. Continue to follow with PCP.   Disposition: Follow up in 3 month(s) with Nona Dell, MD or APP.  Signed, Sharlene Dory, NP 12/31/2022, 8:44 PM [facility]

## 2022-12-31 ENCOUNTER — Encounter: Payer: Self-pay | Admitting: Nurse Practitioner

## 2023-01-12 DIAGNOSIS — E7800 Pure hypercholesterolemia, unspecified: Secondary | ICD-10-CM | POA: Diagnosis not present

## 2023-01-12 DIAGNOSIS — K2270 Barrett's esophagus without dysplasia: Secondary | ICD-10-CM | POA: Diagnosis not present

## 2023-01-12 DIAGNOSIS — I2510 Atherosclerotic heart disease of native coronary artery without angina pectoris: Secondary | ICD-10-CM | POA: Diagnosis not present

## 2023-01-12 DIAGNOSIS — Z299 Encounter for prophylactic measures, unspecified: Secondary | ICD-10-CM | POA: Diagnosis not present

## 2023-01-12 DIAGNOSIS — K5900 Constipation, unspecified: Secondary | ICD-10-CM | POA: Diagnosis not present

## 2023-01-17 ENCOUNTER — Telehealth: Payer: Self-pay | Admitting: Pharmacist

## 2023-01-17 ENCOUNTER — Encounter: Payer: Self-pay | Admitting: Student

## 2023-01-17 ENCOUNTER — Ambulatory Visit: Payer: Medicare Other | Attending: Cardiology | Admitting: Student

## 2023-01-17 ENCOUNTER — Other Ambulatory Visit (HOSPITAL_COMMUNITY): Payer: Self-pay

## 2023-01-17 ENCOUNTER — Telehealth: Payer: Self-pay

## 2023-01-17 DIAGNOSIS — E785 Hyperlipidemia, unspecified: Secondary | ICD-10-CM | POA: Diagnosis not present

## 2023-01-17 NOTE — Telephone Encounter (Signed)
Pharmacy Patient Advocate Encounter  Prior Authorization for REPATHA has been approved.    PA# [id number] Effective dates: 01/17/23 through 07/20/23  Haze Rushing, CPhT Pharmacy Patient Advocate Specialist Direct Number: [phone] Fax: [phone]

## 2023-01-17 NOTE — Assessment & Plan Note (Signed)
Assessment:  LDL goal: < 70 mg/dl last LDLc 82 mg/dl (19/14/7829) while on Crestor 5 mg daily  Intolerances: Crestor 10 and 5 mg, Lipitor 40 mg,pravastatin 10 mg, ALT AST elevation from  Crestor 5 mg, muscle cramps that affects mobility- most statins and fenofibrate  Risk Factors: CAD status post DES to the LAD in 2007, mixed hyperlipidemia Discussed next potential options (PCSK-9 inhibitors, bempedoic acid and inclisiran); cost, dosing efficacy, side effects   Plan: Will repeat lipid penal and LFT today  Will apply for PA for PCSK9i; will inform patient upon approval Lipid lab due in 2-3 months after starting [facility]

## 2023-01-17 NOTE — Patient Instructions (Addendum)
Your Results:             Your most recent labs Goal  Total Cholesterol 171 < 200  Triglycerides 85 < 150  HDL (happy/good cholesterol) 71 > 40  LDL (lousy/bad cholesterol 82 < 70   Medication changes: We will start the process to get PCSK9i(Repatha or Praluent) covered by your insurance.  Once the prior authorization is complete, we will call you to let you know and confirm pharmacy information.      Praluent is a cholesterol medication that improved your body's ability to get rid of "bad cholesterol" known as LDL. It can lower your LDL up to 60%. It is an injection that is given under the skin every 2 weeks. The most common side effects of Praluent include runny nose, symptoms of the common cold, rarely flu or flu-like symptoms, back/muscle pain in about 3-4% of the patients, and redness, pain, or bruising at the injection site.    Repatha is a cholesterol medication that improved your body's ability to get rid of "bad cholesterol" known as LDL. It can lower your LDL up to 60%! It is an injection that is given under the skin every 2 weeks. The most common side effects of Repatha include runny nose, symptoms of the common cold, rarely flu or flu-like symptoms, back/muscle pain in about 3-4% of the patients, and redness, pain, or bruising at the injection site.   Lab orders: We want to repeat labs after 2-3 months.  We will send you a lab order to remind you once we get closer to that time.

## 2023-01-17 NOTE — Telephone Encounter (Signed)
Pharmacy Patient Advocate Encounter   Received notification from [facility] MEDICARE that prior authorization for REPATHA is needed.    PA submitted on 01/17/23 [place] [id number] Status is pending  Haze Rushing, CPhT Pharmacy Patient Advocate Specialist Direct Number: [phone] Fax: [phone]

## 2023-01-17 NOTE — Progress Notes (Signed)
Patient ID: [name]                 DOB: May 14, 1951                    MRN: [id number]      HPI: [name] is a 72 y.o. male patient referred to lipid clinic by Dr.McDowell. PMH is significant for CAD status post DES to the LAD in 2007, mixed hyperlipidemia, GERD  Patient presented with his wife for lipid clinic today, reports he had tried various statins in the past he could not tolerate them due to myalgia. He has made significant diet changes and started doing regular exercise from new year. He has been taking Crestor 5 mg with manageable aches and pain but his LFT is elevated. In the past he had  extremely elevated TG level problem. He has restarted drinking beers lot more lately as he watches more sports now.Reviewed options for lowering LDL cholesterol, including ezetimibe, PCSK-9 inhibitors, bempedoic acid and inclisiran.  Discussed mechanisms of action, dosing, side effects and potential decreases in LDL cholesterol.  Also reviewed cost information and potential options for patient assistance.    Diet: has improved a lot , lot less fat. Follows mediterranean diet   Exercise: 3 miles per day 3 times per week   Family History:  Mother: cholesterol  Father: cholesterol  Sister: CABG at age 64, cholesterol Son: triple bypass at age of 49   Social History:  Alcohol: varies  Smoking: quit 25 years ago   Labs: Lipid Panel     Component Value Date/Time   CHOL 171 12/01/2022 1029   TRIG 85 12/01/2022 1029   HDL 71 12/01/2022 1029   CHOLHDL 2.4 12/01/2022 1029   VLDL 52 (H) 01/30/2013 1311   LDLCALC 82 12/01/2022 1029   LDLDIRECT 120 (H) 01/03/2013 0845    Past Medical History:  Diagnosis Date   Barrett's esophagus    Coronary artery disease    ACS in 02/2006: DES to subtotal LAD; repeat cath in 06/2006: Residual 80% small D1 and 70% D2-medical therapy advised; off-pump LIMA to LAD April 2022   GERD (gastroesophageal reflux disease)    Gout    History of  hiatal hernia    History of pneumonia    Hyperlipidemia    Hypertension    Low back pain     Current Outpatient Medications on File Prior to Visit  Medication Sig Dispense Refill   allopurinol (ZYLOPRIM) 100 MG tablet Take 100 mg by mouth daily as needed (gout).     aspirin EC 81 MG tablet Take 81 mg by mouth daily. Swallow whole.     cetirizine (ZYRTEC) 10 MG tablet Take 10 mg by mouth daily.     Coenzyme Q10 (CO Q-10) 100 MG CAPS Take 1 capsule by mouth every morning.     colchicine 0.6 MG tablet Take 0.6 mg by mouth daily.     Cyanocobalamin (VITAMIN B12 PO) Take 1 tablet by mouth daily.     Magnesium 250 MG TABS Take 250 mg by mouth daily.     metoprolol tartrate (LOPRESSOR) 25 MG tablet Take 0.5 tablets (12.5 mg total) by mouth 2 (two) times daily. 90 tablet 1   milk thistle 175 MG tablet Take 250 mg by mouth daily.     naproxen sodium (ALEVE) 220 MG tablet Take 220 mg by mouth daily as needed.     nitroGLYCERIN (NITROSTAT) 0.4 MG SL tablet Place 1  tablet (0.4 mg total) under the tongue every 5 (five) minutes x 3 doses as needed for chest pain (if no relief after 3rd dose, proceed to ED for an evaluation or call 911). 25 tablet 3   Omega-3 Fatty Acids (FISH OIL) 1000 MG CAPS Take 4 capsules by mouth 2 (two) times daily.     OVER THE COUNTER MEDICATION Super digest away. Takes one with every meal.     rosuvastatin (CRESTOR) 5 MG tablet Take 5 mg by mouth daily.     sildenafil (VIAGRA) 100 MG tablet Take 100 mg by mouth daily as needed for erectile dysfunction.     Turmeric (QC TUMERIC COMPLEX PO) Take by mouth.     No current facility-administered medications on file prior to visit.    Allergies  Allergen Reactions   Crestor [Rosuvastatin] Other (See Comments)    Myalgias; also Vytorin--  PT CAN SOMEWHAT TOLERATE CRESTOR 10 mg   Isosorbide Mononitrate [Isosorbide Nitrate] Other (See Comments)    Unable to tolerate Imdur at a dose of 60 mg secondary to malaise and headache    Vytorin [Ezetimibe-Simvastatin] Other (See Comments)    Myalgias; also rosuvastatin     Assessment/Plan:  1. Hyperlipidemia -  Problem  Hyperlipidemia   TG>1000; lipid profile 06/2011:220, 369, 45, 101. 03/2012:187,364,45,69-rosuvastatin 10 mg; 08/2012:303, 1481, 34; 10/2012:244, 565, 42, 141 (direct); 01/2013: Direct LDL-120, Profile: 214, 336, 49, 98; 02/2013:185,261,46,87  Intolerances: Crestor 10 and 5 mg, Lipitor 40 , pravastatin 10 mg   - ALT AST elevation from  Crestor 5 mg, muscle cramps that affects mobility- most statins and fenofibrate  Risk Factors: CAD status post DES to the LAD in 2007, mixed hyperlipidemia Lab Results  Component Value Date   CHOL 171 12/01/2022   HDL 71 12/01/2022   LDLCALC 82 12/01/2022   LDLDIRECT 120 (H) 01/03/2013   TRIG 85 12/01/2022   CHOLHDL 2.4 12/01/2022      Hyperlipidemia Assessment:  LDL goal: < 70 mg/dl last LDLc 82 mg/dl (40/98/1191) while on Crestor 5 mg daily  Intolerances: Crestor 10 and 5 mg, Lipitor 40 mg,pravastatin 10 mg, ALT AST elevation from  Crestor 5 mg, muscle cramps that affects mobility- most statins and fenofibrate  Risk Factors: CAD status post DES to the LAD in 2007, mixed hyperlipidemia Discussed next potential options (PCSK-9 inhibitors, bempedoic acid and inclisiran); cost, dosing efficacy, side effects   Plan: Will repeat lipid penal and LFT today  Will apply for PA for PCSK9i; will inform patient upon approval Lipid lab due in 2-3 months after starting PCSK9i    Thank you,  Carmela Hurt, Pharm.D [facility] A Division of [facility] [address], [place], [place] [address]  Phone: [phone]; Fax: [phone]

## 2023-01-18 ENCOUNTER — Telehealth: Payer: Self-pay | Admitting: Student

## 2023-01-18 ENCOUNTER — Other Ambulatory Visit (HOSPITAL_COMMUNITY): Payer: Self-pay

## 2023-01-18 DIAGNOSIS — E785 Hyperlipidemia, unspecified: Secondary | ICD-10-CM

## 2023-01-18 LAB — HEPATIC FUNCTION PANEL
ALT: 31 IU/L (ref 0–44)
AST: 31 IU/L (ref 0–40)
Albumin: 4.9 g/dL (ref 3.9–4.9)
Alkaline Phosphatase: 75 IU/L (ref 44–121)
Bilirubin Total: 0.6 mg/dL (ref 0.0–1.2)
Bilirubin, Direct: 0.15 mg/dL (ref 0.00–0.40)
Total Protein: 6.7 g/dL (ref 6.0–8.5)

## 2023-01-18 LAB — LIPID PANEL
Chol/HDL Ratio: 2.6 ratio (ref 0.0–5.0)
Cholesterol, Total: 192 mg/dL (ref 100–199)
HDL: 73 mg/dL (ref >39)
LDL Chol Calc (NIH): 99 mg/dL (ref 0–99)
Triglycerides: 113 mg/dL (ref 0–149)
VLDL Cholesterol Cal: 20 mg/dL (ref 5–40)

## 2023-01-18 NOTE — Telephone Encounter (Signed)
patient's wife is requesting to speak with someone in regards to the medication pre-auth

## 2023-01-19 NOTE — Telephone Encounter (Signed)
Spoke to wife, they have decided not to start PCSK9i. He prefer to continue with his lifestyle modification and start taking Crestor 5 mg twice daily for now, wanted to repeat the lab in 3 months (mid Aug). if LDLc still stays above the goal (<70) ppotment with Dr.Mcdowell in Sept so will discuss with him.  FLP and LFT has been ordered. Patient has marked his calendar for mid Aug.

## 2023-01-31 NOTE — Telephone Encounter (Signed)
Spoke to the wife he does not want to start Repatha at this point. In the past he had lowered is LDLc almost to the goal through dietary interventions. So would like proceed with lifestyle interventions for now and he has appointment with Dr. Diona Browner in Sept,2024. If LDLc is still above the goal will consider initiating Repatha after Sept BW.

## 2023-02-01 NOTE — Telephone Encounter (Signed)
PA for Repatha has been approved but patient does not want to start the therapy. Would like to make dietary changes to lower LDLc first. In 3 months if LDLc still remains above goal will think about going on Repatha

## 2023-03-29 DIAGNOSIS — K2270 Barrett's esophagus without dysplasia: Secondary | ICD-10-CM | POA: Diagnosis not present

## 2023-03-29 DIAGNOSIS — Z7189 Other specified counseling: Secondary | ICD-10-CM | POA: Diagnosis not present

## 2023-03-29 DIAGNOSIS — Z0000 Encounter for general adult medical examination without abnormal findings: Secondary | ICD-10-CM | POA: Diagnosis not present

## 2023-03-29 DIAGNOSIS — M109 Gout, unspecified: Secondary | ICD-10-CM | POA: Diagnosis not present

## 2023-03-29 DIAGNOSIS — I2510 Atherosclerotic heart disease of native coronary artery without angina pectoris: Secondary | ICD-10-CM | POA: Diagnosis not present

## 2023-03-29 DIAGNOSIS — Z299 Encounter for prophylactic measures, unspecified: Secondary | ICD-10-CM | POA: Diagnosis not present

## 2023-04-04 ENCOUNTER — Ambulatory Visit: Payer: Medicare Other | Admitting: Nurse Practitioner

## 2023-05-02 ENCOUNTER — Other Ambulatory Visit: Payer: Self-pay | Admitting: Cardiology

## 2023-06-01 ENCOUNTER — Ambulatory Visit: Payer: Medicare Other | Admitting: Cardiology

## 2023-06-12 ENCOUNTER — Other Ambulatory Visit: Payer: Self-pay | Admitting: Nurse Practitioner

## 2023-06-14 DIAGNOSIS — L570 Actinic keratosis: Secondary | ICD-10-CM | POA: Diagnosis not present

## 2023-06-14 DIAGNOSIS — L814 Other melanin hyperpigmentation: Secondary | ICD-10-CM | POA: Diagnosis not present

## 2023-06-14 DIAGNOSIS — D2371 Other benign neoplasm of skin of right lower limb, including hip: Secondary | ICD-10-CM | POA: Diagnosis not present

## 2023-06-14 DIAGNOSIS — Z1283 Encounter for screening for malignant neoplasm of skin: Secondary | ICD-10-CM | POA: Diagnosis not present

## 2023-07-03 DIAGNOSIS — M9903 Segmental and somatic dysfunction of lumbar region: Secondary | ICD-10-CM | POA: Diagnosis not present

## 2023-07-03 DIAGNOSIS — M47816 Spondylosis without myelopathy or radiculopathy, lumbar region: Secondary | ICD-10-CM | POA: Diagnosis not present

## 2023-07-03 DIAGNOSIS — M5441 Lumbago with sciatica, right side: Secondary | ICD-10-CM | POA: Diagnosis not present

## 2023-07-05 DIAGNOSIS — M47816 Spondylosis without myelopathy or radiculopathy, lumbar region: Secondary | ICD-10-CM | POA: Diagnosis not present

## 2023-07-05 DIAGNOSIS — M9903 Segmental and somatic dysfunction of lumbar region: Secondary | ICD-10-CM | POA: Diagnosis not present

## 2023-07-05 DIAGNOSIS — M5441 Lumbago with sciatica, right side: Secondary | ICD-10-CM | POA: Diagnosis not present

## 2023-07-13 DIAGNOSIS — M5441 Lumbago with sciatica, right side: Secondary | ICD-10-CM | POA: Diagnosis not present

## 2023-07-13 DIAGNOSIS — M47816 Spondylosis without myelopathy or radiculopathy, lumbar region: Secondary | ICD-10-CM | POA: Diagnosis not present

## 2023-07-13 DIAGNOSIS — M9903 Segmental and somatic dysfunction of lumbar region: Secondary | ICD-10-CM | POA: Diagnosis not present

## 2023-07-31 DIAGNOSIS — M5441 Lumbago with sciatica, right side: Secondary | ICD-10-CM | POA: Diagnosis not present

## 2023-07-31 DIAGNOSIS — M9903 Segmental and somatic dysfunction of lumbar region: Secondary | ICD-10-CM | POA: Diagnosis not present

## 2023-07-31 DIAGNOSIS — M47816 Spondylosis without myelopathy or radiculopathy, lumbar region: Secondary | ICD-10-CM | POA: Diagnosis not present

## 2023-09-11 ENCOUNTER — Encounter: Payer: Self-pay | Admitting: Cardiology

## 2023-09-11 ENCOUNTER — Ambulatory Visit: Payer: Medicare Other | Attending: Cardiology | Admitting: Cardiology

## 2023-09-11 VITALS — BP 138/72 | HR 78 | Ht 70.0 in | Wt 187.2 lb

## 2023-09-11 DIAGNOSIS — E782 Mixed hyperlipidemia: Secondary | ICD-10-CM

## 2023-09-11 DIAGNOSIS — I25119 Atherosclerotic heart disease of native coronary artery with unspecified angina pectoris: Secondary | ICD-10-CM

## 2023-09-11 DIAGNOSIS — M791 Myalgia, unspecified site: Secondary | ICD-10-CM | POA: Diagnosis not present

## 2023-09-11 DIAGNOSIS — T466X5A Adverse effect of antihyperlipidemic and antiarteriosclerotic drugs, initial encounter: Secondary | ICD-10-CM

## 2023-09-11 DIAGNOSIS — T466X5D Adverse effect of antihyperlipidemic and antiarteriosclerotic drugs, subsequent encounter: Secondary | ICD-10-CM

## 2023-09-11 MED ORDER — NEXLIZET 180-10 MG PO TABS: 1.0000 | ORAL_TABLET | Freq: Every day | ORAL | 0 refills | Status: DC

## 2023-09-11 MED ORDER — NITROGLYCERIN 0.4 MG SL SUBL: 0.4000 mg | SUBLINGUAL_TABLET | SUBLINGUAL | 3 refills | Status: DC | PRN

## 2023-09-11 NOTE — Progress Notes (Signed)
    Cardiology Office Note  Date: 09/11/2023   ID: [name], [name] Jan 23, 1951, MRN [id number]  History of Present Illness: [name] is a 73 y.o. male last seen in April by Ms. Miriam NP, I reviewed the note.  He is here with his wife for a follow-up visit.  Reports no increasing angina on current regimen, stable NYHA class II dyspnea.  Typically walks his dog for exercise.  He does not report any specific change in stamina.  He was seen in the lipid clinic in May 2024, recommended to start on PCSK9 inhibitor at that time given statin intolerance and LDL not at goal.  He elected not to start this therapy, states that he is concerned about using injectable agent.  He has had myalgias even on very low-dose Crestor  and does not take it regularly.  His LDL in May of last year was 99, HDL 73.  I reviewed his medications.  Current cardiovascular regimen includes aspirin , Lopressor , Crestor , omega-3 supplements, and as needed nitroglycerin .  Physical Exam: VS:  BP 138/72   Pulse 78   Ht 5' 10 (1.778 m)   Wt 187 lb 3.2 oz (84.9 kg)   SpO2 98%   BMI 26.86 kg/m , BMI Body mass index is 26.86 kg/m.  Wt Readings from Last 3 Encounters:  09/11/23 187 lb 3.2 oz (84.9 kg)  12/29/22 181 lb 12.8 oz (82.5 kg)  11/21/22 188 lb 3.2 oz (85.4 kg)    General: Patient appears comfortable at rest. HEENT: Conjunctiva and lids normal. Neck: Supple, no elevated JVP or carotid bruits. Lungs: Clear to auscultation, nonlabored breathing at rest. Cardiac: Regular rate and rhythm, no S3 or significant systolic murmur.  ECG:  An ECG dated 12/29/2022 was personally reviewed today and demonstrated:  Sinus rhythm with increased voltage and repolarization abnormalities.  Labwork: 01/17/2023: ALT 31; AST 31     Component Value Date/Time   CHOL 192 01/17/2023 1111   TRIG 113 01/17/2023 1111   HDL 73 01/17/2023 1111   CHOLHDL 2.6 01/17/2023 1111   CHOLHDL 2.4 12/01/2022 1029   VLDL 52 (H) 01/30/2013  1311   LDLCALC 99 01/17/2023 1111   LDLCALC 82 12/01/2022 1029   LDLDIRECT 120 (H) 01/03/2013 0845  May 2024: Cholesterol 192, triglycerides 113, HDL 73, LDL 99  Other Studies Reviewed Today:  No interval cardiac testing for review today.  Assessment and Plan:  1.  CAD status post DES to the LAD in 2007 with D1 and D2 disease managed medically, ultimately underwent off-pump LIMA to LAD in April 2022.  LVEF 60 to 65%.  No increasing angina or nitroglycerin  use.  Continue aspirin , Lopressor , trial of Nexlizet .   2.  Mixed hyperlipidemia.  LDL 99 in May 2024.  He has a history of statin myalgias and prefers to avoid PCSK9 inhibitors as discussed above.  We will trial Nexlizet .  Also discussed diet.  Recheck FLP in 6 months if he continues the medication.  Disposition:  Follow up  6 months.  Signed, Jayson JUDITHANN Sierras, M.D., F.A.C.C. [facility]

## 2023-09-11 NOTE — Patient Instructions (Addendum)
 Medication Instructions:  Your physician has recommended you make the following change in your medication:  Stop crestor  Stop CoQ 10 Start nexlizet  180 mg/10 mg daily.Contact office for a new prescription if you are able to tolerate samples provided. Continue all other medications as prescribed  Labwork: Your physician recommends that you return for a FASTING lipid profile in 6 months just before your next visit if you are able to tolerate nexlizet . Please do not eat or drink for at least 8 hours when you have this done. You may take your medications that morning with a sip of water . [facility] ([address] [place]) or [facility]  Testing/Procedures: none  Follow-Up: Your physician recommends that you schedule a follow-up appointment in: 6 months  Any Other Special Instructions Will Be Listed Below (If Applicable).  If you need a refill on your cardiac medications before your next appointment, please call your pharmacy.

## 2023-09-26 ENCOUNTER — Telehealth: Payer: Self-pay | Admitting: Cardiology

## 2023-09-26 ENCOUNTER — Telehealth: Payer: Self-pay | Admitting: *Deleted

## 2023-09-26 DIAGNOSIS — E782 Mixed hyperlipidemia: Secondary | ICD-10-CM

## 2023-09-26 DIAGNOSIS — T466X5A Adverse effect of antihyperlipidemic and antiarteriosclerotic drugs, initial encounter: Secondary | ICD-10-CM

## 2023-09-26 NOTE — Telephone Encounter (Addendum)
Patient's wife Chip Boer, informed and verbalized understanding of plan. Agrees to Lipid Clinic Referral.

## 2023-09-26 NOTE — Addendum Note (Signed)
Addended by: Eustace Moore on: 09/26/2023 04:18 PM   Modules accepted: Orders

## 2023-09-26 NOTE — Telephone Encounter (Signed)
Pt c/o medication issue:  1. Name of Medication:   Nexlizet  2. How are you currently taking this medication (dosage and times per day)?   As prescribed  3. Are you having a reaction (difficulty breathing--STAT)?   4. What is your medication issue?   Patient stated he has tried this medication and is not working for him.  Patient stated this medication is making his back hurt. Patient wants call back on next steps.

## 2023-09-26 NOTE — Telephone Encounter (Addendum)
Please see previous phone encounter message for follow up.

## 2023-09-26 NOTE — Telephone Encounter (Signed)
Per wife Chip Boer, patient reports after 4 days of taking nexlizet, had began having leg and back pain. Reports after stopping nexlizet, his symptoms improved. Advised that medication profile will be updated.

## 2023-09-26 NOTE — Telephone Encounter (Signed)
-----   Message from Nurse Isabelle Course A sent at 09/11/2023  1:52 PM EST ----- Regarding: FLP IN 6 MTHS IF ABLE TO TOLERATE NEXLIZET Contact patient if note isn't in chart already Will also need rx sent to pharmacy

## 2023-11-08 DIAGNOSIS — M791 Myalgia, unspecified site: Secondary | ICD-10-CM | POA: Diagnosis not present

## 2023-11-08 DIAGNOSIS — I25118 Atherosclerotic heart disease of native coronary artery with other forms of angina pectoris: Secondary | ICD-10-CM | POA: Diagnosis not present

## 2023-11-08 DIAGNOSIS — R7989 Other specified abnormal findings of blood chemistry: Secondary | ICD-10-CM | POA: Diagnosis not present

## 2023-11-08 DIAGNOSIS — Z299 Encounter for prophylactic measures, unspecified: Secondary | ICD-10-CM | POA: Diagnosis not present

## 2023-11-08 DIAGNOSIS — K2270 Barrett's esophagus without dysplasia: Secondary | ICD-10-CM | POA: Diagnosis not present

## 2023-11-23 DIAGNOSIS — M9903 Segmental and somatic dysfunction of lumbar region: Secondary | ICD-10-CM | POA: Diagnosis not present

## 2023-11-23 DIAGNOSIS — M5441 Lumbago with sciatica, right side: Secondary | ICD-10-CM | POA: Diagnosis not present

## 2023-11-23 DIAGNOSIS — M47816 Spondylosis without myelopathy or radiculopathy, lumbar region: Secondary | ICD-10-CM | POA: Diagnosis not present

## 2023-11-29 DIAGNOSIS — M5441 Lumbago with sciatica, right side: Secondary | ICD-10-CM | POA: Diagnosis not present

## 2023-11-29 DIAGNOSIS — M47816 Spondylosis without myelopathy or radiculopathy, lumbar region: Secondary | ICD-10-CM | POA: Diagnosis not present

## 2023-11-29 DIAGNOSIS — M9903 Segmental and somatic dysfunction of lumbar region: Secondary | ICD-10-CM | POA: Diagnosis not present

## 2023-12-04 DIAGNOSIS — H25811 Combined forms of age-related cataract, right eye: Secondary | ICD-10-CM | POA: Diagnosis not present

## 2023-12-11 ENCOUNTER — Other Ambulatory Visit: Payer: Self-pay | Admitting: Nurse Practitioner

## 2023-12-11 DIAGNOSIS — M25551 Pain in right hip: Secondary | ICD-10-CM | POA: Diagnosis not present

## 2023-12-11 DIAGNOSIS — M48061 Spinal stenosis, lumbar region without neurogenic claudication: Secondary | ICD-10-CM | POA: Diagnosis not present

## 2023-12-11 DIAGNOSIS — M25552 Pain in left hip: Secondary | ICD-10-CM | POA: Diagnosis not present

## 2023-12-21 DIAGNOSIS — M545 Low back pain, unspecified: Secondary | ICD-10-CM | POA: Diagnosis not present

## 2024-02-07 DIAGNOSIS — M5416 Radiculopathy, lumbar region: Secondary | ICD-10-CM | POA: Diagnosis not present

## 2024-03-06 DIAGNOSIS — M5416 Radiculopathy, lumbar region: Secondary | ICD-10-CM | POA: Diagnosis not present

## 2024-04-02 DIAGNOSIS — R202 Paresthesia of skin: Secondary | ICD-10-CM | POA: Diagnosis not present

## 2024-04-02 DIAGNOSIS — R5383 Other fatigue: Secondary | ICD-10-CM | POA: Diagnosis not present

## 2024-04-02 DIAGNOSIS — Z79899 Other long term (current) drug therapy: Secondary | ICD-10-CM | POA: Diagnosis not present

## 2024-04-02 DIAGNOSIS — Z7189 Other specified counseling: Secondary | ICD-10-CM | POA: Diagnosis not present

## 2024-04-02 DIAGNOSIS — Z299 Encounter for prophylactic measures, unspecified: Secondary | ICD-10-CM | POA: Diagnosis not present

## 2024-04-02 DIAGNOSIS — Z1389 Encounter for screening for other disorder: Secondary | ICD-10-CM | POA: Diagnosis not present

## 2024-04-02 DIAGNOSIS — E7800 Pure hypercholesterolemia, unspecified: Secondary | ICD-10-CM | POA: Diagnosis not present

## 2024-04-02 DIAGNOSIS — M1990 Unspecified osteoarthritis, unspecified site: Secondary | ICD-10-CM | POA: Diagnosis not present

## 2024-04-02 DIAGNOSIS — I2510 Atherosclerotic heart disease of native coronary artery without angina pectoris: Secondary | ICD-10-CM | POA: Diagnosis not present

## 2024-04-02 DIAGNOSIS — Z0000 Encounter for general adult medical examination without abnormal findings: Secondary | ICD-10-CM | POA: Diagnosis not present

## 2024-05-01 DIAGNOSIS — I25118 Atherosclerotic heart disease of native coronary artery with other forms of angina pectoris: Secondary | ICD-10-CM | POA: Diagnosis not present

## 2024-05-02 DIAGNOSIS — M5416 Radiculopathy, lumbar region: Secondary | ICD-10-CM | POA: Diagnosis not present

## 2024-05-08 DIAGNOSIS — M549 Dorsalgia, unspecified: Secondary | ICD-10-CM | POA: Diagnosis not present

## 2024-05-08 DIAGNOSIS — Z299 Encounter for prophylactic measures, unspecified: Secondary | ICD-10-CM | POA: Diagnosis not present

## 2024-05-08 DIAGNOSIS — R29898 Other symptoms and signs involving the musculoskeletal system: Secondary | ICD-10-CM | POA: Diagnosis not present

## 2024-05-08 DIAGNOSIS — R52 Pain, unspecified: Secondary | ICD-10-CM | POA: Diagnosis not present

## 2024-05-08 DIAGNOSIS — R7989 Other specified abnormal findings of blood chemistry: Secondary | ICD-10-CM | POA: Diagnosis not present

## 2024-05-22 DIAGNOSIS — M5416 Radiculopathy, lumbar region: Secondary | ICD-10-CM | POA: Diagnosis not present

## 2024-05-27 DIAGNOSIS — M5416 Radiculopathy, lumbar region: Secondary | ICD-10-CM | POA: Diagnosis not present

## 2024-05-30 DIAGNOSIS — M5416 Radiculopathy, lumbar region: Secondary | ICD-10-CM | POA: Diagnosis not present

## 2024-06-03 DIAGNOSIS — M5416 Radiculopathy, lumbar region: Secondary | ICD-10-CM | POA: Diagnosis not present

## 2024-06-05 DIAGNOSIS — M5416 Radiculopathy, lumbar region: Secondary | ICD-10-CM | POA: Diagnosis not present

## 2024-06-10 ENCOUNTER — Ambulatory Visit: Attending: Cardiology | Admitting: Cardiology

## 2024-06-10 ENCOUNTER — Other Ambulatory Visit (HOSPITAL_BASED_OUTPATIENT_CLINIC_OR_DEPARTMENT_OTHER): Payer: Self-pay

## 2024-06-10 ENCOUNTER — Encounter: Payer: Self-pay | Admitting: Cardiology

## 2024-06-10 ENCOUNTER — Telehealth: Payer: Self-pay | Admitting: Pharmacy Technician

## 2024-06-10 VITALS — BP 142/68 | HR 80 | Ht 70.0 in | Wt 184.8 lb

## 2024-06-10 DIAGNOSIS — E782 Mixed hyperlipidemia: Secondary | ICD-10-CM

## 2024-06-10 DIAGNOSIS — I25119 Atherosclerotic heart disease of native coronary artery with unspecified angina pectoris: Secondary | ICD-10-CM

## 2024-06-10 DIAGNOSIS — M791 Myalgia, unspecified site: Secondary | ICD-10-CM | POA: Diagnosis not present

## 2024-06-10 DIAGNOSIS — T466X5D Adverse effect of antihyperlipidemic and antiarteriosclerotic drugs, subsequent encounter: Secondary | ICD-10-CM

## 2024-06-10 DIAGNOSIS — M5416 Radiculopathy, lumbar region: Secondary | ICD-10-CM | POA: Diagnosis not present

## 2024-06-10 MED ORDER — NITROGLYCERIN 0.4 MG SL SUBL
0.4000 mg | SUBLINGUAL_TABLET | SUBLINGUAL | 3 refills | Status: AC | PRN
  Filled 2024-06-10: qty 25, 8d supply, fill #0
  Filled 2024-07-11: qty 25, 8d supply, fill #1

## 2024-06-10 MED ORDER — SILDENAFIL CITRATE 100 MG PO TABS
100.0000 mg | ORAL_TABLET | Freq: Every day | ORAL | 2 refills | Status: AC | PRN
  Filled 2024-06-10: qty 20, 20d supply, fill #0
  Filled 2024-07-11: qty 20, 20d supply, fill #1

## 2024-06-10 MED ORDER — REPATHA SURECLICK 140 MG/ML ~~LOC~~ SOAJ
140.0000 mg | SUBCUTANEOUS | 2 refills | Status: AC
  Filled 2024-06-10 – 2024-07-29 (×2): qty 2, 28d supply, fill #0
  Filled 2024-08-21: qty 2, 28d supply, fill #1
  Filled 2024-09-17: qty 2, 28d supply, fill #2

## 2024-06-10 NOTE — Progress Notes (Signed)
    Cardiology Office Note  Date: 06/10/2024   ID: Perley, Arthurs 1951/05/04, MRN [id number]  History of Present Illness: [name] is a 73 y.o. male last seen in January.  He is here with his wife for a follow-up visit.  He does not report any angina or interval nitroglycerin  use, no change in stamina.  No palpitations or syncope.  We went over his medications.  He did not tolerate Nexlizet  reporting arthritic/back discomfort which is subsequently resolved off the medication.  Follow-up lipid panel in July showed LDL up to 138.  We did discuss Repatha again today and he is willing to give this a try.  I reviewed his ECG today which shows sinus rhythm with LVH and leftward axis.  Physical Exam: VS:  BP (!) 142/68 (BP Location: Left Arm)   Pulse 80   Ht 5' 10 (1.778 m)   Wt 184 lb 12.8 oz (83.8 kg)   SpO2 98%   BMI 26.52 kg/m , BMI Body mass index is 26.52 kg/m.  Wt Readings from Last 3 Encounters:  06/10/24 184 lb 12.8 oz (83.8 kg)  09/11/23 187 lb 3.2 oz (84.9 kg)  12/29/22 181 lb 12.8 oz (82.5 kg)    General: Patient appears comfortable at rest. HEENT: Conjunctiva and lids normal. Neck: Supple, no elevated JVP or carotid bruits. Lungs: Clear to auscultation, nonlabored breathing at rest. Cardiac: Regular rate and rhythm, no S3 or significant systolic murmur.  ECG:  An ECG dated 12/29/2022 was personally reviewed today and demonstrated:  Sinus rhythm with LVH and repolarization abnormalities.  Labwork:  July 2025: Cholesterol 234, triglycerides 66, HDL 85, LDL 138 August 2025: Hemoglobin 14.5, platelets 118, BUN 9, creatinine 0.77, GFR 96, potassium 4.5, AST 69, ALT 62  Other Studies Reviewed Today:  No interval cardiac testing for review today.  Assessment and Plan:  1.  CAD status post DES to the LAD in 2007 with D1 and D2 disease managed medically, ultimately underwent off-pump LIMA to LAD in April 2022.  LVEF 60 to 65%.  He does not report any angina  or interval nitroglycerin  use.  Continue aspirin  81 mg daily.   2.  Mixed hyperlipidemia.  He has a history of statin myalgias.  Did not tolerate Nexlizet .  LDL 138 in July.  He is in agreement to try Repatha, we will look into prior authorization.  If he tolerates this, can recheck lipid panel in 3 months.  Disposition:  Follow up 6 months.  Signed, [name], M.D., F.A.C.C. [facility]

## 2024-06-10 NOTE — Telephone Encounter (Signed)
    Pharmacy Patient Advocate Encounter   Received notification from CoverMyMeds that prior authorization for repatha is required/requested.   Insurance verification completed.   The patient is insured through [employer].   Per test claim: PA required; PA submitted to above mentioned insurance via Latent Key/confirmation #/EOC [id number] Status is pending

## 2024-06-10 NOTE — Patient Instructions (Addendum)
 Medication Instructions:  Your physician has recommended you make the following change in your medication:  Start Repatha 140 mg subcutaneous injections every 14 days Continue all other medications as prescribed  Labwork: FLP & LFT's in 3 months if you are able to tolerate repatha. (Contact office with an update). Please do not eat or drink for at least 8 hours when you have this done. You may take your medications that morning with a sip of water .  Testing/Procedures: none  Follow-Up: Your physician recommends that you schedule a follow-up appointment in: 6 months  Any Other Special Instructions Will Be Listed Below (If Applicable).  If you need a refill on your cardiac medications before your next appointment, please call your pharmacy.

## 2024-06-10 NOTE — Telephone Encounter (Signed)
 Pharmacy Patient Advocate Encounter  Received notification from [employer] that Prior Authorization for REPATHA has been APPROVED from 06/10/24 to 09/04/25   PA #/Case ID/Reference #: [id number]

## 2024-06-12 DIAGNOSIS — M5416 Radiculopathy, lumbar region: Secondary | ICD-10-CM | POA: Diagnosis not present

## 2024-07-11 ENCOUNTER — Other Ambulatory Visit: Payer: Self-pay | Admitting: Cardiology

## 2024-07-11 ENCOUNTER — Other Ambulatory Visit (HOSPITAL_BASED_OUTPATIENT_CLINIC_OR_DEPARTMENT_OTHER): Payer: Self-pay

## 2024-07-29 ENCOUNTER — Other Ambulatory Visit (HOSPITAL_COMMUNITY): Payer: Self-pay

## 2024-07-29 ENCOUNTER — Other Ambulatory Visit (HOSPITAL_BASED_OUTPATIENT_CLINIC_OR_DEPARTMENT_OTHER): Payer: Self-pay

## 2024-07-29 ENCOUNTER — Telehealth: Payer: Self-pay | Admitting: *Deleted

## 2024-07-29 ENCOUNTER — Telehealth: Payer: Self-pay | Admitting: Pharmacy Technician

## 2024-07-29 NOTE — Telephone Encounter (Signed)
-----   Message from Nurse Jinnie A sent at 06/10/2024  3:00 PM EDT ----- Regarding: if able to tolerate repatha , need flp and lft's in 3 mths from start Check to see if able to tolerate repatha  unless documented in chart already

## 2024-07-29 NOTE — Telephone Encounter (Addendum)
   Patient Advocate Encounter   The patient was approved for a [employer] grant that will help cover the cost of REPATHA  Total amount awarded, 2500 .  Effective: 06/29/24 - 06/28/25   APW:[id number] ERW:[id number] Hmnle:[id number] PI:[id number] [employer] ID: [id number]   Pharmacy provided with approval and processing information. Patient informed via mychart/telephone   Given to [employer]

## 2024-07-29 NOTE — Telephone Encounter (Signed)
 Per wife, repatha  prior auth approved but patient has not picked it up from the pharmacy.  Per wife, the co-pay is too expensive and would like to see if patient qualifies for a grant.  Advised that request will be sent to [facility] department to check qualification for grant.

## 2024-07-30 ENCOUNTER — Other Ambulatory Visit (HOSPITAL_BASED_OUTPATIENT_CLINIC_OR_DEPARTMENT_OTHER): Payer: Self-pay

## 2024-08-21 ENCOUNTER — Other Ambulatory Visit (HOSPITAL_BASED_OUTPATIENT_CLINIC_OR_DEPARTMENT_OTHER): Payer: Self-pay

## 2024-09-10 ENCOUNTER — Other Ambulatory Visit (HOSPITAL_COMMUNITY): Payer: Self-pay

## 2024-09-10 ENCOUNTER — Telehealth: Payer: Self-pay | Admitting: Pharmacy Technician

## 2024-09-10 NOTE — Telephone Encounter (Signed)
 Wife called and gave new insurance info. Repatha  goes through on new ins. He has [employer] grant in [employer]

## 2024-09-17 ENCOUNTER — Other Ambulatory Visit (HOSPITAL_BASED_OUTPATIENT_CLINIC_OR_DEPARTMENT_OTHER): Payer: Self-pay

## 2024-11-27 ENCOUNTER — Ambulatory Visit: Payer: Medicare (Managed Care) | Admitting: Cardiology
